# Patient Record
Sex: Male | Born: 1965 | Race: White | Hispanic: No | Marital: Married | Smoking: Former smoker
Health system: Southern US, Community
[De-identification: ages and names within clinical notes are randomized; demographics above are authoritative.]

## PROBLEM LIST (undated history)

## (undated) DIAGNOSIS — K219 Gastro-esophageal reflux disease without esophagitis: Secondary | ICD-10-CM

## (undated) DIAGNOSIS — E785 Hyperlipidemia, unspecified: Secondary | ICD-10-CM

## (undated) DIAGNOSIS — E119 Type 2 diabetes mellitus without complications: Secondary | ICD-10-CM

## (undated) DIAGNOSIS — F988 Other specified behavioral and emotional disorders with onset usually occurring in childhood and adolescence: Secondary | ICD-10-CM

## (undated) DIAGNOSIS — F32A Depression, unspecified: Secondary | ICD-10-CM

## (undated) DIAGNOSIS — E079 Disorder of thyroid, unspecified: Secondary | ICD-10-CM

## (undated) DIAGNOSIS — N189 Chronic kidney disease, unspecified: Secondary | ICD-10-CM

## (undated) DIAGNOSIS — F419 Anxiety disorder, unspecified: Secondary | ICD-10-CM

## (undated) DIAGNOSIS — T7840XA Allergy, unspecified, initial encounter: Secondary | ICD-10-CM

## (undated) DIAGNOSIS — G4730 Sleep apnea, unspecified: Secondary | ICD-10-CM

## (undated) DIAGNOSIS — F329 Major depressive disorder, single episode, unspecified: Secondary | ICD-10-CM

## (undated) DIAGNOSIS — I10 Essential (primary) hypertension: Secondary | ICD-10-CM

## (undated) HISTORY — PX: NECK SURGERY: SHX720

## (undated) HISTORY — DX: Sleep apnea, unspecified: G47.30

## (undated) HISTORY — DX: Type 2 diabetes mellitus without complications: E11.9

## (undated) HISTORY — DX: Disorder of thyroid, unspecified: E07.9

## (undated) HISTORY — DX: Other specified behavioral and emotional disorders with onset usually occurring in childhood and adolescence: F98.8

## (undated) HISTORY — DX: Gastro-esophageal reflux disease without esophagitis: K21.9

## (undated) HISTORY — DX: Hyperlipidemia, unspecified: E78.5

## (undated) HISTORY — DX: Anxiety disorder, unspecified: F41.9

## (undated) HISTORY — PX: NO PAST SURGERIES: SHX2092

## (undated) HISTORY — DX: Allergy, unspecified, initial encounter: T78.40XA

## (undated) HISTORY — DX: Chronic kidney disease, unspecified: N18.9

## (undated) HISTORY — DX: Essential (primary) hypertension: I10

---

## 1999-04-11 ENCOUNTER — Encounter: Admission: RE | Admit: 1999-04-11 | Discharge: 1999-07-10 | Payer: Self-pay | Admitting: Family Medicine

## 2001-06-06 ENCOUNTER — Emergency Department (HOSPITAL_COMMUNITY): Admission: EM | Admit: 2001-06-06 | Discharge: 2001-06-06 | Payer: Self-pay

## 2004-01-04 ENCOUNTER — Ambulatory Visit (HOSPITAL_COMMUNITY): Admission: RE | Admit: 2004-01-04 | Discharge: 2004-01-04 | Payer: Self-pay | Admitting: Gastroenterology

## 2006-11-18 ENCOUNTER — Ambulatory Visit: Payer: Self-pay | Admitting: Family Medicine

## 2007-04-09 ENCOUNTER — Ambulatory Visit: Payer: Self-pay | Admitting: Family Medicine

## 2007-05-26 ENCOUNTER — Ambulatory Visit: Payer: Self-pay | Admitting: Family Medicine

## 2007-07-09 ENCOUNTER — Ambulatory Visit (HOSPITAL_BASED_OUTPATIENT_CLINIC_OR_DEPARTMENT_OTHER): Admission: RE | Admit: 2007-07-09 | Discharge: 2007-07-09 | Payer: Self-pay | Admitting: Family Medicine

## 2007-07-15 ENCOUNTER — Ambulatory Visit: Payer: Self-pay | Admitting: Pulmonary Disease

## 2008-02-16 ENCOUNTER — Ambulatory Visit: Payer: Self-pay | Admitting: Family Medicine

## 2008-05-09 ENCOUNTER — Ambulatory Visit: Payer: Self-pay | Admitting: Family Medicine

## 2008-05-18 ENCOUNTER — Ambulatory Visit: Payer: Self-pay | Admitting: Family Medicine

## 2008-05-20 ENCOUNTER — Ambulatory Visit: Payer: Self-pay | Admitting: Family Medicine

## 2008-07-04 ENCOUNTER — Ambulatory Visit: Payer: Self-pay | Admitting: Family Medicine

## 2008-08-01 ENCOUNTER — Ambulatory Visit: Payer: Self-pay | Admitting: Family Medicine

## 2008-09-05 ENCOUNTER — Ambulatory Visit: Payer: Self-pay | Admitting: Family Medicine

## 2009-04-06 ENCOUNTER — Ambulatory Visit: Payer: Self-pay | Admitting: Family Medicine

## 2009-10-04 ENCOUNTER — Ambulatory Visit: Payer: Self-pay | Admitting: Family Medicine

## 2010-07-23 ENCOUNTER — Ambulatory Visit: Payer: Self-pay | Admitting: Family Medicine

## 2010-07-23 ENCOUNTER — Encounter: Admission: RE | Admit: 2010-07-23 | Discharge: 2010-07-23 | Payer: Self-pay | Admitting: Family Medicine

## 2011-01-15 ENCOUNTER — Ambulatory Visit
Admission: RE | Admit: 2011-01-15 | Discharge: 2011-01-15 | Payer: Self-pay | Source: Home / Self Care | Attending: Family Medicine | Admitting: Family Medicine

## 2011-01-30 ENCOUNTER — Emergency Department (HOSPITAL_COMMUNITY): Payer: No Typology Code available for payment source

## 2011-01-30 ENCOUNTER — Emergency Department (HOSPITAL_COMMUNITY)
Admission: EM | Admit: 2011-01-30 | Discharge: 2011-01-31 | Disposition: A | Discharge status: Home / Self Care | Payer: No Typology Code available for payment source | Attending: Emergency Medicine | Admitting: Emergency Medicine

## 2011-01-30 DIAGNOSIS — S62309A Unspecified fracture of unspecified metacarpal bone, initial encounter for closed fracture: Secondary | ICD-10-CM | POA: Insufficient documentation

## 2011-01-30 DIAGNOSIS — S0990XA Unspecified injury of head, initial encounter: Secondary | ICD-10-CM | POA: Insufficient documentation

## 2011-01-30 DIAGNOSIS — M542 Cervicalgia: Secondary | ICD-10-CM | POA: Insufficient documentation

## 2011-01-30 DIAGNOSIS — S0100XA Unspecified open wound of scalp, initial encounter: Secondary | ICD-10-CM | POA: Insufficient documentation

## 2011-01-30 DIAGNOSIS — T148XXA Other injury of unspecified body region, initial encounter: Secondary | ICD-10-CM | POA: Insufficient documentation

## 2011-01-30 DIAGNOSIS — R51 Headache: Secondary | ICD-10-CM | POA: Insufficient documentation

## 2011-01-30 DIAGNOSIS — W108XXA Fall (on) (from) other stairs and steps, initial encounter: Secondary | ICD-10-CM | POA: Insufficient documentation

## 2011-01-30 DIAGNOSIS — IMO0002 Reserved for concepts with insufficient information to code with codable children: Secondary | ICD-10-CM | POA: Insufficient documentation

## 2011-03-19 ENCOUNTER — Ambulatory Visit (INDEPENDENT_AMBULATORY_CARE_PROVIDER_SITE_OTHER): Payer: No Typology Code available for payment source | Admitting: Family Medicine

## 2011-03-19 DIAGNOSIS — M542 Cervicalgia: Secondary | ICD-10-CM

## 2011-03-21 ENCOUNTER — Ambulatory Visit
Admission: RE | Admit: 2011-03-21 | Discharge: 2011-03-21 | Disposition: A | Discharge status: Home / Self Care | Payer: No Typology Code available for payment source | Source: Ambulatory Visit | Attending: Family Medicine | Admitting: Family Medicine

## 2011-03-21 ENCOUNTER — Other Ambulatory Visit: Payer: Self-pay | Admitting: Family Medicine

## 2011-03-21 DIAGNOSIS — M542 Cervicalgia: Secondary | ICD-10-CM

## 2011-04-16 ENCOUNTER — Ambulatory Visit (INDEPENDENT_AMBULATORY_CARE_PROVIDER_SITE_OTHER): Payer: BC Managed Care – PPO | Admitting: Family Medicine

## 2011-04-16 DIAGNOSIS — Z79899 Other long term (current) drug therapy: Secondary | ICD-10-CM

## 2011-04-16 DIAGNOSIS — I10 Essential (primary) hypertension: Secondary | ICD-10-CM

## 2011-04-16 DIAGNOSIS — Z7189 Other specified counseling: Secondary | ICD-10-CM

## 2011-04-22 ENCOUNTER — Encounter: Payer: Self-pay | Admitting: Family Medicine

## 2011-05-07 NOTE — Procedures (Signed)
[name], Russell Peters              ACCOUNT NO.:  [id number]   MEDICAL RECORD NO.:  [id number]          PATIENT TYPE:  OUT   LOCATION:  SLEEP CENTER                 FACILITY:  [facility]   PHYSICIAN:  Barbaraann Share, MD,FCCPDATE OF BIRTH:  1967/07/27   DATE OF STUDY:  07/09/2007                            NOCTURNAL POLYSOMNOGRAM   REFERRING PHYSICIAN:  Sharlot Gowda, M.D.   INDICATION FOR STUDY:  Hypersomnia with sleep apnea.   EPWORTH SLEEPINESS SCORE:  Epworth score:  21.   SLEEP ARCHITECTURE:  The patient had total sleep time of 422 minutes  with very little slow wave sleep or REM.  Sleep onset latency was normal  and REM onset was very prolonged at 308 minutes.  Sleep efficiency was  surprisingly good at 91%.   RESPIRATORY DATA:  The patient underwent split-night protocol where he  was found to have 200 obstructive events in the first 122 minutes of  sleep.  This gave him an extrapolated apnea/hypopnea index of 98 events  per hour.  The events occurred primarily in the supine position.  There  was loud snoring noted throughout.  By protocol, the patient was placed  on a medium Mirage Quattro full face mask and ultimately titrated to a  final pressure of 13 cm of water with excellent control.   OXYGEN DATA:  There was O2 desaturation as low as 83% with the patient's  obstructive events prior to CPAP initiation.   CARDIAC DATA:  No clinically significant arrhythmias were noted.   MOVEMENT-PARASOMNIA:  None.   IMPRESSIONS-RECOMMENDATIONS:  Very severe obstructive sleep  apnea/hypopnea syndrome with an apnea/hypopnea index with 98 events per  hour and O2 desaturation as low as 83% during the first half of the  night.  The patient was then placed on CPAP with a medium Mirage Quattro  full face mask and ultimately titrated to a final pressure of 13 cm with  excellent control.     Barbaraann Share, MD,FCCP  Diplomate, American Board of Sleep  Medicine  Electronically Signed    [facility]/MEDQ  D:  07/15/2007 17:03:09  T:  07/16/2007 09:16:55  Job:  [id number]

## 2011-05-10 NOTE — Op Note (Signed)
NAME:  Russell Peters                        ACCOUNT NO.:  [id number]   MEDICAL RECORD NO.:  [id number]                   PATIENT TYPE:  AMB   LOCATION:  ENDO                                 FACILITY:  [facility]   PHYSICIAN:  Russell Peters, M.D.                DATE OF BIRTH:  08/04/1966   DATE OF PROCEDURE:  01/04/2004  DATE OF DISCHARGE:                                 OPERATIVE REPORT   PROCEDURE:  Esophagogastroduodenoscopy.   INDICATIONS:  Russell Peters is a 45 year old male, born October 31, 1966.  Russell Peters takes a baby aspirin daily to prevent heart attacks and  strokes.  When he developed heartburn and indigestion, Russell Peters was  evaluated by Dr. Sharlot Peters.  Russell Peters was passing dark stools but by  Dr. Jola Peters exam, his stool was guaiac-negative and his hemoglobin was  measured at 15.1 g.  His complete metabolic profile was normal except for  slight elevation in the liver transaminases.  He was placed on Protonix with  almost complete resolution in his heartburn/indigestion symptoms.  He denies  nausea, vomiting, hematemesis, or hematochezia.   MEDICATION ALLERGIES:  PENICILLIN.   CHRONIC MEDICATIONS:  Wellbutrin, Celexa, Allegra, Adderall, baby aspirin,  antacid p.r.n.   HABITS:  Russell Peters does not smoke cigarettes.  He consumes alcohol in  moderation.   ENDOSCOPIST:  Russell Peters, M.D.   PREMEDICATION:  Versed 10 mg, Demerol 50 mg.   PROCEDURE:  After obtaining informed consent, Russell Peters was placed on the  left lateral decubitus position.  I administered intravenous Demerol and  intravenous Versed to achieve conscious sedation for the procedure.  The  patient's blood pressure, oxygen saturation and cardiac rhythm were  monitored throughout the procedure and documented in the medical record.   The Olympus gastroscope was passed through the posterior hypopharynx down to  the proximal esophagus without difficulty.  The hypopharynx, larynx and  vocal cords appeared normal.   ESOPHAGOSCOPY:  The proximal, mid and lower segments of the esophageal  mucosa appear completely normal.  The squamocolumnar junction and  esophagogastric junction are noted at 45 cm from the incisor teeth.   GASTROSCOPY:  Retroflexed view of the gastric cardia and fundus was normal.  The gastric body, antrum and pylorus appeared normal.   DUODENOSCOPY:  The duodenal bulb, mid duodenum and distal duodenum appeared  completely normal.   ASSESSMENT:  Normal esophagogastroduodenoscopy.   RECOMMENDATIONS:  Discontinue proton pump inhibitor therapy.  I would  recommend not restarting aspirin.  If heartburn and indigestion return,  restart proton pump inhibitor indefinitely to treat gastroesophageal reflux.                                               Russell Peters, M.D.    MJ/MEDQ  D:  01/04/2004  T:  01/04/2004  Job:  [id number]   cc:   Russell Peters, M.D.  [address]  [place], [place] [address]  Fax: [phone]

## 2011-05-16 ENCOUNTER — Ambulatory Visit: Payer: BC Managed Care – PPO | Admitting: Family Medicine

## 2011-05-16 ENCOUNTER — Encounter: Payer: Self-pay | Admitting: Family Medicine

## 2011-05-16 ENCOUNTER — Ambulatory Visit (INDEPENDENT_AMBULATORY_CARE_PROVIDER_SITE_OTHER): Payer: BC Managed Care – PPO | Admitting: Family Medicine

## 2011-05-16 VITALS — BP 140/92 | HR 87 | Wt 281.0 lb

## 2011-05-16 DIAGNOSIS — I152 Hypertension secondary to endocrine disorders: Secondary | ICD-10-CM | POA: Insufficient documentation

## 2011-05-16 DIAGNOSIS — E039 Hypothyroidism, unspecified: Secondary | ICD-10-CM | POA: Insufficient documentation

## 2011-05-16 DIAGNOSIS — E66812 Obesity, class 2: Secondary | ICD-10-CM | POA: Insufficient documentation

## 2011-05-16 DIAGNOSIS — I10 Essential (primary) hypertension: Secondary | ICD-10-CM

## 2011-05-16 DIAGNOSIS — E669 Obesity, unspecified: Secondary | ICD-10-CM

## 2011-05-16 LAB — TSH: TSH: 3.447 u[IU]/mL (ref 0.350–4.500)

## 2011-05-16 MED ORDER — LISINOPRIL-HYDROCHLOROTHIAZIDE 10-12.5 MG PO TABS: 1.0000 | ORAL_TABLET | Freq: Every day | ORAL | Status: DC

## 2011-05-16 NOTE — Progress Notes (Signed)
  Subjective:    Patient ID: Russell Peters, male    DOB: 1966/08/03, 45 y.o.   MRN: [id number]  HPI he is here for medication recheck. In January he was placed on a higher dose of Synthroid. In April he was placed on HCTZ to help with his blood pressure. He hasn't he has noted increase in his urination. He also has concerns over his weight.  He is also have him difficulty with some left arm pain radiating to his fourth and fifth fingers. The MRI did show bilateral changes in C5-6 and C6-7   Review of Systems     Objective:   Physical Exam alert and in no distress. Healing surgical scar noted on the right neck area.        Assessment & Plan:  Hypertension. Hypothyroid. Obesity. Diet and exercise were discussed in detail. He will try an [place] program that will work well with his underlying personality. He does need a program that has written out manuscript he will be able to follow easier because of his underlying ADD I will increase his blood pressure medicine to lisinopril/HCTZ Recheck here in one month. TSH He is to discuss his left arm symptoms with his neurosurgeon

## 2011-05-16 NOTE — Patient Instructions (Signed)
Using information from the Internet to help with the weight. Also increase her physical activity even if this is 5 minutes at a time. Return here in one month for recheck on your blood pressure. I will call in the new blood pressure medicine.

## 2011-05-21 ENCOUNTER — Telehealth: Payer: Self-pay

## 2011-05-21 NOTE — Telephone Encounter (Signed)
Had to call pt on cell he is aware of lab

## 2011-05-21 NOTE — Telephone Encounter (Signed)
Called pt to inform tsh is good continue on present meds

## 2011-05-24 ENCOUNTER — Other Ambulatory Visit: Payer: Self-pay | Admitting: Family Medicine

## 2011-06-24 ENCOUNTER — Ambulatory Visit (INDEPENDENT_AMBULATORY_CARE_PROVIDER_SITE_OTHER): Payer: BC Managed Care – PPO | Admitting: Family Medicine

## 2011-06-24 ENCOUNTER — Encounter: Payer: Self-pay | Admitting: Family Medicine

## 2011-06-24 VITALS — BP 102/76 | HR 78 | Wt 278.0 lb

## 2011-06-24 DIAGNOSIS — N539 Unspecified male sexual dysfunction: Secondary | ICD-10-CM

## 2011-06-24 DIAGNOSIS — F529 Unspecified sexual dysfunction not due to a substance or known physiological condition: Secondary | ICD-10-CM

## 2011-06-24 DIAGNOSIS — I10 Essential (primary) hypertension: Secondary | ICD-10-CM

## 2011-06-24 DIAGNOSIS — M7710 Lateral epicondylitis, unspecified elbow: Secondary | ICD-10-CM

## 2011-06-24 DIAGNOSIS — M7712 Lateral epicondylitis, left elbow: Secondary | ICD-10-CM

## 2011-06-24 NOTE — Patient Instructions (Addendum)
Taper from 40 mg down to 20 mg and then 10 mg every week or 2. They on the Wellbutrin. Get into counseling. Let me know how you are doing in a month or 2 Do as many things that you can palms up and open

## 2011-06-24 NOTE — Progress Notes (Signed)
  Subjective:    Patient ID: Russell Peters, male    DOB: 02-17-1966, 45 y.o.   MRN: [id number]  HPI He is here for recheck on his blood pressure. He has not gotten involved in counseling yet but does state that he is feeling better. He also has concerns over sexual function issues. He notes that he has had difficulty with ejaculation. This has been going on about the same time that he has been on his Celexa. He is also had some recent difficulty with left elbow pain. He especially notes this in the pronated position .  Review of Systems     Objective:   Physical Exam Alert and in no distress. Blood pressure is recorded. Exam of the left elbow does show full motion with slight tenderness over the lateral epicondyle.       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 Hypertension. Lateral epicondylitis. He will slowly taper off Celexa and see how this helps with his sexual function. Discussed proper care of his elbow in terms of hand and wrist position. He will let he know how coming off the Celexa works. He is continue on his blood pressure medicine.

## 2011-12-18 ENCOUNTER — Other Ambulatory Visit: Payer: Self-pay | Admitting: Family Medicine

## 2012-03-24 ENCOUNTER — Encounter: Payer: Self-pay | Admitting: Family Medicine

## 2012-03-24 ENCOUNTER — Ambulatory Visit (INDEPENDENT_AMBULATORY_CARE_PROVIDER_SITE_OTHER): Payer: BC Managed Care – PPO | Admitting: Family Medicine

## 2012-03-24 VITALS — BP 146/96 | HR 90 | Wt 260.0 lb

## 2012-03-24 DIAGNOSIS — M461 Sacroiliitis, not elsewhere classified: Secondary | ICD-10-CM

## 2012-03-24 DIAGNOSIS — N529 Male erectile dysfunction, unspecified: Secondary | ICD-10-CM

## 2012-03-24 MED ORDER — TRAMADOL HCL 50 MG PO TABS: 50.0000 mg | ORAL_TABLET | Freq: Three times a day (TID) | ORAL | Status: AC | PRN

## 2012-03-24 MED ORDER — VARDENAFIL HCL 20 MG PO TABS: 20.0000 mg | ORAL_TABLET | Freq: Every day | ORAL | Status: DC | PRN

## 2012-03-24 NOTE — Patient Instructions (Addendum)
Heat for 20 minutes 3 times per day. Stretch after you apply the heat Continue with ibuprofen 800 mg 3 times per day and give you tramadol. Call me when you need a refill on Levitra

## 2012-03-24 NOTE — Progress Notes (Signed)
  Subjective:    Patient ID: Russell Peters, male    DOB: 22-Sep-1966, 46 y.o.   MRN: [id number]  HPI Approximately 10 days ago he experienced the onset of low back pain while moving a heavy object while at work. Approximately one week ago he had worsening of his symptoms with spasm. He has been using heat alternating with ice as well as ibuprofen and did get better. He reinjured his back while playing basketball 2 days ago and continues to have spasm and pain. No numbness, tingling or weakness. He has been taking 800 mg ibuprofen 3 or 4 times per day. He also has been having difficulty maintaining erections. He has no problems getting started but has had difficulty with ejaculation.   Review of Systems     Objective:   Physical Exam Tender to palpation over the right SI joint. Normal lumbar curve with good motion. Pearlean Brownie testing is positive. DTRs normal.       Assessment & Plan:   1. ED (erectile dysfunction)   2. Sacroiliitis    a sample of Levitra given with instructions on proper use and possible side effects. He will let me know if this works. Also recommend heat for 20 minutes, stretching exercises and I will give tramadol as well as continue him on anti-inflammatory. He will let me know how this works.

## 2012-03-27 ENCOUNTER — Telehealth: Payer: Self-pay | Admitting: Internal Medicine

## 2012-03-27 ENCOUNTER — Telehealth: Payer: Self-pay | Admitting: Family Medicine

## 2012-03-27 ENCOUNTER — Other Ambulatory Visit: Payer: Self-pay | Admitting: Medical

## 2012-03-27 MED ORDER — TRAMADOL HCL 50 MG PO TABS: ORAL_TABLET | ORAL | Status: DC

## 2012-03-27 NOTE — Telephone Encounter (Signed)
i reviewed chart from his recent visit.  I assume this is in regards to the back pain/sacroiliitis.  If so, rest, c/t the antiinflammatory, and he can use 1-2 Ultram every 6 hours prn for pain the next day or 2.  No lifting, just use rest and take it easy over the weekend.  Call report or recheck next week.

## 2012-03-27 NOTE — Telephone Encounter (Signed)
Called pt and advised of Russell Peters's advice.

## 2012-03-27 NOTE — Telephone Encounter (Signed)
Ultram refill sent.  F/u here next week if worse or not much improved

## 2012-03-27 NOTE — Telephone Encounter (Signed)
PT INFORMED OF SHANE'S NOTE & REFILL

## 2012-05-24 ENCOUNTER — Other Ambulatory Visit: Payer: Self-pay | Admitting: Family Medicine

## 2012-06-05 ENCOUNTER — Other Ambulatory Visit: Payer: Self-pay | Admitting: Family Medicine

## 2012-10-20 ENCOUNTER — Encounter: Payer: Self-pay | Admitting: Family Medicine

## 2012-10-20 ENCOUNTER — Ambulatory Visit (INDEPENDENT_AMBULATORY_CARE_PROVIDER_SITE_OTHER): Payer: BC Managed Care – PPO | Admitting: Family Medicine

## 2012-10-20 VITALS — BP 130/90 | HR 87 | Wt 270.0 lb

## 2012-10-20 DIAGNOSIS — E039 Hypothyroidism, unspecified: Secondary | ICD-10-CM

## 2012-10-20 DIAGNOSIS — E669 Obesity, unspecified: Secondary | ICD-10-CM

## 2012-10-20 DIAGNOSIS — G3184 Mild cognitive impairment, so stated: Secondary | ICD-10-CM

## 2012-10-20 LAB — COMPREHENSIVE METABOLIC PANEL
Albumin: 4.3 g/dL (ref 3.5–5.2)
Alkaline Phosphatase: 48 U/L (ref 39–117)
BUN: 14 mg/dL (ref 6–23)
Calcium: 9.7 mg/dL (ref 8.4–10.5)
Chloride: 106 mEq/L (ref 96–112)
Glucose, Bld: 117 mg/dL — ABNORMAL HIGH (ref 70–99)
Potassium: 4.5 mEq/L (ref 3.5–5.3)

## 2012-10-20 LAB — CBC WITH DIFFERENTIAL/PLATELET
Basophils Relative: 0 % (ref 0–1)
HCT: 42.4 % (ref 39.0–52.0)
Hemoglobin: 14.5 g/dL (ref 13.0–17.0)
MCHC: 34.2 g/dL (ref 30.0–36.0)
MCV: 92.2 fL (ref 78.0–100.0)
Monocytes Absolute: 0.7 10*3/uL (ref 0.1–1.0)
Monocytes Relative: 10 % (ref 3–12)
Neutro Abs: 4 10*3/uL (ref 1.7–7.7)

## 2012-10-20 NOTE — Progress Notes (Signed)
  Subjective:    Patient ID: Russell Peters, male    DOB: [birth year], 46 y.o.   MRN: [id number]  HPI Is here for consult concerning cognitive difficulties. He relates inability to add appropriately, difficulty with names, dates and places. He relates one situation where he tried to put a shirt on to his legs. He dates this to a head injury. His wife also mentions paranoid ideation. He relates more difficulty with headaches and does describe 2 incidents of falling over with no good reason. He states that he would like to come off all of his medications.   Review of Systems     Objective:   Physical Exam alert and in no distress. EOMI. DTRs normal. No clonus noted. Tympanic membranes and canals are normal. Throat is clear. Tonsils are normal. Neck is supple without adenopathy or thyromegaly. Cardiac exam shows a regular sinus rhythm without murmurs or gallops. Lungs are clear to auscultation.   Cerebellar testing did show some slight instability but he did not fall in one direction. Finger to nose was slightly off.    Assessment & Plan:   1. Mild cognitive impairment  CBC with Differential, Comprehensive metabolic panel, TSH, CT Head W Contrast  2. Hypothyroid  TSH  3. Obesity     discussed followup concerning this. If his testing all comes out negative. I will have him see his psychiatrist again for discussion on possible stopping medications or switching. May need also look from a neurologic perspective.

## 2012-10-21 ENCOUNTER — Encounter: Payer: Self-pay | Admitting: Family Medicine

## 2012-10-21 NOTE — Addendum Note (Signed)
Addended by: Janeice Robinson on: 10/21/2012 09:52 AM   Modules accepted: Orders

## 2012-10-22 ENCOUNTER — Inpatient Hospital Stay: Admission: RE | Admit: 2012-10-22 | Payer: BC Managed Care – PPO | Source: Ambulatory Visit

## 2012-10-22 ENCOUNTER — Ambulatory Visit
Admission: RE | Admit: 2012-10-22 | Discharge: 2012-10-22 | Disposition: A | Discharge status: Home / Self Care | Payer: BC Managed Care – PPO | Source: Ambulatory Visit | Attending: Family Medicine | Admitting: Family Medicine

## 2012-10-22 DIAGNOSIS — E039 Hypothyroidism, unspecified: Secondary | ICD-10-CM

## 2012-10-22 DIAGNOSIS — G3184 Mild cognitive impairment, so stated: Secondary | ICD-10-CM

## 2012-10-22 MED ORDER — IOHEXOL 300 MG/ML  SOLN
75.0000 mL | Freq: Once | INTRAMUSCULAR | Status: AC | PRN
  Administered 2012-10-22: 75 mL via INTRAVENOUS

## 2012-10-22 NOTE — Progress Notes (Signed)
Quick Note:  I called to let him know the results of the blood work and CT scan. I will await the results of the CPAP readout. If that is normal, I recommended that he talk to his psychiatrist and make sure that the psychiatrist tends me a note. We might need neurologic evaluation. ______

## 2012-10-22 NOTE — Telephone Encounter (Signed)
Thank you I will get to work on this .

## 2012-10-29 ENCOUNTER — Telehealth: Payer: Self-pay

## 2012-10-29 NOTE — Telephone Encounter (Signed)
Opened in error

## 2012-12-24 ENCOUNTER — Telehealth: Payer: Self-pay | Admitting: Family Medicine

## 2012-12-28 ENCOUNTER — Telehealth: Payer: Self-pay | Admitting: Family Medicine

## 2012-12-28 NOTE — Telephone Encounter (Signed)
CALLED [facility]  THEY SAID THEY NEED THE SETTINGS ON RX

## 2012-12-28 NOTE — Telephone Encounter (Signed)
Pt stopped by and stated that he talked to [facility] and they stated that the wrong rx for his cpap machine was sent in . Please send correct rx to [facility].

## 2012-12-30 NOTE — Telephone Encounter (Signed)
tsd  °

## 2013-02-01 ENCOUNTER — Other Ambulatory Visit: Payer: Self-pay | Admitting: Family Medicine

## 2013-02-06 ENCOUNTER — Other Ambulatory Visit: Payer: Self-pay

## 2013-03-29 ENCOUNTER — Other Ambulatory Visit: Payer: Self-pay | Admitting: Family Medicine

## 2013-05-20 ENCOUNTER — Telehealth: Payer: Self-pay | Admitting: Family Medicine

## 2013-05-20 MED ORDER — SYNTHROID 88 MCG PO TABS: ORAL_TABLET | ORAL | Status: DC

## 2013-05-20 NOTE — Telephone Encounter (Signed)
SENT IN [facility] MED PER FAX

## 2013-05-27 ENCOUNTER — Other Ambulatory Visit: Payer: Self-pay | Admitting: Family Medicine

## 2013-07-15 ENCOUNTER — Telehealth: Payer: Self-pay | Admitting: Internal Medicine

## 2013-07-15 MED ORDER — SYNTHROID 88 MCG PO TABS: ORAL_TABLET | ORAL | Status: DC

## 2013-07-15 NOTE — Telephone Encounter (Signed)
Done

## 2013-07-15 NOTE — Telephone Encounter (Signed)
Refill request for synthroid 88mcg to [facility] 

## 2013-07-21 ENCOUNTER — Encounter: Payer: Self-pay | Admitting: Family Medicine

## 2013-07-21 ENCOUNTER — Ambulatory Visit (INDEPENDENT_AMBULATORY_CARE_PROVIDER_SITE_OTHER): Payer: BC Managed Care – PPO | Admitting: Family Medicine

## 2013-07-21 VITALS — BP 130/82 | HR 99 | Wt 268.0 lb

## 2013-07-21 DIAGNOSIS — L919 Hypertrophic disorder of the skin, unspecified: Secondary | ICD-10-CM

## 2013-07-21 DIAGNOSIS — L918 Other hypertrophic disorders of the skin: Secondary | ICD-10-CM

## 2013-07-21 DIAGNOSIS — L909 Atrophic disorder of skin, unspecified: Secondary | ICD-10-CM

## 2013-07-21 MED ORDER — LIDOCAINE-EPINEPHRINE 2 %-1:100000 IJ SOLN
0.2000 mL | Freq: Once | INTRAMUSCULAR | Status: AC
  Administered 2013-07-21: 0.2 mL via INTRADERMAL

## 2013-07-21 NOTE — Addendum Note (Signed)
Addended by: Lavell Islam on: 07/21/2013 10:15 AM   Modules accepted: Orders

## 2013-07-21 NOTE — Progress Notes (Signed)
  Subjective:    Patient ID: Russell Peters, male    DOB: 1966-05-21, 47 y.o.   MRN: [id number]  HPI He has 2 skin tags present, one on each inner thigh that are now becoming irritated with wearing clothes and he would like them removed.  Review of Systems     Objective:   Physical Exam The left skin tag is approximately 0.5 cm in size and the one on the right is slightly smaller.       Assessment & Plan:  Cutaneous skin tags  lesions were injected with Xylocaine and epinephrine, excised and the base cauterized with silver nitrate.

## 2013-10-09 ENCOUNTER — Other Ambulatory Visit: Payer: Self-pay | Admitting: Family Medicine

## 2013-10-28 ENCOUNTER — Other Ambulatory Visit: Payer: Self-pay

## 2013-11-22 ENCOUNTER — Ambulatory Visit (INDEPENDENT_AMBULATORY_CARE_PROVIDER_SITE_OTHER): Payer: BC Managed Care – PPO | Admitting: Family Medicine

## 2013-11-22 ENCOUNTER — Encounter: Payer: Self-pay | Admitting: Family Medicine

## 2013-11-22 VITALS — BP 130/90 | HR 119 | Temp 98.2°F | Wt 264.0 lb

## 2013-11-22 DIAGNOSIS — J209 Acute bronchitis, unspecified: Secondary | ICD-10-CM

## 2013-11-22 MED ORDER — AZITHROMYCIN 500 MG PO TABS: 500.0000 mg | ORAL_TABLET | Freq: Every day | ORAL | Status: DC

## 2013-11-22 NOTE — Progress Notes (Signed)
Teaching Physician: Sharlot Gowda, MD Dictated By: Judithann Graves  Subjective:  Russell Peters is a 47 y.o. male who presents for evaluation of 8 days of cough, sinus congestion, and headache. The cough was initially dry, becoming productive of yellow-green sputum on the 4th day of his illness. It is occasionally blood-tinged. The cough is worse at night and lately only achieving about 2 hours of sleep each night. He has tried a cough suppressant and Tylenol Cold Extreme with only minimal symptomatic relief. Of note, he also describes some blisters that he noticed on his tongue when this episode began but they have since resolved. He believes that his cough is not improving. He has not had a fever but he continues to have chills at night. He has no known sick contacts, chest pain, SOB, wheezing, earache, sore throat. He is a present nonsmoker with 8-9 pack-year smoking history, having quit 15 years ago.  ROS as in subjective.  Objective: Filed Vitals:   11/22/13 1055  BP: 130/90  Pulse: 119  Temp: 98.2 F (36.8 C)    Physical Exam:  General: Alert and in no distress  HEENT: Tympanic membranes and canals are normal. Throat is clear. Tonsils are normal.  Neck: Supple without adenopathy or thyromegaly CV: Tachycardic, Regular sinus rhythm without murmurs or gallops  Puml: Lungs are clear to auscultation, no wheezing, crackles, or egophony  Assessment and Plan: 1. Acute bronchitis Given protracted course, concern for bacterial process. Prescribing 3 days of azithromycin, and encouraged Mr. Dougher to use Nyquil before bedtime for night-time symptomatic relief.  - azithromycin (ZITHROMAX) 500 MG tablet; Take 1 tablet (500 mg total) by mouth daily.  Dispense: 3 tablet; Refill: 0   Dr. Susann Givens was present for the encounter and agrees with the above assessment and plan.

## 2013-11-30 ENCOUNTER — Other Ambulatory Visit: Payer: Self-pay | Admitting: Family Medicine

## 2013-11-30 ENCOUNTER — Encounter: Payer: Self-pay | Admitting: Family Medicine

## 2013-11-30 ENCOUNTER — Ambulatory Visit (INDEPENDENT_AMBULATORY_CARE_PROVIDER_SITE_OTHER): Payer: BC Managed Care – PPO | Admitting: Family Medicine

## 2013-11-30 ENCOUNTER — Ambulatory Visit
Admission: RE | Admit: 2013-11-30 | Discharge: 2013-11-30 | Disposition: A | Discharge status: Home / Self Care | Payer: BC Managed Care – PPO | Source: Ambulatory Visit | Attending: Family Medicine | Admitting: Family Medicine

## 2013-11-30 VITALS — BP 120/80 | HR 101 | Temp 98.4°F | Wt 264.0 lb

## 2013-11-30 DIAGNOSIS — Z8249 Family history of ischemic heart disease and other diseases of the circulatory system: Secondary | ICD-10-CM

## 2013-11-30 DIAGNOSIS — R0989 Other specified symptoms and signs involving the circulatory and respiratory systems: Secondary | ICD-10-CM

## 2013-11-30 DIAGNOSIS — Z833 Family history of diabetes mellitus: Secondary | ICD-10-CM

## 2013-11-30 DIAGNOSIS — J209 Acute bronchitis, unspecified: Secondary | ICD-10-CM

## 2013-11-30 DIAGNOSIS — R0600 Dyspnea, unspecified: Secondary | ICD-10-CM

## 2013-11-30 DIAGNOSIS — R0609 Other forms of dyspnea: Secondary | ICD-10-CM

## 2013-11-30 LAB — COMPREHENSIVE METABOLIC PANEL
ALT: 27 U/L (ref 0–53)
CO2: 28 mEq/L (ref 19–32)
Calcium: 9.8 mg/dL (ref 8.4–10.5)
Chloride: 102 mEq/L (ref 96–112)
Sodium: 138 mEq/L (ref 135–145)
Total Protein: 6.5 g/dL (ref 6.0–8.3)

## 2013-11-30 LAB — LIPID PANEL
Cholesterol: 200 mg/dL (ref 0–200)
VLDL: 43 mg/dL — ABNORMAL HIGH (ref 0–40)

## 2013-11-30 NOTE — Progress Notes (Signed)
   Subjective:    Patient ID: Russell Peters, male    DOB: 1966/11/17, 47 y.o.   MRN: [id number]  HPI He is here for recheck. He was seen December 1 and treated with azithromycin. He states that his cough and congestion are better however he still has a hoarse voice and he does complain of dyspnea on exertion. He continues on his CPAP machine. He has no PND or edema. He also notes increased fatigue and excessive sleeping. He states that the shortness of breath seems to be more of an issue now than when he was having difficulty with the bronchitis symptoms.   Review of Systems     Objective:   Physical Exam alert and in no distress. Tympanic membranes and canals are normal. Throat is clear. Tonsils are normal. Neck is supple without adenopathy or thyromegaly. Cardiac exam shows a regular sinus rhythm without murmurs or gallops. Lungs are clear to auscultation. And lower extremity exam shows no edema. EKG shows no acute changes.       Assessment & Plan:  Family history of heart disease in male family member before age 17 - Plan: CBC with Differential, Comprehensive metabolic panel, Lipid panel, EKG 12-Lead, DG Chest 2 View, Brain natriuretic peptide  DOE (dyspnea on exertion) - Plan: CBC with Differential, Comprehensive metabolic panel, Lipid panel, EKG 12-Lead, DG Chest 2 View, Brain natriuretic peptide  Acute bronchitis - Plan: DG Chest 2 View  Family history of diabetes mellitus  chest x-ray is negative. I will wait till the blood work comes in to decide further therapy.

## 2013-12-01 LAB — HEMOGLOBIN A1C
Hgb A1c MFr Bld: 6.1 % — ABNORMAL HIGH (ref <5.7)
Mean Plasma Glucose: 128 mg/dL — ABNORMAL HIGH (ref <117)

## 2013-12-01 LAB — CBC WITH DIFFERENTIAL/PLATELET
Lymphocytes Relative: 30 % (ref 12–46)
Lymphs Abs: 2.7 10*3/uL (ref 0.7–4.0)
Neutrophils Relative %: 61 % (ref 43–77)
Platelets: 337 10*3/uL (ref 150–400)
RBC: 4.96 MIL/uL (ref 4.22–5.81)
WBC: 9 10*3/uL (ref 4.0–10.5)

## 2013-12-02 MED ORDER — AZITHROMYCIN 500 MG PO TABS: 500.0000 mg | ORAL_TABLET | Freq: Every day | ORAL | Status: DC

## 2013-12-02 NOTE — Addendum Note (Signed)
Addended by: Ronnald Nian on: 12/02/2013 06:45 AM   Modules accepted: Orders

## 2014-01-10 ENCOUNTER — Telehealth: Payer: Self-pay | Admitting: Internal Medicine

## 2014-01-10 MED ORDER — SYNTHROID 88 MCG PO TABS: 88.0000 ug | ORAL_TABLET | Freq: Every day | ORAL | Status: DC

## 2014-01-10 MED ORDER — SYNTHROID 88 MCG PO TABS: ORAL_TABLET | ORAL | Status: DC

## 2014-01-10 NOTE — Telephone Encounter (Signed)
Refill request for synthroid 70mcg to [facility]

## 2014-01-10 NOTE — Telephone Encounter (Signed)
He will need followup blood work.

## 2014-01-10 NOTE — Telephone Encounter (Signed)
Let him know that he needs blood work and that I called his thyroid medication in.

## 2014-01-10 NOTE — Telephone Encounter (Signed)
Unable to leave vm.

## 2014-01-10 NOTE — Telephone Encounter (Signed)
Rx sent in to pharm 

## 2014-01-18 NOTE — Telephone Encounter (Signed)
Unable to leave message. Phone just rang.

## 2014-03-07 ENCOUNTER — Ambulatory Visit (INDEPENDENT_AMBULATORY_CARE_PROVIDER_SITE_OTHER): Payer: BC Managed Care – PPO | Admitting: Family Medicine

## 2014-03-07 ENCOUNTER — Encounter: Payer: Self-pay | Admitting: Family Medicine

## 2014-03-07 VITALS — BP 140/92 | HR 84 | Wt 274.0 lb

## 2014-03-07 DIAGNOSIS — L259 Unspecified contact dermatitis, unspecified cause: Secondary | ICD-10-CM

## 2014-03-07 DIAGNOSIS — L309 Dermatitis, unspecified: Secondary | ICD-10-CM

## 2014-03-07 MED ORDER — TRIAMCINOLONE ACETONIDE 0.5 % EX CREA: 1.0000 "application " | TOPICAL_CREAM | Freq: Three times a day (TID) | CUTANEOUS | Status: DC

## 2014-03-07 NOTE — Progress Notes (Signed)
   Subjective:    Patient ID: Russell Peters, male    DOB: 1966/09/13, 48 y.o.   MRN: [id number]  HPI He is here for evaluation of a rash present on both sides of his lower neck. The only risks if he can take a visit he is washing his under clothes with extra bleach to keep them white. He also complains of intermittent difficulty with swelling in the canthus of both eyes with occasional bleeding. He has not followed up with his ophthalmologist yet.   Review of Systems     Objective:   Physical Exam Alert and in no distress. Exam of the lower neck bilaterally does show linear erythematous lesions. Exam of his eyelids shows no lesions.       Assessment & Plan:  Dermatitis - Plan: triamcinolone cream (KENALOG) 0.5 %  He is to use the Kenalog regularly for at least 3 weeks and if no improvement I will probably refer him. Recommend he followup with his ophthalmologist.

## 2014-04-18 ENCOUNTER — Other Ambulatory Visit: Payer: Self-pay | Admitting: Family Medicine

## 2014-05-18 ENCOUNTER — Other Ambulatory Visit: Payer: Self-pay | Admitting: Family Medicine

## 2014-07-05 ENCOUNTER — Other Ambulatory Visit: Payer: Self-pay | Admitting: Family Medicine

## 2014-08-14 ENCOUNTER — Emergency Department (HOSPITAL_BASED_OUTPATIENT_CLINIC_OR_DEPARTMENT_OTHER): Payer: BC Managed Care – PPO

## 2014-08-14 ENCOUNTER — Encounter (HOSPITAL_BASED_OUTPATIENT_CLINIC_OR_DEPARTMENT_OTHER): Payer: Self-pay | Admitting: Emergency Medicine

## 2014-08-14 ENCOUNTER — Emergency Department (HOSPITAL_BASED_OUTPATIENT_CLINIC_OR_DEPARTMENT_OTHER)
Admission: EM | Admit: 2014-08-14 | Discharge: 2014-08-14 | Disposition: A | Discharge status: Home / Self Care | Payer: BC Managed Care – PPO | Attending: Emergency Medicine | Admitting: Emergency Medicine

## 2014-08-14 DIAGNOSIS — Z87891 Personal history of nicotine dependence: Secondary | ICD-10-CM | POA: Diagnosis not present

## 2014-08-14 DIAGNOSIS — Y929 Unspecified place or not applicable: Secondary | ICD-10-CM | POA: Diagnosis not present

## 2014-08-14 DIAGNOSIS — S6990XA Unspecified injury of unspecified wrist, hand and finger(s), initial encounter: Secondary | ICD-10-CM | POA: Insufficient documentation

## 2014-08-14 DIAGNOSIS — Y9389 Activity, other specified: Secondary | ICD-10-CM | POA: Insufficient documentation

## 2014-08-14 DIAGNOSIS — K219 Gastro-esophageal reflux disease without esophagitis: Secondary | ICD-10-CM | POA: Diagnosis not present

## 2014-08-14 DIAGNOSIS — Z880 Allergy status to penicillin: Secondary | ICD-10-CM | POA: Insufficient documentation

## 2014-08-14 DIAGNOSIS — Z79899 Other long term (current) drug therapy: Secondary | ICD-10-CM | POA: Insufficient documentation

## 2014-08-14 DIAGNOSIS — E079 Disorder of thyroid, unspecified: Secondary | ICD-10-CM | POA: Insufficient documentation

## 2014-08-14 DIAGNOSIS — F988 Other specified behavioral and emotional disorders with onset usually occurring in childhood and adolescence: Secondary | ICD-10-CM | POA: Diagnosis not present

## 2014-08-14 DIAGNOSIS — IMO0002 Reserved for concepts with insufficient information to code with codable children: Secondary | ICD-10-CM | POA: Diagnosis not present

## 2014-08-14 DIAGNOSIS — I10 Essential (primary) hypertension: Secondary | ICD-10-CM | POA: Diagnosis not present

## 2014-08-14 DIAGNOSIS — S6992XA Unspecified injury of left wrist, hand and finger(s), initial encounter: Secondary | ICD-10-CM

## 2014-08-14 MED ORDER — HYDROCODONE-ACETAMINOPHEN 5-325 MG PO TABS: 1.0000 | ORAL_TABLET | ORAL | Status: DC | PRN

## 2014-08-14 NOTE — ED Provider Notes (Signed)
CSN: [id number]     Arrival date & time 08/14/14  1657 History   First MD Initiated Contact with Patient 08/14/14 1742     Chief Complaint  Patient presents with  . Hand Pain     (Consider location/radiation/quality/duration/timing/severity/associated sxs/prior Treatment) Patient is a 48 y.o. male presenting with hand pain. The history is provided by the patient. No language interpreter was used.  Hand Pain Pertinent negatives include no chills, fever or numbness. Associated symptoms comments: He reports he hit a wall with his left hand 2 days ago when angry and presents for persistent pain and swelling. No other injury. .    Past Medical History  Diagnosis Date  . Allergy   . ADD (attention deficit disorder)   . Hypertension   . GERD (gastroesophageal reflux disease)   . Thyroid disease   . Sleep apnea    History reviewed. No pertinent past surgical history. No family history on file. History  Substance Use Topics  . Smoking status: Former Smoker    Quit date: 12/23/1998  . Smokeless tobacco: Never Used  . Alcohol Use: Not on file    Review of Systems  Constitutional: Negative for fever and chills.  Gastrointestinal: Negative.   Musculoskeletal:       See HPI  Skin: Negative.  Negative for wound.  Neurological: Negative.  Negative for numbness.      Allergies  Penicillins  Home Medications   Prior to Admission medications   Medication Sig Start Date End Date Taking? Authorizing Provider  amphetamine-dextroamphetamine (ADDERALL XR) 25 MG 24 hr capsule Take 25 mg by mouth 3 (three) times daily.      Historical Provider, MD  buPROPion (WELLBUTRIN XL) 300 MG 24 hr tablet Take 300 mg by mouth daily.    Historical Provider, MD  citalopram (CELEXA) 40 MG tablet Take 40 mg by mouth daily.      Historical Provider, MD  HYDROcodone-acetaminophen (NORCO/VICODIN) 5-325 MG per tablet Take 1-2 tablets by mouth every 4 (four) hours as needed. 08/14/14   Carder Yin A Jamison Yuhasz, PA-C   lisinopril-hydrochlorothiazide (PRINZIDE,ZESTORETIC) 10-12.5 MG per tablet TAKE 1 TABLET BY MOUTH ONCE DAILY. 05/18/14   Denita Lung, MD  omeprazole (PRILOSEC) 20 MG capsule Take 20 mg by mouth daily.      Historical Provider, MD  SYNTHROID 88 MCG tablet Take 1 tablet (88 mcg total) by mouth daily. 01/10/14   Denita Lung, MD  SYNTHROID 88 MCG tablet TAKE 1 TABLET BY MOUTH ONCE DAILY. 07/05/14   Denita Lung, MD  triamcinolone cream (KENALOG) 0.5 % Apply 1 application topically 3 (three) times daily. 03/07/14   Denita Lung, MD  vardenafil (LEVITRA) 20 MG tablet Take 1 tablet (20 mg total) by mouth daily as needed for erectile dysfunction. 03/24/12 04/23/12  Denita Lung, MD   BP 145/92  Pulse 81  Temp(Src) 98.2 F (36.8 C) (Oral)  Resp 22  Ht 5\' 11"  (1.803 m)  Wt 268 lb (121.564 kg)  BMI 37.39 kg/m2  SpO2 99% Physical Exam  Constitutional: He is oriented to person, place, and time. He appears well-developed and well-nourished. No distress.  Musculoskeletal:  Left hand moderately swollen dorsally without wound or significant bruising. Tender over dorsal proximal hand at wrist. FROM all digits, including wrist, without deficit.  Neurological: He is alert and oriented to person, place, and time.  Skin: Skin is warm and dry. No erythema.  Psychiatric: He has a normal mood and affect.    ED  Course  Procedures (including critical care time) Labs Review Labs Reviewed - No data to display  Imaging Review Dg Hand Complete Left  08/14/2014   CLINICAL DATA:  Left hand pain and swelling following injury 2 days ago.  EXAM: LEFT HAND - COMPLETE 3+ VIEW  COMPARISON:  None.  FINDINGS: There is a questionable fracture involving the base of the fifth metacarpal or triquetrum. Because this overlaps with the fourth metacarpal base and triquetrum, this is not definite, but there is prominent dorsal soft tissue swelling throughout the hand. The fifth metacarpal neck is intact. No other acute osseous  findings are seen.  IMPRESSION: Prominent dorsal soft tissue swelling throughout the hand. Cannot exclude a fracture involving the fifth metacarpal base or triquetrum.   Electronically Signed   By: Camie Patience M.D.   On: 08/14/2014 17:34     EKG Interpretation None      MDM   Final diagnoses:  Hand injury, left, initial encounter    Will splint as x-ray is indeterminate regarding fracture of 4th MC base. Refer to family hand ortho Caralyn Guile). Pain management provided.    Dewaine Oats, PA-C 08/14/14 1848

## 2014-08-14 NOTE — Discharge Instructions (Signed)
Splint Care Splints protect and rest injuries. Splints can be made of plaster, fiberglass, or metal. They are used to treat broken bones, sprains, tendonitis, and other injuries. HOME CARE  Keep the injured area raised (elevated) while sitting or lying down. Keep the injured body part just above the level of the heart. This will decrease puffiness (swelling) and pain.  If an elastic bandage was used to hold the splint, it can be loosened. Only loosen it to make room for puffiness and to ease pain.  Keep the splint clean and dry.  Do not scratch the skin under the splint with sharp or pointed objects.  Follow up with your doctor as told. GET HELP RIGHT AWAY IF:   There is more pain or pressure around the injury.  There is numbness, tingling, or pain in the toes or fingers past the injury.  The fingers or toes become cold or blue.  The splint becomes too soft or breaks before the injury is healed. MAKE SURE YOU:   Understand these instructions.  Will watch this condition.  Will get help right away if you are not doing well or get worse. Document Released: 09/17/2008 Document Revised: 03/02/2012 Document Reviewed: 09/17/2008 [facility] Patient Information 2015 [place], [place]. This information is not intended to replace advice given to you by your health care provider. Make sure you discuss any questions you have with your health care provider. Cryotherapy Cryotherapy means treatment with cold. Ice or gel packs can be used to reduce both pain and swelling. Ice is the most helpful within the first 24 to 48 hours after an injury or flare-up from overusing a muscle or joint. Sprains, strains, spasms, burning pain, shooting pain, and aches can all be eased with ice. Ice can also be used when recovering from surgery. Ice is effective, has very few side effects, and is safe for most people to use. PRECAUTIONS  Ice is not a safe treatment option for people with:  Raynaud phenomenon. This is a  condition affecting small blood vessels in the extremities. Exposure to cold may cause your problems to return.  Cold hypersensitivity. There are many forms of cold hypersensitivity, including:  Cold urticaria. Red, itchy hives appear on the skin when the tissues begin to warm after being iced.  Cold erythema. This is a red, itchy rash caused by exposure to cold.  Cold hemoglobinuria. Red blood cells break down when the tissues begin to warm after being iced. The hemoglobin that carry oxygen are passed into the urine because they cannot combine with blood proteins fast enough.  Numbness or altered sensitivity in the area being iced. If you have any of the following conditions, do not use ice until you have discussed cryotherapy with your caregiver:  Heart conditions, such as arrhythmia, angina, or chronic heart disease.  High blood pressure.  Healing wounds or open skin in the area being iced.  Current infections.  Rheumatoid arthritis.  Poor circulation.  Diabetes. Ice slows the blood flow in the region it is applied. This is beneficial when trying to stop inflamed tissues from spreading irritating chemicals to surrounding tissues. However, if you expose your skin to cold temperatures for too long or without the proper protection, you can damage your skin or nerves. Watch for signs of skin damage due to cold. HOME CARE INSTRUCTIONS Follow these tips to use ice and cold packs safely.  Place a dry or damp towel between the ice and skin. A damp towel will cool the skin more  quickly, so you may need to shorten the time that the ice is used.  For a more rapid response, add gentle compression to the ice.  Ice for no more than 10 to 20 minutes at a time. The bonier the area you are icing, the less time it will take to get the benefits of ice.  Check your skin after 5 minutes to make sure there are no signs of a poor response to cold or skin damage.  Rest 20 minutes or more between  uses.  Once your skin is numb, you can end your treatment. You can test numbness by very lightly touching your skin. The touch should be so light that you do not see the skin dimple from the pressure of your fingertip. When using ice, most people will feel these normal sensations in this order: cold, burning, aching, and numbness.  Do not use ice on someone who cannot communicate their responses to pain, such as small children or people with dementia. HOW TO MAKE AN ICE PACK Ice packs are the most common way to use ice therapy. Other methods include ice massage, ice baths, and cryosprays. Muscle creams that cause a cold, tingly feeling do not offer the same benefits that ice offers and should not be used as a substitute unless recommended by your caregiver. To make an ice pack, do one of the following:  Place crushed ice or a bag of frozen vegetables in a sealable plastic bag. Squeeze out the excess air. Place this bag inside another plastic bag. Slide the bag into a pillowcase or place a damp towel between your skin and the bag.  Mix 3 parts water with 1 part rubbing alcohol. Freeze the mixture in a sealable plastic bag. When you remove the mixture from the freezer, it will be slushy. Squeeze out the excess air. Place this bag inside another plastic bag. Slide the bag into a pillowcase or place a damp towel between your skin and the bag. SEEK MEDICAL CARE IF:  You develop white spots on your skin. This may give the skin a blotchy (mottled) appearance.  Your skin turns blue or pale.  Your skin becomes waxy or hard.  Your swelling gets worse. MAKE SURE YOU:   Understand these instructions.  Will watch your condition.  Will get help right away if you are not doing well or get worse. Document Released: 08/05/2011 Document Revised: 04/25/2014 Document Reviewed: 08/05/2011 [facility] Patient Information 2015 [place], [place]. This information is not intended to replace advice given to you by  your health care provider. Make sure you discuss any questions you have with your health care provider.

## 2014-08-14 NOTE — ED Notes (Signed)
PT discharged to home with family. NAD. 

## 2014-08-14 NOTE — ED Notes (Signed)
Pt here with pain and swelling of left hand since he punched a wall on Friday.

## 2014-08-15 ENCOUNTER — Telehealth: Payer: Self-pay | Admitting: Internal Medicine

## 2014-08-15 ENCOUNTER — Other Ambulatory Visit: Payer: Self-pay

## 2014-08-15 MED ORDER — LISINOPRIL-HYDROCHLOROTHIAZIDE 10-12.5 MG PO TABS: ORAL_TABLET | ORAL | Status: DC

## 2014-08-15 NOTE — Telephone Encounter (Signed)
Refill request for lisinopril-hctz 10-12.5mg  #30 to [facility]

## 2014-08-15 NOTE — Telephone Encounter (Signed)
DONE

## 2014-08-16 NOTE — ED Provider Notes (Signed)
Medical screening examination/treatment/procedure(s) were performed by non-physician practitioner and as supervising physician I was immediately available for consultation/collaboration.   EKG Interpretation None        Tanna Furry, MD 08/16/14 [phone]

## 2014-09-05 ENCOUNTER — Telehealth: Payer: Self-pay | Admitting: Family Medicine

## 2014-09-05 ENCOUNTER — Other Ambulatory Visit: Payer: Self-pay

## 2014-09-05 MED ORDER — SYNTHROID 88 MCG PO TABS: ORAL_TABLET | ORAL | Status: DC

## 2014-09-05 NOTE — Telephone Encounter (Signed)
DONE

## 2014-11-16 ENCOUNTER — Other Ambulatory Visit: Payer: Self-pay | Admitting: Family Medicine

## 2014-11-30 ENCOUNTER — Telehealth: Payer: Self-pay | Admitting: Family Medicine

## 2014-11-30 NOTE — Telephone Encounter (Signed)
Pt called and asked if you will call him in another anti inflamatory for his tight back to [employer].  Pt can be reached [phone]

## 2014-12-09 IMAGING — CR DG HAND COMPLETE 3+V*L*
3 series · 3 of 3 positions shown · non-contrast
Comparison: None.

CLINICAL DATA: Left hand pain and swelling following injury 2 days
ago.

EXAM:
LEFT HAND - COMPLETE 3+ VIEW

[x hand pa left]
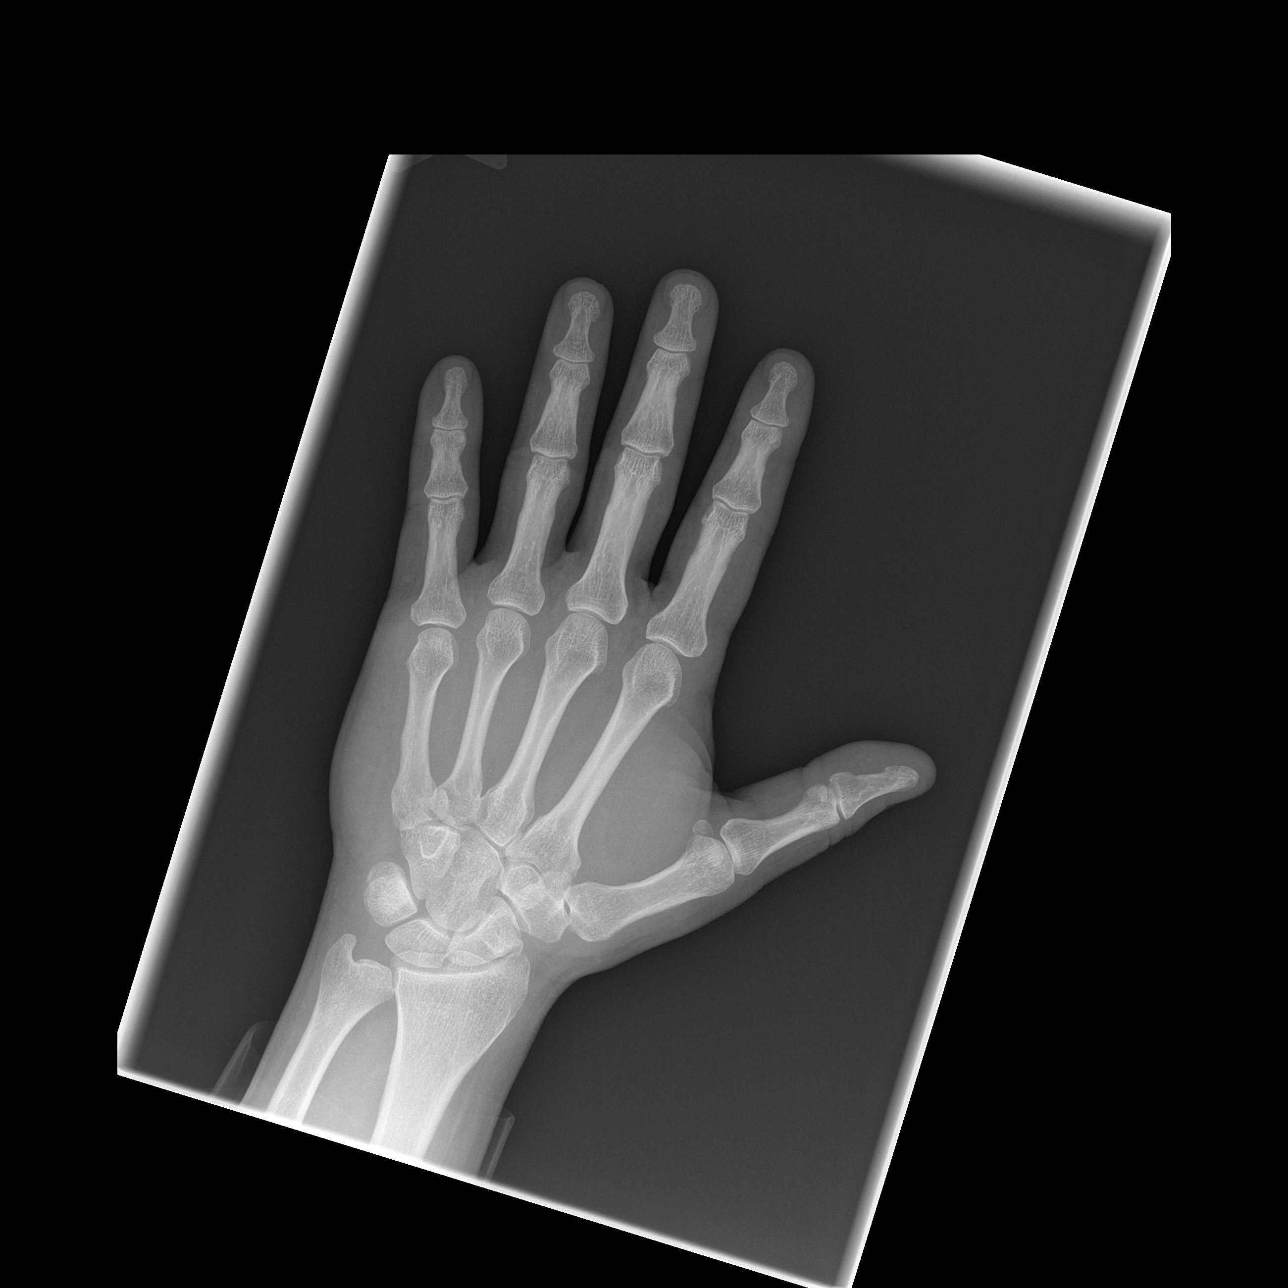

[x hand oblique left]
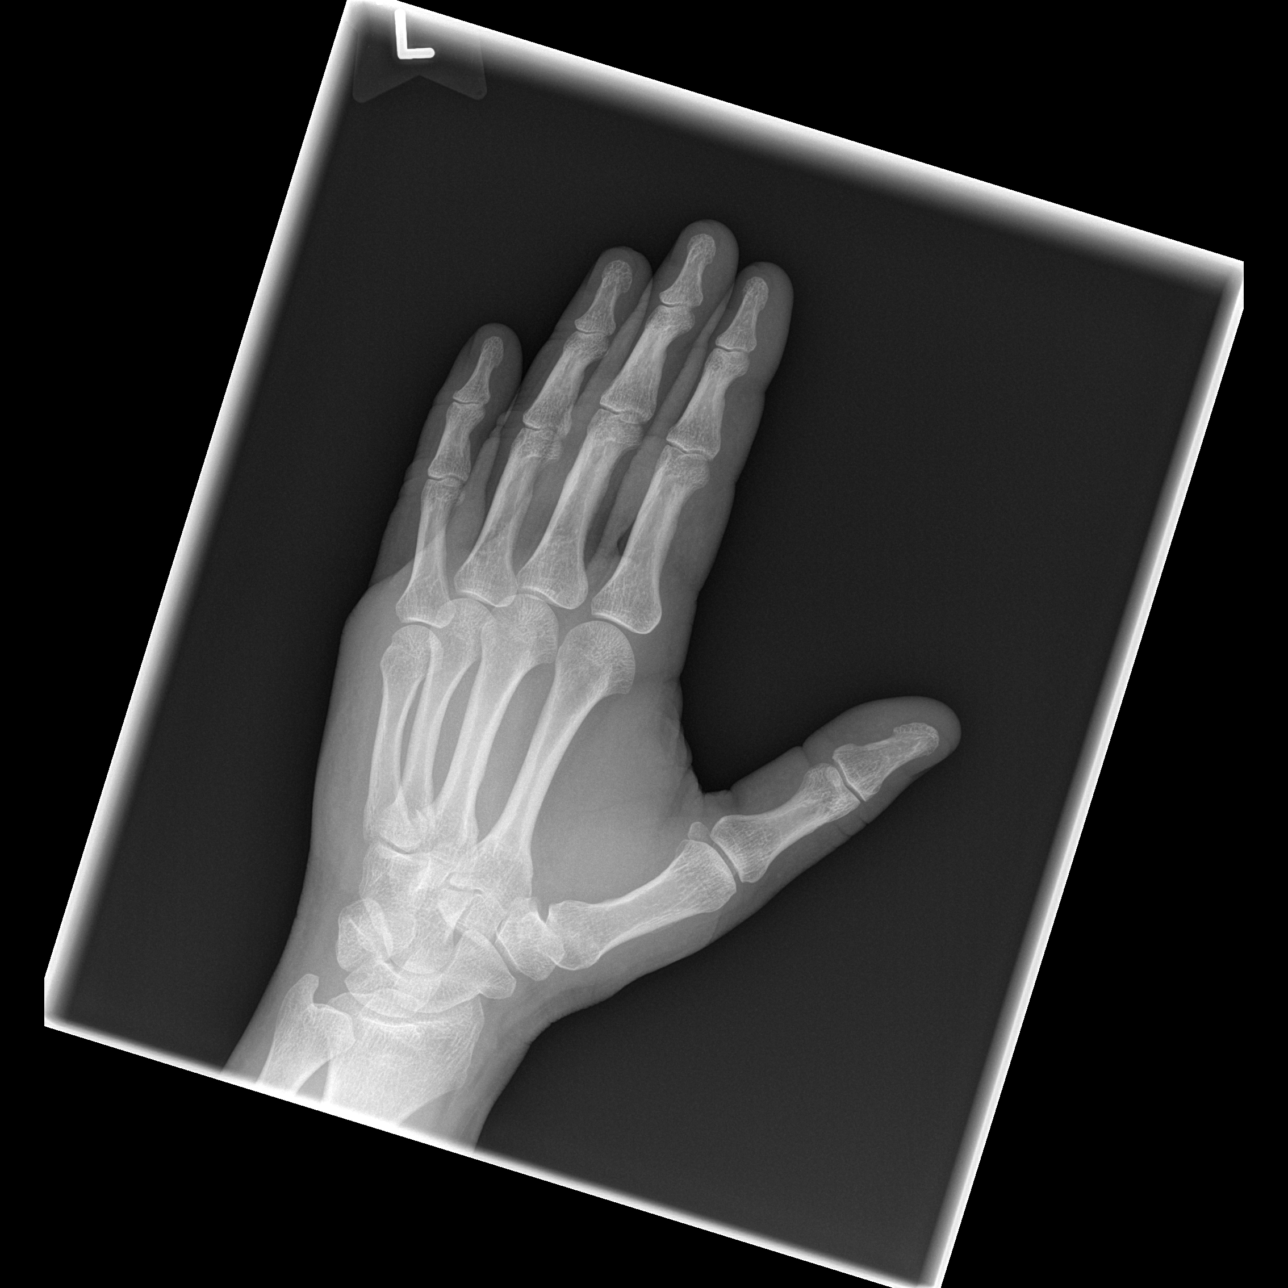

[x hand lat left]
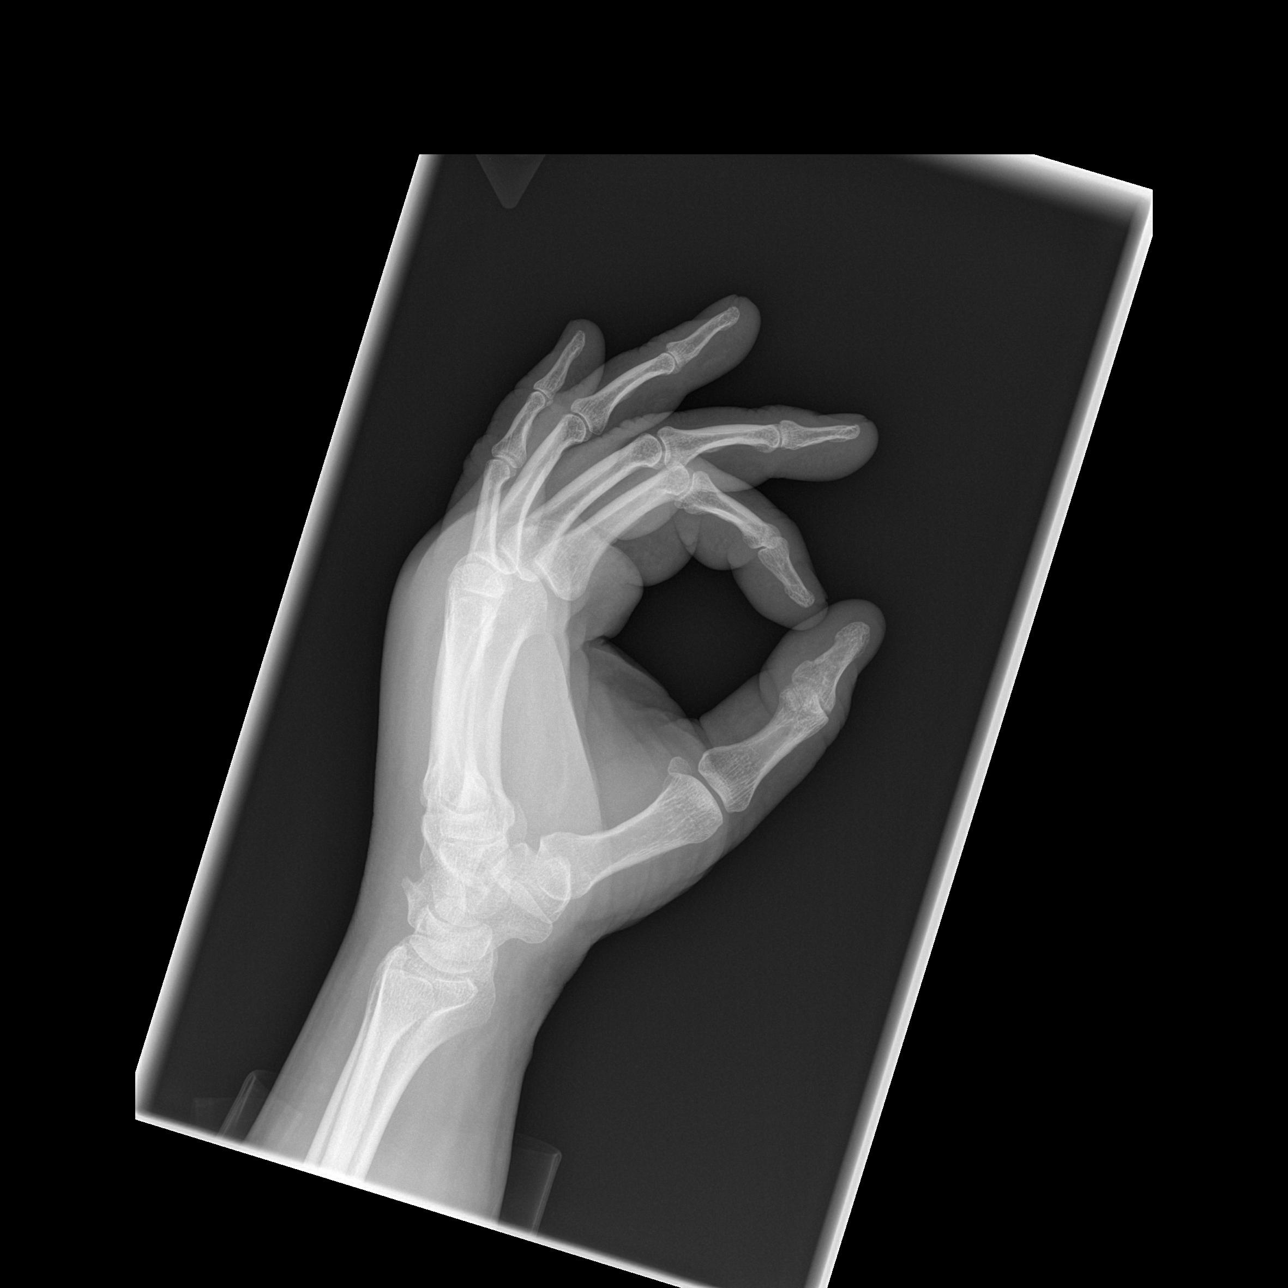

[3 of 3 positions shown; findings below may reference images not displayed]

FINDINGS: There is a questionable fracture involving the base of the fifth
metacarpal or triquetrum. Because this overlaps with the fourth
metacarpal base and triquetrum, this is not definite, but there is
prominent dorsal soft tissue swelling throughout the hand. The fifth
metacarpal neck is intact. No other acute osseous findings are seen.
IMPRESSION: Prominent dorsal soft tissue swelling throughout the hand. Cannot
exclude a fracture involving the fifth metacarpal base or
triquetrum.

## 2015-02-11 ENCOUNTER — Other Ambulatory Visit: Payer: Self-pay | Admitting: Family Medicine

## 2015-02-17 ENCOUNTER — Other Ambulatory Visit: Payer: Self-pay | Admitting: Family Medicine

## 2015-04-02 ENCOUNTER — Emergency Department (HOSPITAL_COMMUNITY)
Admission: EM | Admit: 2015-04-02 | Discharge: 2015-04-04 | Disposition: A | Discharge status: Home / Self Care | Payer: 59 | Attending: Emergency Medicine | Admitting: Emergency Medicine

## 2015-04-02 ENCOUNTER — Encounter (HOSPITAL_COMMUNITY): Payer: Self-pay | Admitting: Emergency Medicine

## 2015-04-02 DIAGNOSIS — F909 Attention-deficit hyperactivity disorder, unspecified type: Secondary | ICD-10-CM | POA: Diagnosis not present

## 2015-04-02 DIAGNOSIS — I10 Essential (primary) hypertension: Secondary | ICD-10-CM | POA: Diagnosis not present

## 2015-04-02 DIAGNOSIS — E079 Disorder of thyroid, unspecified: Secondary | ICD-10-CM | POA: Diagnosis not present

## 2015-04-02 DIAGNOSIS — Z87891 Personal history of nicotine dependence: Secondary | ICD-10-CM | POA: Diagnosis not present

## 2015-04-02 DIAGNOSIS — F329 Major depressive disorder, single episode, unspecified: Secondary | ICD-10-CM | POA: Insufficient documentation

## 2015-04-02 DIAGNOSIS — Z79899 Other long term (current) drug therapy: Secondary | ICD-10-CM | POA: Diagnosis not present

## 2015-04-02 DIAGNOSIS — F332 Major depressive disorder, recurrent severe without psychotic features: Secondary | ICD-10-CM | POA: Diagnosis present

## 2015-04-02 DIAGNOSIS — Z880 Allergy status to penicillin: Secondary | ICD-10-CM | POA: Diagnosis not present

## 2015-04-02 DIAGNOSIS — F419 Anxiety disorder, unspecified: Secondary | ICD-10-CM | POA: Insufficient documentation

## 2015-04-02 DIAGNOSIS — R45851 Suicidal ideations: Secondary | ICD-10-CM

## 2015-04-02 DIAGNOSIS — F10220 Alcohol dependence with intoxication, uncomplicated: Secondary | ICD-10-CM | POA: Diagnosis present

## 2015-04-02 DIAGNOSIS — K219 Gastro-esophageal reflux disease without esophagitis: Secondary | ICD-10-CM | POA: Diagnosis not present

## 2015-04-02 DIAGNOSIS — F1994 Other psychoactive substance use, unspecified with psychoactive substance-induced mood disorder: Secondary | ICD-10-CM

## 2015-04-02 DIAGNOSIS — Z8669 Personal history of other diseases of the nervous system and sense organs: Secondary | ICD-10-CM | POA: Insufficient documentation

## 2015-04-02 DIAGNOSIS — F10129 Alcohol abuse with intoxication, unspecified: Secondary | ICD-10-CM | POA: Diagnosis not present

## 2015-04-02 DIAGNOSIS — Y908 Blood alcohol level of 240 mg/100 ml or more: Secondary | ICD-10-CM | POA: Diagnosis not present

## 2015-04-02 DIAGNOSIS — F1024 Alcohol dependence with alcohol-induced mood disorder: Secondary | ICD-10-CM | POA: Insufficient documentation

## 2015-04-02 LAB — COMPREHENSIVE METABOLIC PANEL
ALT: 35 U/L (ref 0–53)
AST: 29 U/L (ref 0–37)
Albumin: 4.7 g/dL (ref 3.5–5.2)
Alkaline Phosphatase: 61 U/L (ref 39–117)
Anion gap: 9 (ref 5–15)
BUN: 12 mg/dL (ref 6–23)
CHLORIDE: 106 mmol/L (ref 96–112)
CO2: 25 mmol/L (ref 19–32)
Calcium: 9.1 mg/dL (ref 8.4–10.5)
Creatinine, Ser: 1.32 mg/dL (ref 0.50–1.35)
GFR, EST AFRICAN AMERICAN: 73 mL/min — AB (ref >90)
GFR, EST NON AFRICAN AMERICAN: 63 mL/min — AB (ref >90)
GLUCOSE: 139 mg/dL — AB (ref 70–99)
Potassium: 3.8 mmol/L (ref 3.5–5.1)
Sodium: 140 mmol/L (ref 135–145)
Total Bilirubin: 0.6 mg/dL (ref 0.3–1.2)
Total Protein: 7.4 g/dL (ref 6.0–8.3)

## 2015-04-02 LAB — CBC
HEMATOCRIT: 46.3 % (ref 39.0–52.0)
HEMOGLOBIN: 16.1 g/dL (ref 13.0–17.0)
MCH: 31 pg (ref 26.0–34.0)
MCHC: 34.8 g/dL (ref 30.0–36.0)
MCV: 89 fL (ref 78.0–100.0)
PLATELETS: 307 10*3/uL (ref 150–400)
RBC: 5.2 MIL/uL (ref 4.22–5.81)
RDW: 12.6 % (ref 11.5–15.5)
WBC: 8.9 10*3/uL (ref 4.0–10.5)

## 2015-04-02 LAB — RAPID URINE DRUG SCREEN, HOSP PERFORMED
AMPHETAMINES: NOT DETECTED
BENZODIAZEPINES: NOT DETECTED
Barbiturates: NOT DETECTED
Cocaine: NOT DETECTED
OPIATES: NOT DETECTED
TETRAHYDROCANNABINOL: NOT DETECTED

## 2015-04-02 LAB — SALICYLATE LEVEL: Salicylate Lvl: <4 mg/dL (ref 2.8–20.0)

## 2015-04-02 LAB — ACETAMINOPHEN LEVEL

## 2015-04-02 LAB — ETHANOL: Alcohol, Ethyl (B): 294 mg/dL — ABNORMAL HIGH (ref 0–9)

## 2015-04-02 MED ORDER — CITALOPRAM HYDROBROMIDE 40 MG PO TABS
40.0000 mg | ORAL_TABLET | Freq: Every day | ORAL | Status: DC
  Filled 2015-04-02 (×2): qty 1

## 2015-04-02 MED ORDER — LEVOTHYROXINE SODIUM 88 MCG PO TABS
88.0000 ug | ORAL_TABLET | Freq: Every day | ORAL | Status: DC
  Administered 2015-04-03 – 2015-04-04 (×2): 88 ug via ORAL
  Filled 2015-04-02 (×3): qty 1

## 2015-04-02 MED ORDER — ACETAMINOPHEN 325 MG PO TABS
650.0000 mg | ORAL_TABLET | ORAL | Status: DC | PRN
  Administered 2015-04-03 (×2): 650 mg via ORAL
  Filled 2015-04-02 (×2): qty 2

## 2015-04-02 MED ORDER — LISINOPRIL 10 MG PO TABS
10.0000 mg | ORAL_TABLET | Freq: Every day | ORAL | Status: DC
  Administered 2015-04-03 – 2015-04-04 (×2): 10 mg via ORAL
  Filled 2015-04-02 (×2): qty 1

## 2015-04-02 MED ORDER — LISINOPRIL-HYDROCHLOROTHIAZIDE 10-12.5 MG PO TABS: 1.0000 | ORAL_TABLET | Freq: Every day | ORAL | Status: DC

## 2015-04-02 MED ORDER — LORAZEPAM 1 MG PO TABS
1.0000 mg | ORAL_TABLET | Freq: Three times a day (TID) | ORAL | Status: DC | PRN
  Filled 2015-04-02: qty 1

## 2015-04-02 MED ORDER — HYDROCHLOROTHIAZIDE 12.5 MG PO CAPS
12.5000 mg | ORAL_CAPSULE | Freq: Every day | ORAL | Status: DC
  Administered 2015-04-03 – 2015-04-04 (×2): 12.5 mg via ORAL
  Filled 2015-04-02 (×2): qty 1

## 2015-04-02 MED ORDER — PANTOPRAZOLE SODIUM 40 MG PO TBEC
40.0000 mg | DELAYED_RELEASE_TABLET | Freq: Every day | ORAL | Status: DC
  Administered 2015-04-03 – 2015-04-04 (×2): 40 mg via ORAL
  Filled 2015-04-02 (×2): qty 1

## 2015-04-02 MED ORDER — BUPROPION HCL ER (XL) 300 MG PO TB24
300.0000 mg | ORAL_TABLET | Freq: Every day | ORAL | Status: DC
  Administered 2015-04-03 – 2015-04-04 (×2): 300 mg via ORAL
  Filled 2015-04-02 (×2): qty 1

## 2015-04-02 NOTE — BH Assessment (Signed)
Obtained consent to release information to speak with pt's wife Shulem Mader who reported that if pt is admitted she would like to be informed so that her nursing students would not be placed on the adult unit.
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
## 2015-04-02 NOTE — ED Notes (Signed)
Staffing and [facility] called for sitter

## 2015-04-02 NOTE — BHH Counselor (Signed)
Russell Peters, [facility] at [facility], confirmed adult unit is currently at capacity. Contacted the following facilities for placement:  AT CAPACITY: [place], per [facility], per [facility]instein, per [employer] [facility], per [facility], per [facility], per [facility], per [facility], per [facility], per [facility], per [facility], per [facility], per [facility], per [facility], per [facility], per [facility], per Ranae Pila, per [facility], per [facility], per [facility], per [facility], per [facility], per [place], [facility], [facility] Triage Specialist [phone]

## 2015-04-02 NOTE — ED Notes (Signed)
[place] deputies left from pt bedside. Pt is calm and cooperative.

## 2015-04-02 NOTE — ED Notes (Signed)
pts family Bartosz Luginbill [phone] would like to speak to evaluator.
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
## 2015-04-02 NOTE — BH Assessment (Signed)
Assessment completed. Consulted Marcelene Butte, NP who agrees that pt meets inpatient criteria. [facility] at capacity. TTS will contact other facilities for placement. Dr. Tawnya Crook has been informed of the recommendation.

## 2015-04-02 NOTE — ED Provider Notes (Signed)
CSN: [id number]     Arrival date & time 04/02/15  1839 History   First MD Initiated Contact with Patient 04/02/15 1950     Chief Complaint  Patient presents with  . Suicidal   Russell Peters is a 49 y.o. male with a history of depression, GERD and hypothyroidism who presents the emergency department by [facility] Department after attempting to harm himself earlier today. I was told by [facility] Department the patient threatened to kill himself while waiting a gun in front of his wife earlier today. The wife was able to disarm him but the patient proceeded to go to his garage where he attempted to harm himself using a nail gun. Apparently the nail gun was loaded improperly and it misfired. Family took out IVC paperwork on the patient and he is under IVC at my evaluation. The patient tells me that he is going through a separation with his wife and this caused him to be very upset. She reports feeling depressed for the past several days. He does endorse having suicidal ideations earlier today but is unwilling to share about the events that happened earlier today. He does tell me he drank heavily earlier today. He tells me he does not drink daily and drinks approximately 2 times per week. He reports he has been taking his Wellbutrin. He denies homicidal ideations. The patient denies auditory or visual hallucinations. The patient denies paranoia. He denies any illicit drug use today. He denies taking any medications or substances to try and harm himself today. The patient denies physical complaints today. The patient denies fevers, headaches, changes to his vision, abdominal pain, nausea, vomiting, cough, wheezing, shortness of breath or chest pain.  (Consider location/radiation/quality/duration/timing/severity/associated sxs/prior Treatment) HPI  Past Medical History  Diagnosis Date  . Allergy   . ADD (attention deficit disorder)   . Hypertension   . GERD  (gastroesophageal reflux disease)   . Thyroid disease   . Sleep apnea    History reviewed. No pertinent past surgical history. No family history on file. History  Substance Use Topics  . Smoking status: Former Smoker    Quit date: 12/23/1998  . Smokeless tobacco: Never Used  . Alcohol Use: Yes    Review of Systems  Constitutional: Negative for fever and chills.  HENT: Negative for congestion, ear pain and sore throat.   Eyes: Negative for pain and visual disturbance.  Respiratory: Negative for cough, shortness of breath and wheezing.   Cardiovascular: Negative for chest pain.  Gastrointestinal: Negative for nausea, vomiting and abdominal pain.  Genitourinary: Negative for dysuria.  Musculoskeletal: Negative for back pain and neck pain.  Skin: Negative for rash.  Neurological: Negative for headaches.  Psychiatric/Behavioral: Positive for suicidal ideas and dysphoric mood. Negative for hallucinations, confusion and self-injury. The patient is nervous/anxious.       Allergies  Penicillins  Home Medications   Prior to Admission medications   Medication Sig Start Date End Date Taking? Authorizing Provider  amphetamine-dextroamphetamine (ADDERALL XR) 25 MG 24 hr capsule Take 25 mg by mouth daily.    Yes Historical Provider, MD  buPROPion (WELLBUTRIN XL) 300 MG 24 hr tablet Take 300 mg by mouth daily.   Yes Historical Provider, MD  citalopram (CELEXA) 40 MG tablet Take 40 mg by mouth daily.     Yes Historical Provider, MD  lisinopril-hydrochlorothiazide (PRINZIDE,ZESTORETIC) 10-12.5 MG per tablet TAKE 1 TABLET BY MOUTH ONCE DAILY. 02/13/15  Yes Denita Lung, MD  omeprazole ([place]) 20  MG capsule Take 20 mg by mouth daily.     Yes Historical Provider, MD  SYNTHROID 88 MCG tablet Take 1 tablet (88 mcg total) by mouth daily. 01/10/14  Yes Denita Lung, MD  HYDROcodone-acetaminophen (NORCO/VICODIN) 5-325 MG per tablet Take 1-2 tablets by mouth every 4 (four) hours as  needed. Patient not taking: Reported on 04/02/2015 08/14/14   Shari Upstill, PA-C  SYNTHROID 88 MCG tablet TAKE 1 TABLET BY MOUTH ONCE DAILY. Patient not taking: Reported on 04/02/2015 02/17/15   Denita Lung, MD  triamcinolone cream (KENALOG) 0.5 % Apply 1 application topically 3 (three) times daily. Patient not taking: Reported on 04/02/2015 03/07/14   Denita Lung, MD  vardenafil (LEVITRA) 20 MG tablet Take 1 tablet (20 mg total) by mouth daily as needed for erectile dysfunction. 03/24/12 04/23/12  Denita Lung, MD   BP 105/64 mmHg  Pulse 86  Temp(Src) 97.8 F (36.6 C) (Oral)  Resp 16  SpO2 100% Physical Exam  Constitutional: He is oriented to person, place, and time. He appears well-developed and well-nourished. No distress.  The patient smells of alcohol. Nontoxic-appearing.  HENT:  Head: Normocephalic and atraumatic.  Right Ear: External ear normal.  Left Ear: External ear normal.  Mouth/Throat: Oropharynx is clear and moist.  Eyes: Conjunctivae are normal. Pupils are equal, round, and reactive to light. Right eye exhibits no discharge. Left eye exhibits no discharge.  Neck: Neck supple. No JVD present.  Cardiovascular: Normal rate, regular rhythm, normal heart sounds and intact distal pulses.  Exam reveals no gallop and no friction rub.   No murmur heard. Bilateral radial pulses are intact.  Pulmonary/Chest: Effort normal and breath sounds normal. No respiratory distress. He has no wheezes. He has no rales.  Abdominal: Soft. He exhibits no distension. There is no tenderness.  Musculoskeletal: He exhibits no edema.  Lymphadenopathy:    He has no cervical adenopathy.  Neurological: He is alert and oriented to person, place, and time. Coordination normal.  He is alert and oriented 3.  Skin: Skin is warm and dry. No rash noted. He is not diaphoretic. No erythema. No pallor.  Psychiatric: His behavior is normal. His mood appears not anxious. His affect is not angry, not blunt, not  labile and not inappropriate. His speech is not rapid and/or pressured. He is not agitated, not aggressive, not hyperactive, not slowed, not actively hallucinating and not combative. Thought content is not paranoid and not delusional. He exhibits a depressed mood. He expresses suicidal ideation. He expresses no homicidal ideation.  The patient appears depressed. He makes poor eye contact during interview. He endorses suicidal ideations earlier today denies SI. He denies homicidal ideations. He is alert and oriented 3. He is calm and cooperative with me. He did not want to discuss what happened earlier today.  Nursing note and vitals reviewed.   ED Course  Procedures (including critical care time) Labs Review Labs Reviewed  ACETAMINOPHEN LEVEL - Abnormal; Notable for the following:    Acetaminophen (Tylenol), Serum <10.0 (*)    All other components within normal limits  COMPREHENSIVE METABOLIC PANEL - Abnormal; Notable for the following:    Glucose, Bld 139 (*)    GFR calc non Af Amer 63 (*)    GFR calc Af Amer 73 (*)    All other components within normal limits  ETHANOL - Abnormal; Notable for the following:    Alcohol, Ethyl (B) 294 (*)    All other components within normal limits  CBC  SALICYLATE LEVEL  URINE RAPID DRUG SCREEN (HOSP PERFORMED)    Imaging Review No results found.   EKG Interpretation None     Filed Vitals:   04/02/15 1834 04/02/15 1952 04/02/15 2012  BP: 161/113 127/92 105/64  Pulse: 11 96 86  Temp: 97.8 F (36.6 C)    TempSrc: Oral  Oral  Resp: 18 15 16   SpO2: 96% 99% 100%    MDM   Meds given in ED:  Medications  LORazepam (ATIVAN) tablet 1 mg (not administered)  acetaminophen (TYLENOL) tablet 650 mg (not administered)  buPROPion (WELLBUTRIN XL) 24 hr tablet 300 mg (not administered)  citalopram (CELEXA) tablet 40 mg (not administered)  pantoprazole (PROTONIX) EC tablet 40 mg (not administered)  levothyroxine (SYNTHROID, LEVOTHROID) tablet 88  mcg (not administered)  lisinopril (PRINIVIL,ZESTRIL) tablet 10 mg (not administered)    And  hydrochlorothiazide (MICROZIDE) capsule 12.5 mg (not administered)    New Prescriptions   No medications on file    Final diagnoses:  Suicidal ideations   This is a 49 year old male who presents to the emergency department by [facility] after attempting to harm himself earlier today. He has a history of depression. According to [facility] deputy he attempted to shoot himself with a gun and then attempted to harm himself with a nail gun. He is under IVC. The patient reports he has been drinking a large amount of alcohol today. The patient does appear intoxicated on evaluation. The patient endorses feeling depressed recently. He endorses suicidal ideations earlier today but denies current suicidal ideations to me. He refuses to discuss the events of earlier today. The patient is calm and cooperative with my evaluation. The patient's urine drug screen is negative. His Tylenol level is negative. His CBC and CMP are unremarkable. He has an alcohol level of 294. He reports he does not drink daily to me. He reports this was the first time he drank in several days. The patient is medically cleared for TTS evaluation. TTS informed that he meets inpatient criteria. Will place cycle holding orders and reorder his home meds. I reevaluated the patient and he was sleeping in his bed. I informed him he will be staying for further evaluation and he is in agreement with this plan.        Waynetta Pean, PA-C 04/02/15 5035  Ernestina Patches, MD 04/03/15 [phone]

## 2015-04-02 NOTE — ED Notes (Signed)
Pt escorted by [facility] officer and 2 sheriff deputies to [employer] from triage. Pt is alert and oriented. VSS, MD is at bedside.

## 2015-04-03 DIAGNOSIS — R45851 Suicidal ideations: Secondary | ICD-10-CM | POA: Diagnosis not present

## 2015-04-03 DIAGNOSIS — F332 Major depressive disorder, recurrent severe without psychotic features: Secondary | ICD-10-CM | POA: Diagnosis not present

## 2015-04-03 NOTE — ED Notes (Signed)
Pt provided lunch meal.

## 2015-04-03 NOTE — ED Notes (Addendum)
Pt is lying on his right side with eyes closed. Respirations are even, regular and unlabored. Sitter remains at bedside.

## 2015-04-03 NOTE — Consult Note (Signed)
[facility] Consult   Reason for Consult: Major depression, recurrent, suicidal idaeation Referring Physician:  EDP Patient Identification: Russell Peters MRN:  [id number] Principal Diagnosis: Major depressive disorder, recurrent severe without psychotic features Diagnosis:   Patient Active Problem List   Diagnosis Date Noted  . Major depressive disorder, recurrent severe without psychotic features [F33.2] 04/03/2015    Priority: High  . Hypothyroid [E03.9] 05/16/2011  . Hypertension [I10] 05/16/2011  . Obesity [E66.9] 05/16/2011    Total Time spent with patient: 1 hour  Subjective:   Russell Peters is a 49 y.o. male patient admitted with .Major depression, recurrent, suicidal idaeation  HPI:  HPI Elements:   Caucasian male, 49 years old was evaluated for suicidal ideation.  Patient was brought in by [facility] after he threatened to kill himself with a gun.  Patient this morning reported that he was intoxicated.  He stated that he going through  marital problem.   He reports feeling sad, hopeless and helpless. He also reported a hx of Alcolism and stated that he has had detox treatment in the past.  Patient reported that he does not remember what happened yesterday but stated that he is depressed and was intoxicated yesterday.  See TTS note of yesterday.  Patient's daughter took a loaded gun  Away from her father yesterday and the father had other guns in the house.  Patient denies SI/HI/AVH but stated that he cannot promise providers what he will do if he leaves.   He denies HI/AVH.  He has been accepted for admission and will be transferred to any facility with available inpatient Psychiatric bed.   Location:  Major depressive disorder, recurrent severe, suicidal ideation/attempt . Quality:  Severe, feels hopeless, helpless and had a loaded gun to head. Severity:  severe. Timing: severe. Duration: .Chronic illness Context:  Seeking treatment for depression and  Alcholism  Past Medical History:  Past Medical History  Diagnosis Date  . Allergy   . ADD (attention deficit disorder)   . Hypertension   . GERD (gastroesophageal reflux disease)   . Thyroid disease   . Sleep apnea    History reviewed. No pertinent past surgical history. Family History: No family history on file. Social History:  History  Alcohol Use  . Yes     History  Drug Use Not on file    History   Social History  . Marital Status: Married    Spouse Name: N/A  . Number of Children: N/A  . Years of Education: N/A   Social History Main Topics  . Smoking status: Former Smoker    Quit date: 12/23/1998  . Smokeless tobacco: Never Used  . Alcohol Use: Yes  . Drug Use: Not on file  . Sexual Activity: Not on file   Other Topics Concern  . None   Social History Narrative   Additional Social History:    History of alcohol / drug use?: Yes Name of Substance 1: ETOH 1 - Age of First Use: Teens  1 - Amount (size/oz): unknown  1 - Frequency: unknown  1 - Duration: ongoing 1 - Last Use / Amount: 04-02-15 BAL-294                   Allergies:   Allergies  Allergen Reactions  . Penicillins     Labs:  Results for orders placed or performed during the hospital encounter of 04/02/15 (from the past 48 hour(s))  Acetaminophen level     Status: Abnormal  Collection Time: 04/02/15  7:32 PM  Result Value Ref Range   Acetaminophen (Tylenol), Serum <10.0 (L) 10 - 30 ug/mL    Comment:        THERAPEUTIC CONCENTRATIONS VARY SIGNIFICANTLY. A RANGE OF 10-30 ug/mL MAY BE AN EFFECTIVE CONCENTRATION FOR MANY PATIENTS. HOWEVER, SOME ARE BEST TREATED AT CONCENTRATIONS OUTSIDE THIS RANGE. ACETAMINOPHEN CONCENTRATIONS >150 ug/mL AT 4 HOURS AFTER INGESTION AND >50 ug/mL AT 12 HOURS AFTER INGESTION ARE OFTEN ASSOCIATED WITH TOXIC REACTIONS.   CBC     Status: None   Collection Time: 04/02/15  7:32 PM  Result Value Ref Range   WBC 8.9 4.0 - 10.5 K/uL   RBC 5.20  4.22 - 5.81 MIL/uL   Hemoglobin 16.1 13.0 - 17.0 g/dL   HCT 46.3 39.0 - 52.0 %   MCV 89.0 78.0 - 100.0 fL   MCH 31.0 26.0 - 34.0 pg   MCHC 34.8 30.0 - 36.0 g/dL   RDW 12.6 11.5 - 15.5 %   Platelets 307 150 - 400 K/uL  Comprehensive metabolic panel     Status: Abnormal   Collection Time: 04/02/15  7:32 PM  Result Value Ref Range   Sodium 140 135 - 145 mmol/L   Potassium 3.8 3.5 - 5.1 mmol/L   Chloride 106 96 - 112 mmol/L   CO2 25 19 - 32 mmol/L   Glucose, Bld 139 (H) 70 - 99 mg/dL   BUN 12 6 - 23 mg/dL   Creatinine, Ser 1.32 0.50 - 1.35 mg/dL   Calcium 9.1 8.4 - 10.5 mg/dL   Total Protein 7.4 6.0 - 8.3 g/dL   Albumin 4.7 3.5 - 5.2 g/dL   AST 29 0 - 37 U/L   ALT 35 0 - 53 U/L   Alkaline Phosphatase 61 39 - 117 U/L   Total Bilirubin 0.6 0.3 - 1.2 mg/dL   GFR calc non Af Amer 63 (L) >90 mL/min   GFR calc Af Amer 73 (L) >90 mL/min    Comment: (NOTE) The eGFR has been calculated using the CKD EPI equation. This calculation has not been validated in all clinical situations. eGFR's persistently <90 mL/min signify possible Chronic Kidney Disease.    Anion gap 9 5 - 15  Ethanol (ETOH)     Status: Abnormal   Collection Time: 04/02/15  7:32 PM  Result Value Ref Range   Alcohol, Ethyl (B) 294 (H) 0 - 9 mg/dL    Comment:        LOWEST DETECTABLE LIMIT FOR SERUM ALCOHOL IS 11 mg/dL FOR MEDICAL PURPOSES ONLY   Salicylate level     Status: None   Collection Time: 04/02/15  7:32 PM  Result Value Ref Range   Salicylate Lvl <9.1 2.8 - 20.0 mg/dL  Urine Drug Screen     Status: None   Collection Time: 04/02/15  8:45 PM  Result Value Ref Range   Opiates NONE DETECTED NONE DETECTED   Cocaine NONE DETECTED NONE DETECTED   Benzodiazepines NONE DETECTED NONE DETECTED   Amphetamines NONE DETECTED NONE DETECTED   Tetrahydrocannabinol NONE DETECTED NONE DETECTED   Barbiturates NONE DETECTED NONE DETECTED    Comment:        DRUG SCREEN FOR MEDICAL PURPOSES ONLY.  IF CONFIRMATION IS  NEEDED FOR ANY PURPOSE, NOTIFY LAB WITHIN 5 DAYS.        LOWEST DETECTABLE LIMITS FOR URINE DRUG SCREEN Drug Class       Cutoff (ng/mL) Amphetamine      1000 Barbiturate  200 Benzodiazepine   161 Tricyclics       096 Opiates          300 Cocaine          300 THC              50     Vitals: Blood pressure 121/67, pulse 80, temperature 98.7 F (37.1 C), temperature source Oral, resp. rate 18, SpO2 98 %.  Risk to Self: Suicidal Ideation: Yes-Currently Present Suicidal Intent: Yes-Currently Present Is patient at risk for suicide?: Yes Suicidal Plan?: Yes-Currently Present Specify Current Suicidal Plan: PT denies at this time but informed a family member that he would shot himself. Access to Means: Yes Specify Access to Suicidal Means: PT reported that he owns several guns.  What has been your use of drugs/alcohol within the last 12 months?: Alcohol use reported. BAL -294 How many times?: 0 Other Self Harm Risks: No other self harm risk identified at this time  Triggers for Past Attempts: None known Intentional Self Injurious Behavior: None Risk to Others: Homicidal Ideation: No Thoughts of Harm to Others: No Current Homicidal Intent: No Current Homicidal Plan: No Access to Homicidal Means: No Identified Victim: NA History of harm to others?: No Assessment of Violence: On admission Violent Behavior Description: No violent behaviors observed. Pt is calm and cooperative at this time.  Does patient have access to weapons?: No ("I have a couple of guns". ) Criminal Charges Pending?: No Does patient have a court date: No Prior Inpatient Therapy: Prior Inpatient Therapy: No Prior Outpatient Therapy: Prior Outpatient Therapy: Yes Prior Therapy Dates: 2015-present  Prior Therapy Facilty/Provider(s): [facility] Reason for Treatment: Marital Problems   Current Facility-Administered Medications  Medication Dose Route Frequency Provider Last Rate Last Dose  . acetaminophen  (TYLENOL) tablet 650 mg  650 mg Oral Q4H PRN Waynetta Pean, PA-C   650 mg at 04/03/15 0454  . buPROPion (WELLBUTRIN XL) 24 hr tablet 300 mg  300 mg Oral Daily Waynetta Pean, PA-C   300 mg at 04/03/15 0905  . citalopram (CELEXA) tablet 40 mg  40 mg Oral Daily Waynetta Pean, PA-C   40 mg at 04/03/15 0909  . lisinopril (PRINIVIL,ZESTRIL) tablet 10 mg  10 mg Oral Daily Ernestina Patches, MD   10 mg at 04/03/15 0981   And  . hydrochlorothiazide (MICROZIDE) capsule 12.5 mg  12.5 mg Oral Daily Ernestina Patches, MD   12.5 mg at 04/03/15 0905  . levothyroxine (SYNTHROID, LEVOTHROID) tablet 88 mcg  88 mcg Oral QAC breakfast Waynetta Pean, PA-C   88 mcg at 04/03/15 0855  . LORazepam (ATIVAN) tablet 1 mg  1 mg Oral Q8H PRN Waynetta Pean, PA-C      . pantoprazole (PROTONIX) EC tablet 40 mg  40 mg Oral Daily Waynetta Pean, PA-C   40 mg at 04/03/15 1914   Current Outpatient Prescriptions  Medication Sig Dispense Refill  . amphetamine-dextroamphetamine (ADDERALL XR) 25 MG 24 hr capsule Take 25 mg by mouth daily.     Marland Kitchen buPROPion (WELLBUTRIN XL) 300 MG 24 hr tablet Take 300 mg by mouth daily.    . citalopram (CELEXA) 40 MG tablet Take 40 mg by mouth daily.      Marland Kitchen lisinopril-hydrochlorothiazide (PRINZIDE,ZESTORETIC) 10-12.5 MG per tablet TAKE 1 TABLET BY MOUTH ONCE DAILY. 30 tablet 2  . omeprazole (PRILOSEC) 20 MG capsule Take 20 mg by mouth daily.      Marland Kitchen SYNTHROID 88 MCG tablet Take 1 tablet (88 mcg total) by  mouth daily. 30 tablet 2  . HYDROcodone-acetaminophen (NORCO/VICODIN) 5-325 MG per tablet Take 1-2 tablets by mouth every 4 (four) hours as needed. (Patient not taking: Reported on 04/02/2015) 12 tablet 0  . SYNTHROID 88 MCG tablet TAKE 1 TABLET BY MOUTH ONCE DAILY. (Patient not taking: Reported on 04/02/2015) 30 tablet 2  . triamcinolone cream (KENALOG) 0.5 % Apply 1 application topically 3 (three) times daily. (Patient not taking: Reported on 04/02/2015) 30 g 1  . vardenafil (LEVITRA) 20 MG tablet Take 1  tablet (20 mg total) by mouth daily as needed for erectile dysfunction. 2 tablet 0    Musculoskeletal: Strength & Muscle Tone: within normal limits Gait & Station: normal Patient leans: N/A  Psychiatric Specialty Exam:     Blood pressure 121/67, pulse 80, temperature 98.7 F (37.1 C), temperature source Oral, resp. rate 18, SpO2 98 %.There is no weight on file to calculate BMI.  General Appearance: Casual  Eye Contact::  Good  Speech:  Clear and Coherent and Normal Rate  Volume:  Normal  Mood:  Angry and Depressed  Affect:  Congruent, Depressed and Flat  Thought Process:  Coherent, Goal Directed and Intact  Orientation:  Full (Time, Place, and Person)  Thought Content:  WDL  Suicidal Thoughts:  Yes.  with intent/plan  Homicidal Thoughts:  No  Memory:  Immediate;   Good Recent;   Good Remote;   Good  Judgement:  Impaired  Insight:  Fair  Psychomotor Activity:  Normal  Concentration:  Good  Recall:  NA  Fund of Knowledge:Good  Language: Good  Akathisia:  NA  Handed:  Right  AIMS (if indicated):     Assets:  Desire for Improvement  ADL's:  Intact  Cognition: WNL  Sleep:      Medical Decision Making: Review of Psycho-Social Stressors (1), Established Problem, Worsening (2) and Review of Medication Regimen & Side Effects (2)  Treatment Plan Summary: Daily contact with patient to assess and evaluate symptoms and progress in treatment, Medication management and Plan accepted for admission and waiting for bed  Plan:  Recommend psychiatric Inpatient admission when medically cleared. Disposition: Admit and wait for placement  Delfin Gant   PMHNP-BC 04/03/2015 2:18 PM Patient seen face-to-face for psychiatric evaluation, chart reviewed and case discussed with the physician extender and developed treatment plan. Reviewed the information documented and agree with the treatment plan. Corena Pilgrim, MD

## 2015-04-03 NOTE — ED Notes (Signed)
Patient is resting comfortably. Breathing WNL.

## 2015-04-04 DIAGNOSIS — F1994 Other psychoactive substance use, unspecified with psychoactive substance-induced mood disorder: Secondary | ICD-10-CM | POA: Diagnosis not present

## 2015-04-04 DIAGNOSIS — F10220 Alcohol dependence with intoxication, uncomplicated: Secondary | ICD-10-CM | POA: Diagnosis present

## 2015-04-04 DIAGNOSIS — Y908 Blood alcohol level of 240 mg/100 ml or more: Secondary | ICD-10-CM

## 2015-04-04 DIAGNOSIS — F10129 Alcohol abuse with intoxication, unspecified: Secondary | ICD-10-CM | POA: Diagnosis not present

## 2015-04-04 NOTE — [facility] Suicide Risk Assessment (Signed)
Suicide Risk Assessment  Discharge Assessment   [facility] Discharge Suicide Risk Assessment   Demographic Factors:  Male and Caucasian  Total Time spent with patient: 30 minutes   Musculoskeletal: Strength & Muscle Tone: within normal limits Gait & Station: normal Patient leans: N/A  Psychiatric Specialty Exam:     Blood pressure 137/89, pulse 90, temperature 98.1 F (36.7 C), temperature source Oral, resp. rate 20, SpO2 96 %.There is no weight on file to calculate BMI.  General Appearance: Casual  Eye Contact::  Good  Speech:  Normal Rate  Volume:  Normal  Mood:  Depressed, mild  Affect:  Appropriate  Thought Process:  Coherent  Orientation:  Full (Time, Place, and Person)  Thought Content:  WDL  Suicidal Thoughts:  No  Homicidal Thoughts:  No  Memory:  Immediate;   Good Recent;   Good Remote;   Good  Judgement:  Fair  Insight:  Good  Psychomotor Activity:  Normal  Concentration:  Good  Recall:  Good  Fund of Knowledge:Good  Language: Good  Akathisia:  No  Handed:  Right  AIMS (if indicated):     Assets:  Catering manager Housing Leisure Time Physical Health Resilience Social Support Vocational/Educational  ADL's:  Intact  Cognition: WNL  Sleep:         Has this patient used any form of tobacco in the last 30 days? (Cigarettes, Smokeless Tobacco, Cigars, and/or Pipes) No  Mental Status Per Nursing Assessment::   On Admission:   Alcohol intoxication  Current Mental Status by Physician: NA  Loss Factors: NA  Historical Factors: NA  Risk Reduction Factors:   Sense of responsibility to family, Employed, Living with another person, especially a relative, Positive social support and Positive therapeutic relationship  Continued Clinical Symptoms:  Mild situational depression  Cognitive Features That Contribute To Risk:  None    Suicide Risk:  Minimal: No identifiable suicidal ideation.  Patients presenting with no risk factors but with  morbid ruminations; may be classified as minimal risk based on the severity of the depressive symptoms  Principal Problem: Substance induced mood disorder Discharge Diagnoses:  Patient Active Problem List   Diagnosis Date Noted  . Alcohol dependence with intoxication, uncomplicated [L97.673] 41/93/7902    Priority: High  . Substance induced mood disorder [F19.94] 04/04/2015    Priority: High  . Hypothyroid [E03.9] 05/16/2011  . Hypertension [I10] 05/16/2011  . Obesity [E66.9] 05/16/2011      Plan Of Care/Follow-up recommendations:  Activity:  as tolerated Diet:  heart healthy diet  Is patient on multiple antipsychotic therapies at discharge:  No   Has Patient had three or more failed trials of antipsychotic monotherapy by history:  No  Recommended Plan for Multiple Antipsychotic Therapies: NA    LORD, JAMISON, PMH-NP 04/04/2015, 1:42 PM

## 2015-04-04 NOTE — ED Notes (Signed)
Psych MD and psych PA at bedside .

## 2015-04-04 NOTE — ED Notes (Addendum)
Pt is awake and lying on stretcher. Pt resting quietly. Appears in no distress. Respirations are even, regular, and unlabored. Sitter remains at bedside.

## 2015-04-04 NOTE — ED Notes (Signed)
Pt sitting up in chair in the room. Appears in no distress.

## 2015-04-04 NOTE — ED Notes (Signed)
Family at bedside. Has been at bedside waiting for the pt to be discharged.

## 2015-04-04 NOTE — ED Notes (Signed)
Patient is up and brushing his teeth. Pt has no complaints or needs.

## 2015-04-04 NOTE — Progress Notes (Addendum)
Per psychiatrist and Np, patient is psychiatrically stable for discharge home pending confirmed secured status of gun. Patient shared that patient wife has gun secured and provided csw number for wife. CSW left message for patient wife at number provided [phone]. CSW awaiting return call from pt wife.   Pt youngest brother present at bedside. Pt plans to go home and collect belongings and then stay with brothers. Patient youngest brother states that he can confirm patient will not have access to gun at their homes.   CSW updated RN, discharge pending confirmation of secured gun from pt wife.   Belia Heman, LCSW  Clinical Social Work  [employer] [phone]    CSW spoke with Arling Cerone, per wife, guns are no longer in the home as well as alcohol. Teddy Rebstock stated that patient would not have access to gun.   Belia Heman, [place] Work  [employer] [phone]

## 2015-04-04 NOTE — Consult Note (Signed)
[place] [facility] Consult   Reason for Consult:  Alcohol intoxication  Referring Physician:  EDP Patient Identification: Russell Peters MRN:  [id number] Principal Diagnosis: Substance induced mood disorder Diagnosis:   Patient Active Problem List   Diagnosis Date Noted  . Alcohol dependence with intoxication, uncomplicated [O24.235] 36/14/4315    Priority: High  . Substance induced mood disorder [F19.94] 04/04/2015    Priority: High  . Hypothyroid [E03.9] 05/16/2011  . Hypertension [I10] 05/16/2011  . Obesity [E66.9] 05/16/2011    Total Time spent with patient: 30 minutes  Subjective:   Russell Peters is a 49 y.o. male patient has stabilized.  HPI:  The patient presented to the ED intoxicated on alcohol.  He is going through a divorce and having a difficult time.  Last 06-25-2023, his father died and in 07-25-23, his wife of 23 years, was cheating on him with another man.  They have been living in the same house with her upstairs and him downstairs.  He had not been drinking for the past 8 months because of the issues it caused between him and his wife until Sunday.  He was going to take his 15 yo step-grandson to the baseball game but started drinking, and "drank too much".  He got upset with his wife leaving him for another man and threatened to shoot himself with a gun.  Today, he does not remember it but has been told what he did.  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  He does not want inpatient stabilization.  Denies suicidal/homicidal ideations, hallucinations, and drug issues.  His wife and family feel he is safe to return home.  However, he is going to stay with family after discharge, versus his wife.  Family were notified and they assured [place] that all the guns are out of the home. HPI Elements:  Generalized, few weeks, acute, intermittent, stressors  Past Medical History:  Past Medical History  Diagnosis  Date  . Allergy   . ADD (attention deficit disorder)   . Hypertension   . GERD (gastroesophageal reflux disease)   . Thyroid disease   . Sleep apnea    History reviewed. No pertinent past surgical history. Family History: No family history on file. Social History:  History  Alcohol Use  . Yes     History  Drug Use Not on file    History   Social History  . Marital Status: Married    Spouse Name: N/A  . Number of Children: N/A  . Years of Education: N/A   Social History Main Topics  . Smoking status: Former Smoker    Quit date: 12/23/1998  . Smokeless tobacco: Never Used  . Alcohol Use: Yes  . Drug Use: Not on file  . Sexual Activity: Not on file   Other Topics Concern  . None   Social History Narrative   Additional Social History:    History of alcohol / drug use?: Yes Name of Substance 1: ETOH 1 - Age of First Use: Teens  1 - Amount (size/oz): unknown  1 - Frequency: unknown  1 - Duration: ongoing 1 - Last Use / Amount: 04-02-15 BAL-294                   Allergies:   Allergies  Allergen Reactions  . Penicillins     Labs:  Results for orders placed or performed during the hospital encounter of 04/02/15 (  from the past 48 hour(s))  Acetaminophen level     Status: Abnormal   Collection Time: 04/02/15  7:32 PM  Result Value Ref Range   Acetaminophen (Tylenol), Serum <10.0 (L) 10 - 30 ug/mL    Comment:        THERAPEUTIC CONCENTRATIONS VARY SIGNIFICANTLY. A RANGE OF 10-30 ug/mL MAY BE AN EFFECTIVE CONCENTRATION FOR MANY PATIENTS. HOWEVER, SOME ARE BEST TREATED AT CONCENTRATIONS OUTSIDE THIS RANGE. ACETAMINOPHEN CONCENTRATIONS >150 ug/mL AT 4 HOURS AFTER INGESTION AND >50 ug/mL AT 12 HOURS AFTER INGESTION ARE OFTEN ASSOCIATED WITH TOXIC REACTIONS.   CBC     Status: None   Collection Time: 04/02/15  7:32 PM  Result Value Ref Range   WBC 8.9 4.0 - 10.5 K/uL   RBC 5.20 4.22 - 5.81 MIL/uL   Hemoglobin 16.1 13.0 - 17.0 g/dL   HCT 46.3 39.0  - 52.0 %   MCV 89.0 78.0 - 100.0 fL   MCH 31.0 26.0 - 34.0 pg   MCHC 34.8 30.0 - 36.0 g/dL   RDW 12.6 11.5 - 15.5 %   Platelets 307 150 - 400 K/uL  Comprehensive metabolic panel     Status: Abnormal   Collection Time: 04/02/15  7:32 PM  Result Value Ref Range   Sodium 140 135 - 145 mmol/L   Potassium 3.8 3.5 - 5.1 mmol/L   Chloride 106 96 - 112 mmol/L   CO2 25 19 - 32 mmol/L   Glucose, Bld 139 (H) 70 - 99 mg/dL   BUN 12 6 - 23 mg/dL   Creatinine, Ser 1.32 0.50 - 1.35 mg/dL   Calcium 9.1 8.4 - 10.5 mg/dL   Total Protein 7.4 6.0 - 8.3 g/dL   Albumin 4.7 3.5 - 5.2 g/dL   AST 29 0 - 37 U/L   ALT 35 0 - 53 U/L   Alkaline Phosphatase 61 39 - 117 U/L   Total Bilirubin 0.6 0.3 - 1.2 mg/dL   GFR calc non Af Amer 63 (L) >90 mL/min   GFR calc Af Amer 73 (L) >90 mL/min    Comment: (NOTE) The eGFR has been calculated using the CKD EPI equation. This calculation has not been validated in all clinical situations. eGFR's persistently <90 mL/min signify possible Chronic Kidney Disease.    Anion gap 9 5 - 15  Ethanol (ETOH)     Status: Abnormal   Collection Time: 04/02/15  7:32 PM  Result Value Ref Range   Alcohol, Ethyl (B) 294 (H) 0 - 9 mg/dL    Comment:        LOWEST DETECTABLE LIMIT FOR SERUM ALCOHOL IS 11 mg/dL FOR MEDICAL PURPOSES ONLY   Salicylate level     Status: None   Collection Time: 04/02/15  7:32 PM  Result Value Ref Range   Salicylate Lvl <4.3 2.8 - 20.0 mg/dL  Urine Drug Screen     Status: None   Collection Time: 04/02/15  8:45 PM  Result Value Ref Range   Opiates NONE DETECTED NONE DETECTED   Cocaine NONE DETECTED NONE DETECTED   Benzodiazepines NONE DETECTED NONE DETECTED   Amphetamines NONE DETECTED NONE DETECTED   Tetrahydrocannabinol NONE DETECTED NONE DETECTED   Barbiturates NONE DETECTED NONE DETECTED    Comment:        DRUG SCREEN FOR MEDICAL PURPOSES ONLY.  IF CONFIRMATION IS NEEDED FOR ANY PURPOSE, NOTIFY LAB WITHIN 5 DAYS.        LOWEST DETECTABLE  LIMITS FOR URINE DRUG SCREEN Drug Class  Cutoff (ng/mL) Amphetamine      1000 Barbiturate      200 Benzodiazepine   832 Tricyclics       549 Opiates          300 Cocaine          300 THC              50     Vitals: Blood pressure 137/89, pulse 90, temperature 98.1 F (36.7 C), temperature source Oral, resp. rate 20, SpO2 96 %.  Risk to Self: Suicidal Ideation: Yes-Currently Present Suicidal Intent: Yes-Currently Present Is patient at risk for suicide?: Yes Suicidal Plan?: Yes-Currently Present Specify Current Suicidal Plan: PT denies at this time but informed a family member that he would shot himself. Access to Means: Yes Specify Access to Suicidal Means: PT reported that he owns several guns.  What has been your use of drugs/alcohol within the last 12 months?: Alcohol use reported. BAL -294 How many times?: 0 Other Self Harm Risks: No other self harm risk identified at this time  Triggers for Past Attempts: None known Intentional Self Injurious Behavior: None Risk to Others: Homicidal Ideation: No Thoughts of Harm to Others: No Current Homicidal Intent: No Current Homicidal Plan: No Access to Homicidal Means: No Identified Victim: NA History of harm to others?: No Assessment of Violence: On admission Violent Behavior Description: No violent behaviors observed. Pt is calm and cooperative at this time.  Does patient have access to weapons?: No ("I have a couple of guns". ) Criminal Charges Pending?: No Does patient have a court date: No Prior Inpatient Therapy: Prior Inpatient Therapy: No Prior Outpatient Therapy: Prior Outpatient Therapy: Yes Prior Therapy Dates: 2015-present  Prior Therapy Facilty/Provider(s): [facility] Reason for Treatment: Marital Problems   Current Facility-Administered Medications  Medication Dose Route Frequency Provider Last Rate Last Dose  . acetaminophen (TYLENOL) tablet 650 mg  650 mg Oral Q4H PRN Waynetta Pean, PA-C   650 mg at  04/03/15 1817  . buPROPion (WELLBUTRIN XL) 24 hr tablet 300 mg  300 mg Oral Daily Waynetta Pean, PA-C   300 mg at 04/04/15 0912  . citalopram (CELEXA) tablet 40 mg  40 mg Oral Daily Waynetta Pean, PA-C   40 mg at 04/03/15 0909  . lisinopril (PRINIVIL,ZESTRIL) tablet 10 mg  10 mg Oral Daily Ernestina Patches, MD   10 mg at 04/04/15 0912   And  . hydrochlorothiazide (MICROZIDE) capsule 12.5 mg  12.5 mg Oral Daily Ernestina Patches, MD   12.5 mg at 04/04/15 0920  . levothyroxine (SYNTHROID, LEVOTHROID) tablet 88 mcg  88 mcg Oral QAC breakfast Waynetta Pean, PA-C   88 mcg at 04/04/15 0826  . LORazepam (ATIVAN) tablet 1 mg  1 mg Oral Q8H PRN Waynetta Pean, PA-C      . pantoprazole (PROTONIX) EC tablet 40 mg  40 mg Oral Daily Waynetta Pean, PA-C   40 mg at 04/04/15 8264   Current Outpatient Prescriptions  Medication Sig Dispense Refill  . amphetamine-dextroamphetamine (ADDERALL XR) 25 MG 24 hr capsule Take 25 mg by mouth daily.     Marland Kitchen buPROPion (WELLBUTRIN XL) 300 MG 24 hr tablet Take 300 mg by mouth daily.    . citalopram (CELEXA) 40 MG tablet Take 40 mg by mouth daily.      Marland Kitchen lisinopril-hydrochlorothiazide (PRINZIDE,ZESTORETIC) 10-12.5 MG per tablet TAKE 1 TABLET BY MOUTH ONCE DAILY. 30 tablet 2  . omeprazole (PRILOSEC) 20 MG capsule Take 20 mg by mouth daily.      Marland Kitchen  SYNTHROID 88 MCG tablet Take 1 tablet (88 mcg total) by mouth daily. 30 tablet 2  . HYDROcodone-acetaminophen (NORCO/VICODIN) 5-325 MG per tablet Take 1-2 tablets by mouth every 4 (four) hours as needed. (Patient not taking: Reported on 04/02/2015) 12 tablet 0  . SYNTHROID 88 MCG tablet TAKE 1 TABLET BY MOUTH ONCE DAILY. (Patient not taking: Reported on 04/02/2015) 30 tablet 2  . triamcinolone cream (KENALOG) 0.5 % Apply 1 application topically 3 (three) times daily. (Patient not taking: Reported on 04/02/2015) 30 g 1  . vardenafil (LEVITRA) 20 MG tablet Take 1 tablet (20 mg total) by mouth daily as needed for erectile dysfunction. 2 tablet  0    Musculoskeletal: Strength & Muscle Tone: within normal limits Gait & Station: normal Patient leans: N/A  Psychiatric Specialty Exam:     Blood pressure 137/89, pulse 90, temperature 98.1 F (36.7 C), temperature source Oral, resp. rate 20, SpO2 96 %.There is no weight on file to calculate BMI.  General Appearance: Casual  Eye Contact::  Good  Speech:  Normal Rate  Volume:  Normal  Mood:  Depressed, mild  Affect:  Appropriate  Thought Process:  Coherent  Orientation:  Full (Time, Place, and Person)  Thought Content:  WDL  Suicidal Thoughts:  No  Homicidal Thoughts:  No  Memory:  Immediate;   Good Recent;   Good Remote;   Good  Judgement:  Fair  Insight:  Good  Psychomotor Activity:  Normal  Concentration:  Good  Recall:  Good  Fund of Knowledge:Good  Language: Good  Akathisia:  No  Handed:  Right  AIMS (if indicated):     Assets:  Catering manager Housing Leisure Time Physical Health Resilience Social Support Vocational/Educational  ADL's:  Intact  Cognition: WNL  Sleep:      Medical Decision Making: Review of Psycho-Social Stressors (1), Review or order clinical lab tests (1) and Review of Medication Regimen & Side Effects (2)  Treatment Plan Summary: Daily contact with patient to assess and evaluate symptoms and progress in treatment, Medication management and Plan Discharge with follow-up resources  Plan:  No evidence of imminent risk to self or others at present.   Disposition: Discharge with follow-up resources  Waylan Boga, PMH-NP 04/04/2015 1:37 PM Patient seen face-to-face for psychiatric evaluation, chart reviewed and case discussed with the physician extender and developed treatment plan. Reviewed the information documented and agree with the treatment plan. Corena Pilgrim, MD

## 2015-04-04 NOTE — ED Notes (Addendum)
Pt is awake and denies pain. Offered patient an ATIVAN to sleep but he does not want to take it. Pt is calm and cooperative. Sitter remains at bedside.

## 2015-04-04 NOTE — ED Notes (Signed)
Pt lying on his right side with eyes closed. Respirations are even, regular, and unlabored. Sitter remains at bedside.

## 2015-04-11 ENCOUNTER — Telehealth: Payer: Self-pay | Admitting: Family Medicine

## 2015-04-11 ENCOUNTER — Ambulatory Visit (HOSPITAL_COMMUNITY)
Admission: AD | Admit: 2015-04-11 | Discharge: 2015-04-11 | DRG: 885 | Disposition: A | Discharge status: Home / Self Care | Payer: 59 | Attending: Psychiatry | Admitting: Psychiatry

## 2015-04-11 ENCOUNTER — Encounter (HOSPITAL_COMMUNITY): Payer: Self-pay

## 2015-04-11 ENCOUNTER — Encounter (HOSPITAL_COMMUNITY): Payer: Self-pay | Admitting: *Deleted

## 2015-04-11 ENCOUNTER — Emergency Department (HOSPITAL_COMMUNITY)
Admission: EM | Admit: 2015-04-11 | Discharge: 2015-04-11 | Disposition: A | Discharge status: Home / Self Care | Payer: 59 | Attending: Emergency Medicine | Admitting: Emergency Medicine

## 2015-04-11 DIAGNOSIS — Z87891 Personal history of nicotine dependence: Secondary | ICD-10-CM | POA: Diagnosis not present

## 2015-04-11 DIAGNOSIS — Z046 Encounter for general psychiatric examination, requested by authority: Secondary | ICD-10-CM | POA: Diagnosis not present

## 2015-04-11 DIAGNOSIS — Z79899 Other long term (current) drug therapy: Secondary | ICD-10-CM | POA: Diagnosis not present

## 2015-04-11 DIAGNOSIS — E079 Disorder of thyroid, unspecified: Secondary | ICD-10-CM | POA: Insufficient documentation

## 2015-04-11 DIAGNOSIS — F329 Major depressive disorder, single episode, unspecified: Secondary | ICD-10-CM | POA: Diagnosis not present

## 2015-04-11 DIAGNOSIS — K219 Gastro-esophageal reflux disease without esophagitis: Secondary | ICD-10-CM | POA: Diagnosis present

## 2015-04-11 DIAGNOSIS — F332 Major depressive disorder, recurrent severe without psychotic features: Secondary | ICD-10-CM | POA: Diagnosis not present

## 2015-04-11 DIAGNOSIS — Z880 Allergy status to penicillin: Secondary | ICD-10-CM | POA: Diagnosis not present

## 2015-04-11 DIAGNOSIS — Z008 Encounter for other general examination: Secondary | ICD-10-CM

## 2015-04-11 DIAGNOSIS — Z8669 Personal history of other diseases of the nervous system and sense organs: Secondary | ICD-10-CM | POA: Diagnosis not present

## 2015-04-11 DIAGNOSIS — F909 Attention-deficit hyperactivity disorder, unspecified type: Secondary | ICD-10-CM | POA: Diagnosis not present

## 2015-04-11 DIAGNOSIS — Z7952 Long term (current) use of systemic steroids: Secondary | ICD-10-CM | POA: Insufficient documentation

## 2015-04-11 DIAGNOSIS — I10 Essential (primary) hypertension: Secondary | ICD-10-CM | POA: Insufficient documentation

## 2015-04-11 DIAGNOSIS — R45851 Suicidal ideations: Secondary | ICD-10-CM | POA: Diagnosis present

## 2015-04-11 LAB — SALICYLATE LEVEL: Salicylate Lvl: <4 mg/dL (ref 2.8–20.0)

## 2015-04-11 LAB — COMPREHENSIVE METABOLIC PANEL
ALBUMIN: 4.6 g/dL (ref 3.5–5.2)
ALT: 35 U/L (ref 0–53)
AST: 23 U/L (ref 0–37)
Alkaline Phosphatase: 58 U/L (ref 39–117)
Anion gap: 5 (ref 5–15)
BILIRUBIN TOTAL: 0.9 mg/dL (ref 0.3–1.2)
BUN: 13 mg/dL (ref 6–23)
CHLORIDE: 106 mmol/L (ref 96–112)
CO2: 27 mmol/L (ref 19–32)
CREATININE: 1.14 mg/dL (ref 0.50–1.35)
Calcium: 9 mg/dL (ref 8.4–10.5)
GFR calc Af Amer: 87 mL/min — ABNORMAL LOW (ref >90)
GFR calc non Af Amer: 75 mL/min — ABNORMAL LOW (ref >90)
Glucose, Bld: 94 mg/dL (ref 70–99)
Potassium: 3.7 mmol/L (ref 3.5–5.1)
Sodium: 138 mmol/L (ref 135–145)
Total Protein: 7 g/dL (ref 6.0–8.3)

## 2015-04-11 LAB — CBC
HEMATOCRIT: 44.8 % (ref 39.0–52.0)
HEMOGLOBIN: 15.4 g/dL (ref 13.0–17.0)
MCH: 31.3 pg (ref 26.0–34.0)
MCHC: 34.4 g/dL (ref 30.0–36.0)
MCV: 91.1 fL (ref 78.0–100.0)
Platelets: 279 10*3/uL (ref 150–400)
RBC: 4.92 MIL/uL (ref 4.22–5.81)
RDW: 12.8 % (ref 11.5–15.5)
WBC: 7.5 10*3/uL (ref 4.0–10.5)

## 2015-04-11 LAB — ACETAMINOPHEN LEVEL

## 2015-04-11 LAB — ETHANOL

## 2015-04-11 LAB — RAPID URINE DRUG SCREEN, HOSP PERFORMED
AMPHETAMINES: NOT DETECTED
Barbiturates: NOT DETECTED
Benzodiazepines: NOT DETECTED
COCAINE: NOT DETECTED
Opiates: NOT DETECTED
Tetrahydrocannabinol: NOT DETECTED

## 2015-04-11 NOTE — Telephone Encounter (Signed)
He was seen by Vanetta Mulders in follow-up concerning recent psychiatric hospitalization. She feels as if he needs more intensive therapy. I recommended to him and Russell Peters that he called [name] to have her coordinate counseling.

## 2015-04-11 NOTE — ED Provider Notes (Signed)
CSN: [id number]     Arrival date & time 04/11/15  1704 History   First MD Initiated Contact with Patient 04/11/15 1743     Chief Complaint  Patient presents with  . Depression     (Consider location/radiation/quality/duration/timing/severity/associated sxs/prior Treatment) HPI Russell Peters is a 49 year old male past medical history of depression who presents the ER complaining of depressive symptoms as well as having some transient suicidal ideation. Patient states he has had a lot of family stressors recently, easily went to his counselor as an outpatient who recommended he go to behavioral health for further evaluation and treatment. Patient was seen at behavioral health counselor, and recommended to stay as inpatient and placed at behavioral health. Patient was sent to [facility] for medical clearance. To me patient does endorse having some recent suicidal ideation, however states he does not specifically want to act on anything, and denies current SI/HI.  Patient denies having any specific medical complaints to me.  Past Medical History  Diagnosis Date  . Allergy   . ADD (attention deficit disorder)   . Hypertension   . GERD (gastroesophageal reflux disease)   . Thyroid disease   . Sleep apnea    History reviewed. No pertinent past surgical history. No family history on file. History  Substance Use Topics  . Smoking status: Former Smoker    Quit date: 12/23/1998  . Smokeless tobacco: Never Used  . Alcohol Use: Yes     Comment: occ    Review of Systems  Constitutional: Negative for fever.  HENT: Negative for trouble swallowing.   Eyes: Negative for visual disturbance.  Respiratory: Negative for shortness of breath.   Cardiovascular: Negative for chest pain.  Gastrointestinal: Negative for nausea, vomiting and abdominal pain.  Genitourinary: Negative for dysuria.  Musculoskeletal: Negative for neck pain.  Skin: Negative for rash.  Neurological: Negative for dizziness,  weakness and numbness.  Psychiatric/Behavioral: Positive for suicidal ideas and dysphoric mood.      Allergies  Penicillins  Home Medications   Prior to Admission medications   Medication Sig Start Date End Date Taking? Authorizing Provider  amphetamine-dextroamphetamine (ADDERALL XR) 25 MG 24 hr capsule Take 25 mg by mouth daily.    Yes Historical Provider, MD  buPROPion (WELLBUTRIN XL) 300 MG 24 hr tablet Take 450 mg by mouth daily.    Yes Historical Provider, MD  Docosahexaenoic Acid 300 MG CAPS Take 1 capsule by mouth daily.   Yes Historical Provider, MD  lisinopril-hydrochlorothiazide (PRINZIDE,ZESTORETIC) 10-12.5 MG per tablet TAKE 1 TABLET BY MOUTH ONCE DAILY. Patient taking differently: Take 1 tablet by mouth once daily. 02/13/15  Yes Denita Lung, MD  omeprazole (PRILOSEC) 20 MG capsule Take 20 mg by mouth daily.     Yes Historical Provider, MD  SYNTHROID 88 MCG tablet Take 1 tablet (88 mcg total) by mouth daily. 01/10/14  Yes Denita Lung, MD  citalopram (CELEXA) 40 MG tablet Take 40 mg by mouth daily.      Historical Provider, MD  HYDROcodone-acetaminophen (NORCO/VICODIN) 5-325 MG per tablet Take 1-2 tablets by mouth every 4 (four) hours as needed. Patient not taking: Reported on 04/02/2015 08/14/14   Shari Upstill, PA-C  SYNTHROID 88 MCG tablet TAKE 1 TABLET BY MOUTH ONCE DAILY. Patient not taking: Reported on 04/02/2015 02/17/15   Denita Lung, MD  triamcinolone cream (KENALOG) 0.5 % Apply 1 application topically 3 (three) times daily. Patient not taking: Reported on 04/02/2015 03/07/14   Denita Lung, MD  vardenafil (  LEVITRA) 20 MG tablet Take 1 tablet (20 mg total) by mouth daily as needed for erectile dysfunction. 03/24/12 04/23/12  Denita Lung, MD   BP 124/64 mmHg  Pulse 85  Temp(Src) 98.1 F (36.7 C) (Oral)  Resp 20  SpO2 100% Physical Exam  Constitutional: He is oriented to person, place, and time. He appears well-developed and well-nourished. No distress.   HENT:  Head: Normocephalic and atraumatic.  Mouth/Throat: Oropharynx is clear and moist. No oropharyngeal exudate.  Eyes: Right eye exhibits no discharge. Left eye exhibits no discharge. No scleral icterus.  Neck: Normal range of motion.  Cardiovascular: Normal rate, regular rhythm and normal heart sounds.   No murmur heard. Pulmonary/Chest: Effort normal and breath sounds normal. No respiratory distress.  Abdominal: Soft. There is no tenderness.  Musculoskeletal: Normal range of motion. He exhibits no edema or tenderness.  Neurological: He is alert and oriented to person, place, and time. No cranial nerve deficit. Coordination normal.  Skin: Skin is warm and dry. No rash noted. He is not diaphoretic.  Psychiatric:  Patient euthymic with affect appropriate to mood. Speech is normal and communicative. Behavior is appropriate, patient does not appear to be responding to any internal stimuli. Thought content is oriented and goal directed. Judgment and insight are appropriate.  Nursing note and vitals reviewed.   ED Course  Procedures (including critical care time) Labs Review Labs Reviewed  ACETAMINOPHEN LEVEL - Abnormal; Notable for the following:    Acetaminophen (Tylenol), Serum <10.0 (*)    All other components within normal limits  COMPREHENSIVE METABOLIC PANEL - Abnormal; Notable for the following:    GFR calc non Af Amer 75 (*)    GFR calc Af Amer 87 (*)    All other components within normal limits  CBC  ETHANOL  SALICYLATE LEVEL  URINE RAPID DRUG SCREEN (HOSP PERFORMED)    Imaging Review No results found.   EKG Interpretation None      MDM   Final diagnoses:  Medical clearance for psychiatric admission    Patient here for medical clearance regarding some suicidal ideation he has had recently and already has placement at behavioral health. Patient denying having any medical complaints. Do not see any evidence of patient's suicidal ideation being related to  organic etiology. Doubt encephalopathy of any kind. Patient well-appearing, hemodynamically stable and in no acute distress. Patient will be medically cleared to return to behavioral health for further psychiatric evaluation.  BP 124/64 mmHg  Pulse 85  Temp(Src) 98.1 F (36.7 C) (Oral)  Resp 20  SpO2 100%  Signed,  Dahlia Bailiff, PA-C 1:24 AM   Dahlia Bailiff, PA-C 04/12/15 0124  Nat Christen, MD 04/12/15 [phone]

## 2015-04-11 NOTE — Telephone Encounter (Signed)
Wife,Dona,called stating that pt's therapist, Vanetta Mulders, is suggesting that pt go to behavioral health. Dona and pt is asking for direction on how to proceed with this process. What does Dr Redmond School think? Tommi Emery can be reached at [phone] and pt can be reached at [phone]

## 2015-04-11 NOTE — ED Notes (Signed)
Patient informed that he has been medically cleared to go back across the street to [facility] Patient denies c/o pain or other needs at time of DC  Patient in NAD upon leaving ED

## 2015-04-11 NOTE — ED Notes (Addendum)
Pt presents with c/o depression. Pt was seen at his therapist today and she sent him to [facility] for medical clearance. Denies any SI/HI. Tech from [facility] reported that pt does have a bed at [facility] once he is medically cleared.

## 2015-04-11 NOTE — BH Assessment (Signed)
Assessment Note  Russell Peters is an 49 y.o. male that was seen this day as a walk-in at [facility].  Pt came alone at the suggestion of his therapist, Katina Degree, after he reported SI in a session with her today.  Pt currently endorses SI with a plan to shoot himself in his shed over a tarp, so that it is easier to clean up by report, or to take an overdose of his medications.  Pt has no access to guns after the incident on 04/03/15 where pt was placed under IVC and sent to [facility] after getting intoxicated, had a gun, and was threatening to kill himself and his wife.  Pt stated his depression has worsened and he has been on a "downward spiral" since he found out his wife of 23 years has been cheating on him with one, possibly 2 men.  Pt stated he found this out after the death of his father from cancer one month prior, in July 2015.  Pt stated he is overwhelmed and cannot handle the depression.  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  He was prescribed Celexa, Adderall, and Wellbutrin by his psychiatrist, but stopped the Celexa this past February on his own because he thought that if his sex drive returned, that his wife would want him back.  Pt went to psychiatrist yesterday, and was prescribed a medication for Bipolar Disorder by report.  Pt is also in therapy with Katina Degree.  Pt denies HI or AVH.  No delusions noted.  Pt endorses anxiety with hx of panic attacks.  Pt stated he has been diagnosed with ADHD and depression.  Pt cooperative, pleasant, oriented x 4, had normal speech, logical/coherent thought processes, good eye contact, depressed mood, and appropriate affect.  Inpatient psychiatric hospitalization is recommended for the pt at this time.  Pt is voluntary and motivated for treatment.  Consulted with Elmarie Shiley, NP at [facility] at 1615 who accepted pt to [facility] pending medical clearance.  Pt accepted to 403-2 to  Dr. Parke Poisson per Debarah Crape, [facility].  [place] to call [facility] when pt can be transported after medical clearance.  Pt transported to [facility] via [place], as pt is voluntary.  Called [place] to transprot and notified [facility] of pt's upcoming arrival.  Updated TTS.  Axis I: 296.33 Major Depressive Disorder, Recurrent Episode, Severe Axis II: Deferred Axis III:  Past Medical History  Diagnosis Date  . Allergy   . ADD (attention deficit disorder)   . Hypertension   . GERD (gastroesophageal reflux disease)   . Thyroid disease   . Sleep apnea    Axis IV: occupational problems, other psychosocial or environmental problems, problems related to social environment, problems with primary support group and bereavement Axis V: 21-30 behavior considerably influenced by delusions or hallucinations OR serious impairment in judgment, communication OR inability to function in almost all areas  Past Medical History:  Past Medical History  Diagnosis Date  . Allergy   . ADD (attention deficit disorder)   . Hypertension   . GERD (gastroesophageal reflux disease)   . Thyroid disease   . Sleep apnea     No past surgical history on file.  Family History: No family history on file.  Social History:  reports that he quit smoking about 16 years ago. He has never used smokeless tobacco. He reports that he drinks alcohol. His drug history  is not on file.  Additional Social History:  Alcohol / Drug Use Pain Medications: see med list Prescriptions: see med list Over the Counter: see med list History of alcohol / drug use?: Yes Negative Consequences of Use: Personal relationships Withdrawal Symptoms:  (na) Substance #1 Name of Substance 1: ETOH 1 - Age of First Use: Teens  1 - Amount (size/oz): one fifth every 2 weeks 1 - Frequency: currently denies use 1 - Duration: ongoing, denies current use 1 - Last Use / Amount: 04-02-15 [id number]  CIWA:   COWS:    Allergies:  Allergies  Allergen Reactions  . Penicillins      Home Medications:  (Not in a hospital admission)  OB/GYN Status:  No LMP for male patient.  General Assessment Data Location of Assessment: [facility] Assessment Services Is this a Tele or Face-to-Face Assessment?: Face-to-Face Is this an Initial Assessment or a Re-assessment for this encounter?: Initial Assessment Living Arrangements: Spouse/significant other Can pt return to current living arrangement?: Yes Admission Status: Voluntary Is patient capable of signing voluntary admission?: Yes Transfer from: Other (Comment) (Therapist's office) Referral Source: Other Animator)  Medical Screening Exam ([place]) Medical Exam completed: No Reason for MSE not completed: Other: (pt sent to [facility] for med clearance)  [place] Living Arrangements: Spouse/significant other Name of Psychiatrist: Dr, Toy Care  Name of Therapist: Katina Degree   Education Status Is patient currently in school?: No Highest grade of school patient has completed: [employer]  Risk to self with the past 6 months Suicidal Ideation: Yes-Currently Present Suicidal Intent: Yes-Currently Present Is patient at risk for suicide?: Yes Suicidal Plan?: Yes-Currently Present Specify Current Suicidal Plan: to shoot self or overdose on pills Access to Means: Yes Specify Access to Suicidal Means: can access meds but stepdaughter has guns What has been your use of drugs/alcohol within the last 12 months?: pt reports used to use alcohol, has not drank since 04/02/15 Previous Attempts/Gestures: No How many times?: 0 (did threaten to kill self and wife on 04/02/15, sent to [facility]) Other Self Harm Risks: na - pt denies Triggers for Past Attempts: None known Intentional Self Injurious Behavior: None Family Suicide History: No Recent stressful life event(s): Loss (Comment), Turmoil (Comment), Other (Comment) (death of father in Jul 15, 2023, found out wife cheating in August) Persecutory voices/beliefs?:  No Depression: Yes Depression Symptoms: Despondent, Insomnia, Tearfulness, Isolating, Guilt, Loss of interest in usual pleasures, Feeling worthless/self pity Substance abuse history and/or treatment for substance abuse?: Yes Suicide prevention information given to non-admitted patients: Not applicable  Risk to Others within the past 6 months Homicidal Ideation: No Thoughts of Harm to Others: No Current Homicidal Intent: No Current Homicidal Plan: No Access to Homicidal Means: No Identified Victim: na - pt denies History of harm to others?: No Assessment of Violence: None Noted Violent Behavior Description: pt calm cooperative Does patient have access to weapons?: No Criminal Charges Pending?: No Does patient have a court date: No  Psychosis Hallucinations: None noted Delusions: None noted  Mental Status Report Appearance/Hygiene: Other (Comment) (casual, in street clothing, WNL) Eye Contact: Good Motor Activity: Freedom of movement, Unremarkable Speech: Logical/coherent Level of Consciousness: Alert Mood: Depressed Affect: Depressed, Appropriate to circumstance Anxiety Level: Panic Attacks Panic attack frequency: 4-5 that he can recall Most recent panic attack: 04/02/15 Thought Processes: Coherent, Relevant Judgement: Impaired Orientation: Person, Place, Time, Situation Obsessive Compulsive Thoughts/Behaviors: None  Cognitive Functioning Concentration: Decreased Memory: Recent Intact, Remote Intact IQ: Average Insight: Fair Impulse Control: Fair  Appetite: Poor Weight Loss: 10 Weight Gain: 0 Sleep: Decreased Total Hours of Sleep:  (varies) Vegetative Symptoms: None  ADLScreening [facility] Assessment Services) Patient's cognitive ability adequate to safely complete daily activities?: Yes Patient able to express need for assistance with ADLs?: Yes Independently performs ADLs?: Yes (appropriate for developmental age)  Prior Inpatient Therapy Prior Inpatient Therapy:  No Prior Therapy Dates: na Prior Therapy Facilty/Provider(s): na Reason for Treatment: na  Prior Outpatient Therapy Prior Outpatient Therapy: Yes Prior Therapy Dates: 2015-present  Prior Therapy Facilty/Provider(s): Eyvonne Mechanic Reason for Treatment: Marital Problems/Therapy  ADL Screening (condition at time of admission) Patient's cognitive ability adequate to safely complete daily activities?: Yes Is the patient deaf or have difficulty hearing?: No Does the patient have difficulty seeing, even when wearing glasses/contacts?: No Does the patient have difficulty concentrating, remembering, or making decisions?: No Patient able to express need for assistance with ADLs?: Yes Does the patient have difficulty dressing or bathing?: No Independently performs ADLs?: Yes (appropriate for developmental age) Does the patient have difficulty walking or climbing stairs?: No  Home Assistive Devices/Equipment Home Assistive Devices/Equipment: None    Abuse/Neglect Assessment (Assessment to be complete while patient is alone) Physical Abuse: Denies Verbal Abuse: Denies Sexual Abuse: Denies Exploitation of patient/patient's resources: Denies Self-Neglect: Denies Values / Beliefs Cultural Requests During Hospitalization: None Spiritual Requests During Hospitalization: None Consults Spiritual Care Consult Needed: No Social Work Consult Needed: No Regulatory affairs officer (For Healthcare) Does patient have an advance directive?: Yes Would patient like information on creating an advanced directive?: No - patient declined information Type of Advance Directive: Living will, Advance instruction for mental health treatment, Mental Health Advance Directive Does patient want to make changes to advanced directive?: No - Patient declined Copy of advanced directive(s) in chart?: No - copy requested    Additional Information 1:1 In Past 12 Months?: No CIRT Risk: No Elopement Risk: No Does patient have  medical clearance?: No     Disposition:  Disposition Initial Assessment Completed for this Encounter: Yes Disposition of Patient: Inpatient treatment program, Other dispositions Type of inpatient treatment program: Adult Other disposition(s): Other (Comment) (Pt accepted [facility], sent to [facility] for med clearance)  On Site Evaluation by:   Reviewed with Physician:    Shaune Pascal, MS, [facility] Therapeutic Triage Specialist [facility]   04/11/2015 5:04 PM

## 2015-04-11 NOTE — Discharge Instructions (Signed)
Medical Screening Exam °A medical screening exam has been done. This exam helps find the cause of your problem and determines whether you need emergency treatment. Your exam has shown that you do not need emergency treatment at this point. It is safe for you to go to your caregiver's office or clinic for treatment. You should make an appointment today to see your caregiver as soon as he or she is available. °Depending on your illness, your symptoms and condition can change over time. If your condition gets worse or you develop new or troubling symptoms before you see your caregiver, you should return to the emergency department for further evaluation.  °Document Released: 01/16/2005 Document Revised: 03/02/2012 Document Reviewed: 08/28/2011 °ExitCare® Patient Information ©2015 [employer]. This information is not intended to replace advice given to you by your health care provider. Make sure you discuss any questions you have with your health care provider. ° °

## 2015-04-13 ENCOUNTER — Inpatient Hospital Stay (HOSPITAL_COMMUNITY)
Admission: EM | Admit: 2015-04-13 | Discharge: 2015-04-18 | DRG: 885 | Disposition: A | Discharge status: Home / Self Care | Payer: 59 | Source: Intra-hospital | Attending: Psychiatry | Admitting: Psychiatry

## 2015-04-13 ENCOUNTER — Institutional Professional Consult (permissible substitution): Payer: Self-pay | Admitting: Family Medicine

## 2015-04-13 ENCOUNTER — Ambulatory Visit (HOSPITAL_COMMUNITY): Payer: Self-pay

## 2015-04-13 ENCOUNTER — Ambulatory Visit (HOSPITAL_COMMUNITY)
Admission: RE | Admit: 2015-04-13 | Discharge: 2015-04-13 | Disposition: A | Discharge status: Home / Self Care | Payer: 59 | Source: Home / Self Care | Attending: Psychiatry | Admitting: Psychiatry

## 2015-04-13 ENCOUNTER — Emergency Department (HOSPITAL_COMMUNITY)
Admission: EM | Admit: 2015-04-13 | Discharge: 2015-04-13 | Disposition: A | Discharge status: Other Acute Inpatient Hospital | Payer: 59 | Attending: Emergency Medicine | Admitting: Emergency Medicine

## 2015-04-13 ENCOUNTER — Encounter (HOSPITAL_COMMUNITY): Payer: Self-pay | Admitting: Emergency Medicine

## 2015-04-13 ENCOUNTER — Encounter (HOSPITAL_COMMUNITY): Payer: Self-pay | Admitting: *Deleted

## 2015-04-13 DIAGNOSIS — I10 Essential (primary) hypertension: Secondary | ICD-10-CM | POA: Diagnosis present

## 2015-04-13 DIAGNOSIS — F329 Major depressive disorder, single episode, unspecified: Secondary | ICD-10-CM | POA: Insufficient documentation

## 2015-04-13 DIAGNOSIS — K219 Gastro-esophageal reflux disease without esophagitis: Secondary | ICD-10-CM | POA: Diagnosis not present

## 2015-04-13 DIAGNOSIS — E039 Hypothyroidism, unspecified: Secondary | ICD-10-CM | POA: Diagnosis present

## 2015-04-13 DIAGNOSIS — F1010 Alcohol abuse, uncomplicated: Secondary | ICD-10-CM | POA: Diagnosis not present

## 2015-04-13 DIAGNOSIS — Z87891 Personal history of nicotine dependence: Secondary | ICD-10-CM

## 2015-04-13 DIAGNOSIS — Z880 Allergy status to penicillin: Secondary | ICD-10-CM | POA: Diagnosis not present

## 2015-04-13 DIAGNOSIS — G4700 Insomnia, unspecified: Secondary | ICD-10-CM | POA: Diagnosis present

## 2015-04-13 DIAGNOSIS — Z7952 Long term (current) use of systemic steroids: Secondary | ICD-10-CM | POA: Diagnosis not present

## 2015-04-13 DIAGNOSIS — F419 Anxiety disorder, unspecified: Secondary | ICD-10-CM | POA: Diagnosis present

## 2015-04-13 DIAGNOSIS — Z8669 Personal history of other diseases of the nervous system and sense organs: Secondary | ICD-10-CM | POA: Diagnosis not present

## 2015-04-13 DIAGNOSIS — F325 Major depressive disorder, single episode, in full remission: Secondary | ICD-10-CM | POA: Insufficient documentation

## 2015-04-13 DIAGNOSIS — R45851 Suicidal ideations: Secondary | ICD-10-CM | POA: Diagnosis present

## 2015-04-13 DIAGNOSIS — F909 Attention-deficit hyperactivity disorder, unspecified type: Secondary | ICD-10-CM | POA: Diagnosis present

## 2015-04-13 DIAGNOSIS — E079 Disorder of thyroid, unspecified: Secondary | ICD-10-CM | POA: Insufficient documentation

## 2015-04-13 DIAGNOSIS — F332 Major depressive disorder, recurrent severe without psychotic features: Secondary | ICD-10-CM

## 2015-04-13 DIAGNOSIS — Z79899 Other long term (current) drug therapy: Secondary | ICD-10-CM | POA: Diagnosis not present

## 2015-04-13 DIAGNOSIS — F32A Depression, unspecified: Secondary | ICD-10-CM

## 2015-04-13 HISTORY — DX: Major depressive disorder, single episode, unspecified: F32.9

## 2015-04-13 HISTORY — DX: Depression, unspecified: F32.A

## 2015-04-13 LAB — COMPREHENSIVE METABOLIC PANEL
ALK PHOS: 61 U/L (ref 39–117)
ALT: 36 U/L (ref 0–53)
ANION GAP: 10 (ref 5–15)
AST: 29 U/L (ref 0–37)
Albumin: 4.8 g/dL (ref 3.5–5.2)
BILIRUBIN TOTAL: 1.3 mg/dL — AB (ref 0.3–1.2)
BUN: 18 mg/dL (ref 6–23)
CHLORIDE: 105 mmol/L (ref 96–112)
CO2: 26 mmol/L (ref 19–32)
Calcium: 9.4 mg/dL (ref 8.4–10.5)
Creatinine, Ser: 1.3 mg/dL (ref 0.50–1.35)
GFR calc non Af Amer: 64 mL/min — ABNORMAL LOW (ref >90)
GFR, EST AFRICAN AMERICAN: 74 mL/min — AB (ref >90)
GLUCOSE: 109 mg/dL — AB (ref 70–99)
POTASSIUM: 4 mmol/L (ref 3.5–5.1)
Sodium: 141 mmol/L (ref 135–145)
Total Protein: 7.5 g/dL (ref 6.0–8.3)

## 2015-04-13 LAB — CBC
HCT: 46.8 % (ref 39.0–52.0)
HEMOGLOBIN: 16 g/dL (ref 13.0–17.0)
MCH: 31.1 pg (ref 26.0–34.0)
MCHC: 34.2 g/dL (ref 30.0–36.0)
MCV: 90.9 fL (ref 78.0–100.0)
Platelets: 278 10*3/uL (ref 150–400)
RBC: 5.15 MIL/uL (ref 4.22–5.81)
RDW: 12.9 % (ref 11.5–15.5)
WBC: 8.3 10*3/uL (ref 4.0–10.5)

## 2015-04-13 LAB — SALICYLATE LEVEL: Salicylate Lvl: <4 mg/dL (ref 2.8–20.0)

## 2015-04-13 LAB — ETHANOL: Alcohol, Ethyl (B): <5 mg/dL (ref 0–9)

## 2015-04-13 LAB — ACETAMINOPHEN LEVEL: Acetaminophen (Tylenol), Serum: <10 ug/mL — ABNORMAL LOW (ref 10–30)

## 2015-04-13 MED ORDER — BUPROPION HCL ER (XL) 150 MG PO TB24
450.0000 mg | ORAL_TABLET | Freq: Every day | ORAL | Status: DC
  Filled 2015-04-13: qty 1

## 2015-04-13 MED ORDER — BUPROPION HCL ER (XL) 150 MG PO TB24
150.0000 mg | ORAL_TABLET | Freq: Every day | ORAL | Status: DC
  Filled 2015-04-13: qty 1

## 2015-04-13 MED ORDER — PANTOPRAZOLE SODIUM 40 MG PO TBEC
40.0000 mg | DELAYED_RELEASE_TABLET | Freq: Every day | ORAL | Status: DC
  Administered 2015-04-14 – 2015-04-18 (×5): 40 mg via ORAL
  Filled 2015-04-13 (×7): qty 1

## 2015-04-13 MED ORDER — MAGNESIUM HYDROXIDE 400 MG/5ML PO SUSP
30.0000 mL | Freq: Every day | ORAL | Status: DC | PRN
  Administered 2015-04-15: 30 mL via ORAL
  Filled 2015-04-13: qty 30

## 2015-04-13 MED ORDER — LEVOTHYROXINE SODIUM 88 MCG PO TABS
88.0000 ug | ORAL_TABLET | Freq: Every day | ORAL | Status: DC
  Administered 2015-04-14 – 2015-04-18 (×5): 88 ug via ORAL
  Filled 2015-04-13 (×7): qty 1

## 2015-04-13 MED ORDER — TRAZODONE HCL 50 MG PO TABS
50.0000 mg | ORAL_TABLET | Freq: Every evening | ORAL | Status: DC | PRN
  Administered 2015-04-14: 50 mg via ORAL
  Filled 2015-04-13 (×2): qty 1

## 2015-04-13 MED ORDER — AMPHETAMINE-DEXTROAMPHET ER 5 MG PO CP24
25.0000 mg | ORAL_CAPSULE | Freq: Every day | ORAL | Status: DC
  Administered 2015-04-14: 25 mg via ORAL
  Filled 2015-04-13 (×3): qty 5

## 2015-04-13 MED ORDER — LISINOPRIL-HYDROCHLOROTHIAZIDE 10-12.5 MG PO TABS: 1.0000 | ORAL_TABLET | Freq: Every day | ORAL | Status: DC

## 2015-04-13 MED ORDER — HYDROCHLOROTHIAZIDE 12.5 MG PO CAPS
12.5000 mg | ORAL_CAPSULE | Freq: Every day | ORAL | Status: DC
  Administered 2015-04-14 – 2015-04-18 (×5): 12.5 mg via ORAL
  Filled 2015-04-13 (×7): qty 1

## 2015-04-13 MED ORDER — ACETAMINOPHEN 325 MG PO TABS: 650.0000 mg | ORAL_TABLET | Freq: Four times a day (QID) | ORAL | Status: DC | PRN

## 2015-04-13 MED ORDER — VARDENAFIL HCL 20 MG PO TABS: 20.0000 mg | ORAL_TABLET | Freq: Every day | ORAL | Status: DC | PRN

## 2015-04-13 MED ORDER — ALUM & MAG HYDROXIDE-SIMETH 200-200-20 MG/5ML PO SUSP: 30.0000 mL | ORAL | Status: DC | PRN

## 2015-04-13 MED ORDER — BUPROPION HCL ER (XL) 300 MG PO TB24
450.0000 mg | ORAL_TABLET | Freq: Every day | ORAL | Status: DC
  Administered 2015-04-14 – 2015-04-18 (×5): 450 mg via ORAL
  Filled 2015-04-13 (×8): qty 1

## 2015-04-13 MED ORDER — LISINOPRIL 10 MG PO TABS
10.0000 mg | ORAL_TABLET | Freq: Every day | ORAL | Status: DC
  Administered 2015-04-14 – 2015-04-18 (×5): 10 mg via ORAL
  Filled 2015-04-13 (×7): qty 1

## 2015-04-13 NOTE — BH Assessment (Signed)
[place] Assessment Progress Note   Clinician obtained pt signature on voluntary paperwork.

## 2015-04-13 NOTE — ED Notes (Signed)
Pt states that he is here for medical clearance to go across the street to [facility].  Pt states that he and wife are having marital problems and are discussing separation.  Wife at bedside has black eye.  States that he had suicidal ideations yesterday.  Pt states that he has had struggles with etoh but has not drank since 4/10.  States that he "will never drink again".  Pt states that [facility] has a bed for him upon medical clearance.

## 2015-04-13 NOTE — ED Notes (Signed)
Notified by Claiborne Billings, the [facility] at [facility] that the pt had been approved to go to 400- Bed 2. Number to call report is [phone].

## 2015-04-13 NOTE — Plan of Care (Signed)
Patient is a 49 year old white man that reports SI with a plan to shot himself in the head with a gun.  Patient reports that he is going to take a tarp and lay it on the floor, write a suicide letter to his family asking them no to come downstairs and look at him.  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  Patient reports that he does not want his family to see him this way.    Patient reports increased depression due to his wife asking for a divorce after being married for 23 years. Patient reports that has been drinking for a fifth of liquor weekly.  Patient reports that he has been drinking since he was 49 years old.  Patient reports that he has never been to detox or treatment.   Patient denies seizures.  Patient reports that when he was drunk last week he blacked out and threatened to kill himself and his wife.  Patient repots that he has 2 guns but his daughter has taken the guns out of the home.  His daughter's name is Steffanie Dunn and her number is [phone].   Patient reports that he receives outpatient therapy and medication management.  Patient denies HI and psychosis.  Patient denies prior psychiatric hospitalizations.

## 2015-04-13 NOTE — Progress Notes (Signed)
Per Russell Peters, TTS Counselor, patient was accepted to [facility] by Charmaine Downs earlier today. Patient assigned bed 400/2 at [facility]. Informed TCU staff of bed assignment. Clayborne Dana, RN

## 2015-04-13 NOTE — ED Notes (Signed)
Pt has wallet, phone and other belongings in bag in locker #27; including duffel bag

## 2015-04-13 NOTE — BH Assessment (Signed)
Writer informed the Charge Nurse that the patient will be coming for medical clearance.  Writer has contacted [place].

## 2015-04-13 NOTE — ED Notes (Signed)
Notified Dr. Ralene Bathe that the pt was accepted to bed 400-2 at [facility]

## 2015-04-13 NOTE — ED Provider Notes (Signed)
CSN: [id number]     Arrival date & time 04/13/15  1407 History  This chart was scribed for non-physician practitioner, Monico Blitz, working with Davonna Belling, MD by Molli Posey, ED Scribe. This patient was seen in room WTR4/WLPT4 and the patient's care was started at 2:35 PM.  Chief Complaint  Patient presents with  . Depression   The history is provided by the patient. No language interpreter was used.    HPI Comments: Russell Peters is a 49 y.o. male with a history of ADD, HTN, GERD and thyroid disease who presents to the Emergency Department complaining of recurrent depression. Pt states that he has had depression most of his life and has been on antidepressants over 20 years prescribed by his psychiatrist. He states he currently "cannot get it together" and says he cries a lot and can not go to work. Pt states that he waved a gun around 1.5 weeks ago and about 7 months ago threatening to hurt himself. He states he no longer has access to guns. Pt reports that his last alcoholic drink was 1.5 weeks ago. He states that he is feeling well without having alcohol but begins to feel sad which is why he starts to drink. Pt states he takes Wellbutrin, adderall, synthroid, lisinopril and Prilosec. Pt states that he feels physically tired at this time and states he has a decreased appetite recently. He denies CP, SOB and HA.   Past Medical History  Diagnosis Date  . Allergy   . ADD (attention deficit disorder)   . Hypertension   . GERD (gastroesophageal reflux disease)   . Thyroid disease   . Sleep apnea    History reviewed. No pertinent past surgical history. History reviewed. No pertinent family history. History  Substance Use Topics  . Smoking status: Former Smoker    Quit date: 12/23/1998  . Smokeless tobacco: Never Used  . Alcohol Use: Yes     Comment: occ    Review of Systems  A complete 10 system review of systems was obtained and all systems are negative except as  noted in the HPI and PMH.   Allergies  Penicillins  Home Medications   Prior to Admission medications   Medication Sig Start Date End Date Taking? Authorizing Provider  amphetamine-dextroamphetamine (ADDERALL XR) 25 MG 24 hr capsule Take 25 mg by mouth daily.     Historical Provider, MD  buPROPion (WELLBUTRIN XL) 300 MG 24 hr tablet Take 450 mg by mouth daily.     Historical Provider, MD  citalopram (CELEXA) 40 MG tablet Take 40 mg by mouth daily.      Historical Provider, MD  Docosahexaenoic Acid 300 MG CAPS Take 1 capsule by mouth daily.    Historical Provider, MD  HYDROcodone-acetaminophen (NORCO/VICODIN) 5-325 MG per tablet Take 1-2 tablets by mouth every 4 (four) hours as needed. Patient not taking: Reported on 04/02/2015 08/14/14   Charlann Lange, PA-C  lisinopril-hydrochlorothiazide (PRINZIDE,ZESTORETIC) 10-12.5 MG per tablet TAKE 1 TABLET BY MOUTH ONCE DAILY. Patient taking differently: Take 1 tablet by mouth once daily. 02/13/15   Denita Lung, MD  omeprazole (PRILOSEC) 20 MG capsule Take 20 mg by mouth daily.      Historical Provider, MD  SYNTHROID 88 MCG tablet Take 1 tablet (88 mcg total) by mouth daily. 01/10/14   Denita Lung, MD  SYNTHROID 88 MCG tablet TAKE 1 TABLET BY MOUTH ONCE DAILY. Patient not taking: Reported on 04/02/2015 02/17/15   Denita Lung, MD  triamcinolone cream (KENALOG) 0.5 % Apply 1 application topically 3 (three) times daily. Patient not taking: Reported on 04/02/2015 03/07/14   Denita Lung, MD  vardenafil (LEVITRA) 20 MG tablet Take 1 tablet (20 mg total) by mouth daily as needed for erectile dysfunction. 03/24/12 04/23/12  Denita Lung, MD   BP 134/100 mmHg  Pulse 94  Temp(Src) 98.6 F (37 C) (Oral)  Resp 18  SpO2 97%   Physical Exam  Constitutional: He is oriented to person, place, and time. He appears well-developed and well-nourished.  HENT:  Head: Normocephalic and atraumatic.  Mouth/Throat: Oropharynx is clear and moist.  Eyes: Pupils are  equal, round, and reactive to light. Right eye exhibits no discharge. Left eye exhibits no discharge.  Neck: Normal range of motion. Neck supple. No tracheal deviation present.  Cardiovascular: Normal rate and regular rhythm.   Pulmonary/Chest: Effort normal and breath sounds normal. No respiratory distress.  Abdominal: Soft. He exhibits no distension.  Neurological: He is alert and oriented to person, place, and time.  Follows commands, Clear, goal oriented speech, Strength is 5 out of 5x4 extremities, patient ambulates with a coordinated in nonantalgic gait. Sensation is grossly intact.   Skin: Skin is warm and dry.  Psychiatric: His speech is delayed. He is slowed. He exhibits a depressed mood. He expresses suicidal ideation. He expresses no homicidal ideation.  Minimal eye contact  Nursing note and vitals reviewed.   ED Course  Procedures   DIAGNOSTIC STUDIES: Oxygen Saturation is 97% on RA, normal by my interpretation.    COORDINATION OF CARE: 2:41 PM Discussed treatment plan with pt at bedside and pt agreed to plan.  Labs Review Labs Reviewed  ACETAMINOPHEN LEVEL - Abnormal; Notable for the following:    Acetaminophen (Tylenol), Serum <10.0 (*)    All other components within normal limits  COMPREHENSIVE METABOLIC PANEL - Abnormal; Notable for the following:    Glucose, Bld 109 (*)    Total Bilirubin 1.3 (*)    GFR calc non Af Amer 64 (*)    GFR calc Af Amer 74 (*)    All other components within normal limits  URINE RAPID DRUG SCREEN (HOSP PERFORMED) - Abnormal; Notable for the following:    Amphetamines POSITIVE (*)    All other components within normal limits  CBC  ETHANOL  SALICYLATE LEVEL    Imaging Review No results found.   EKG Interpretation None      MDM   Final diagnoses:  None   Filed Vitals:   04/13/15 1418  BP: 134/100  Pulse: 94  Temp: 98.6 F (37 C)  TempSrc: Oral  Resp: 18  SpO2: 97%    Russell Peters is a pleasant 49 y.o. male  presenting voluntarily with depression and suicidal ideation. Patient has a bed waiting for him at behavioral health, needs medical clearance.  Patient is medically cleared for psychiatric evaluation will be transferred to the psych ED. [facility] consulted, home meds and psych standard holding orders placed.   [facility] will have a bed for him sometime later in the evening. Patient is moved to the psych ED pending bed opening.   I personally performed the services described in this documentation, which was scribed in my presence. The recorded information has been reviewed and is accurate.      Monico Blitz, PA-C 04/13/15 [place], MD 04/14/15 [phone]

## 2015-04-13 NOTE — Tx Team (Signed)
Initial Interdisciplinary Treatment Plan   PATIENT STRESSORS: Marital or family conflict Medication change or noncompliance Occupational concerns Substance abuse   PATIENT STRENGTHS: Ability for insight Active sense of humor Average or above average intelligence Capable of independent living Occupational psychologist fund of knowledge Motivation for treatment/growth Physical Health Special hobby/interest Supportive family/friends Work skills   PROBLEM LIST: Problem List/Patient Goals Date to be addressed Date deferred Reason deferred Estimated date of resolution  "self esteem is a bad problem" 04/13/2015     "Coping with life" 04/13/2015           Depression 04/13/2015     Increased risk for suicide 04/13/2015     Substance abuse 04/13/2015                        DISCHARGE CRITERIA:  Ability to meet basic life and health needs Adequate post-discharge living arrangements Improved stabilization in mood, thinking, and/or behavior Medical problems require only outpatient monitoring Motivation to continue treatment in a less acute level of care Need for constant or close observation no longer present Reduction of life-threatening or endangering symptoms to within safe limits Safe-care adequate arrangements made Verbal commitment to aftercare and medication compliance Withdrawal symptoms are absent or subacute and managed without 24-hour nursing intervention  PRELIMINARY DISCHARGE PLAN: Attend 12-step recovery group Participate in family therapy Return to previous living arrangement  PATIENT/FAMIILY INVOLVEMENT: This treatment plan has been presented to and reviewed with the patient, Russell Peters, and/or family member.  The patient and family have been given the opportunity to ask questions and make suggestions.  Russell Peters 04/13/2015, 11:08 PM

## 2015-04-14 ENCOUNTER — Encounter (HOSPITAL_COMMUNITY): Payer: Self-pay | Admitting: Psychiatry

## 2015-04-14 DIAGNOSIS — F332 Major depressive disorder, recurrent severe without psychotic features: Principal | ICD-10-CM

## 2015-04-14 DIAGNOSIS — F1010 Alcohol abuse, uncomplicated: Secondary | ICD-10-CM | POA: Insufficient documentation

## 2015-04-14 LAB — RAPID URINE DRUG SCREEN, HOSP PERFORMED
AMPHETAMINES: POSITIVE — AB
BARBITURATES: NOT DETECTED
BENZODIAZEPINES: NOT DETECTED
COCAINE: NOT DETECTED
Opiates: NOT DETECTED
TETRAHYDROCANNABINOL: NOT DETECTED

## 2015-04-14 MED ORDER — AMPHETAMINE-DEXTROAMPHET ER 5 MG PO CP24
15.0000 mg | ORAL_CAPSULE | Freq: Every day | ORAL | Status: DC
  Administered 2015-04-15 – 2015-04-17 (×3): 15 mg via ORAL
  Filled 2015-04-14 (×6): qty 1

## 2015-04-14 MED ORDER — AMPHETAMINE-DEXTROAMPHET ER 10 MG PO CP24: 20.0000 mg | ORAL_CAPSULE | Freq: Every day | ORAL | Status: DC

## 2015-04-14 MED ORDER — UNJURY CHOCOLATE CLASSIC POWDER
2.0000 [oz_av] | Freq: Two times a day (BID) | ORAL | Status: DC
  Administered 2015-04-14: 2 [oz_av] via ORAL
  Filled 2015-04-14 (×12): qty 27

## 2015-04-14 MED ORDER — SERTRALINE HCL 50 MG PO TABS
50.0000 mg | ORAL_TABLET | Freq: Every day | ORAL | Status: DC
  Administered 2015-04-15 – 2015-04-17 (×3): 50 mg via ORAL
  Filled 2015-04-14 (×5): qty 1

## 2015-04-14 NOTE — [facility] Group Notes (Signed)
   [facility] LCSW Aftercare Discharge Planning Group Note  04/14/2015  8:45 AM   Participation Quality: Alert, Appropriate and Oriented  Mood/Affect: Depressed and Flat  Depression Rating: 7  Anxiety Rating: 5-6  Thoughts of Suicide: Pt denies SI/HI  Will you contract for safety? Yes  Current AVH: Pt denies  Plan for Discharge/Comments: Pt attended discharge planning group and actively participated in group. CSW provided pt with today's workbook. Patient plans to return home and follow up with outpatient services. Patient currently sees Vanetta Mulders for therapy and [name] for medications but would like a referral to another provider for medications.   Transportation Means: Pt reports access to transportation  Supports: No supports mentioned at this time  Tilden Fossa, MSW, [place] Social Worker [employer] [phone]

## 2015-04-14 NOTE — [facility] Group Notes (Signed)
[facility] LCSW Group Therapy 04/14/2015 1:15 PM Type of Therapy: Group Therapy Participation Level: Active  Participation Quality: Attentive, Sharing and Supportive  Affect: Depressed and Flat  Cognitive: Alert and Oriented  Insight: Developing/Improving and Engaged  Engagement in Therapy: Developing/Improving and Engaged  Modes of Intervention: Clarification, Confrontation, Discussion, Education, Exploration, Limit-setting, Orientation, Problem-solving, Rapport Building, Art therapist, Socialization and Support  Summary of Progress/Problems: The topic for today was feelings about relapse. Pt discussed what relapse prevention is to them and identified triggers that they are on the path to relapse. Pt processed their feeling towards relapse and was able to relate to peers. Pt discussed coping skills that can be used for relapse prevention. Patient discussed his relationship issues with his wife with group and received support and encouragement from others. Patient shared that he has difficulty accepting responsibility for his actions at times and often places blame with others.    Tilden Fossa, MSW, [place] Worker [facility] [phone]

## 2015-04-14 NOTE — Progress Notes (Signed)
Pt was pleasant and cooperative during the adm process. When asked the circumstances surrounding his adm pt stated "he saw his psychiatrist Tues and she suggested he come in here." Stated "my downward spiral is more than she can handle. I cry a lot". Pt stated that since he and his wife have been having problems he's cut back on his drinking "a lot".  States he now drinks apprx 1 pt a week, but will drink until he falls asleep. Stated he promised his grandchild that he would stop drinking. Pt denied SI, HI, and A/V on adm

## 2015-04-14 NOTE — Tx Team (Signed)
Interdisciplinary Treatment Plan Update (Adult) Date: 04/14/2015   Time Reviewed: 9:30 AM  Progress in Treatment: Attending groups: Continuing to assess, patient new to milieu Participating in groups: Continuing to assess, patient new to milieu Taking medication as prescribed: Yes Tolerating medication: Yes Family/Significant other contact made: Continuing to assess for appropriate contacts Patient understands diagnosis: Yes Discussing patient identified problems/goals with staff: Yes Medical problems stabilized or resolved: Yes Denies suicidal/homicidal ideation: Yes Issues/concerns per patient self-inventory: Yes Other:  New problem(s) identified: N/A  Discharge Plan or Barriers:   4/22: Patient plans to return home to follow up with outpatient services.  Reason for Continuation of Hospitalization:  Depression Anxiety Medication Stabilization   Comments: N/A  Estimated length of stay: 3-5 days  For review of initial/current patient goals, please see plan of care.  Patient is a 49 year old Male admitted for SI and depression.  Patient will benefit from crisis stabilization, medication evaluation, group therapy, and psycho education in addition to case management for discharge planning. Patient and CSW reviewed pt's identified goals and treatment plan. Pt verbalized understanding and agreed to treatment plan.   Attendees: Patient:    Family:    Physician: Dr. Parke Poisson; Dr. Sabra Heck 04/14/2015 9:30 AM  Nursing: Markham Jordan, Charlyne Quale, Nehemiah Settle , RN 04/14/2015 9:30 AM  Clinical Social Worker: Tilden Fossa,  [place] 04/14/2015 9:30 AM  Other: Maxie Better, LCSWA  04/14/2015 9:30 AM  Other:  04/14/2015 9:30 AM  Other: Lars Pinks, Case Manager 04/14/2015 9:30 AM  Other: Ave Filter NP 04/14/2015 9:30 AM  Other:    Other:    Other:    Other:    Other:      Scribe for Treatment Team:  Tilden Fossa, MSW, [employer] [phone]

## 2015-04-14 NOTE — [facility] Suicide Risk Assessment (Signed)
[facility] Admission Suicide Risk Assessment   Nursing information obtained from:  Patient Demographic factors:  Male, Caucasian Current Mental Status:  NA Loss Factors:  Loss of significant relationship Historical Factors:  Family history of mental illness or substance abuse, Anniversary of important loss, Impulsivity Risk Reduction Factors:  Sense of responsibility to family, Religious beliefs about death, Employed, Living with another person, especially a relative, Positive social support Total Time spent with patient: 45 minutes Principal Problem: Major depressive disorder, recurrent episode, severe Diagnosis:   Patient Active Problem List   Diagnosis Date Noted  . Major depressive disorder, recurrent episode, severe [F33.2] 04/13/2015  . Alcohol dependence with intoxication, uncomplicated [S28.315] 17/61/6073  . Substance induced mood disorder [F19.94] 04/04/2015  . Hypothyroid [E03.9] 05/16/2011  . Hypertension [I10] 05/16/2011  . Obesity [E66.9] 05/16/2011     Continued Clinical Symptoms:  Alcohol Use Disorder Identification Test Final Score (AUDIT): 20 The "Alcohol Use Disorders Identification Test", Guidelines for Use in Primary Care, Second Edition.  World Pharmacologist [facility]). Score between 0-7:  no or low risk or alcohol related problems. Score between 8-15:  moderate risk of alcohol related problems. Score between 16-19:  high risk of alcohol related problems. Score 20 or above:  warrants further diagnostic evaluation for alcohol dependence and treatment.   CLINICAL FACTORS:  49 year old married male, who presents to ED for depression. States that he has been facing some significant stressors/losses. His father passed away last year. His relationship with his wife has been strained and they have been discussing separation. He has been feeling depressed and easily moved to tears for several  Months, but getting worse. Earlier this week he drank alcohol heavily , blacked  out, and states that in the context of blackout apparently took a gun and made threats to hurt self and was very agitated. He states he does not remember episode as was blacked out, but states that he is not suicidal and that " it was the alcohol making me do that". He does present depressed, sad, tearful at times, but is able to contract for safety at this time. He does have a history of Alcohol Abuse in binges, and at least one prior episode of agitation and threats in the context of intoxication several months ago. Last drank several days ago and is not presenting with WDL symptoms at present  Dx- MDD, without psychotic symptoms, Alcohol Abuse  Plan- admit to inpatient psychiatric unit, continue Wellbutrin XL 450 mgrs QDAY, decrease Adderall dose to 20 mgrs QAM, and  Start Zoloft 50 mgrs QDAY   Musculoskeletal: Strength & Muscle Tone: within normal limits Gait & Station: normal Patient leans: N/A  Psychiatric Specialty Exam: Physical Exam  Review of Systems  Constitutional: Negative for fever and chills.  Eyes: Negative.   Respiratory: Negative for cough and shortness of breath.   Cardiovascular: Negative for chest pain.  Gastrointestinal: Positive for constipation. Negative for nausea and vomiting.  Genitourinary: Negative for dysuria, urgency and frequency.  Skin: Negative for rash.  Neurological: Negative for seizures and headaches.  Endo/Heme/Allergies: Negative.   Psychiatric/Behavioral: Positive for depression and substance abuse.    Blood pressure 127/97, pulse 96, temperature 98.3 F (36.8 C), temperature source Oral, resp. rate 18, height 5' 10.5" (1.791 m), weight 253 lb (114.76 kg).Body mass index is 35.78 kg/(m^2).  General Appearance: Well Groomed  Eye Contact::  Good  Speech:  Normal Rate  Volume:  Normal  Mood:  Anxious and Depressed  Affect:  Congruent and  Constricted  Thought Process:  Goal Directed and Linear  Orientation:  Full (Time, Place, and Person)   Thought Content:  denies hallucinations, no delusions, ruminative about marital stressors and recent events  Suicidal Thoughts:  No- at this time denies any thoughts of hurting self or anyone else   Homicidal Thoughts:  No  Memory:   Recent and remote fair, no recollection  Of certain recent events due to alcohol related black out   Judgement:  Fair  Insight:  Fair  Psychomotor Activity:  Normal  Concentration:  Good  Recall:  Good  Fund of Knowledge:Good  Language: Good  Akathisia:  Negative  Handed:  Right  AIMS (if indicated):     Assets:  Communication Skills Desire for Improvement Housing Resilience Talents/Skills Vocational/Educational  Sleep:  Number of Hours: 5.75  Cognition: WNL  ADL's:  Impaired     COGNITIVE FEATURES THAT CONTRIBUTE TO RISK:  Closed-mindedness    SUICIDE RISK:   Moderate:  Frequent suicidal ideation with limited intensity, and duration, some specificity in terms of plans, no associated intent, good self-control, limited dysphoria/symptomatology, some risk factors present, and identifiable protective factors, including available and accessible social support.  PLAN OF CARE: Patient will be admitted to inpatient psychiatric unit for stabilization and safety. Will provide and encourage milieu participation. Provide medication management and maked adjustments as needed.  Will follow daily.    Medical Decision Making:  Review of Psycho-Social Stressors (1), Review or order clinical lab tests (1), Established Problem, Worsening (2) and Review of Medication Regimen & Side Effects (2)  I certify that inpatient services furnished can reasonably be expected to improve the patient's condition.   Sonji Starkes 04/14/2015, 2:58 PM

## 2015-04-14 NOTE — Progress Notes (Signed)
D:  Per pt self inventory pt reports sleeping poor last night--pt states that he did not take any sleep medication to at bedtime last night, appetite fair, energy level low, ability to pay attention good, rates depression at a 7 out of 10, hopelessness at a 5 out of 10 and anxiety at an 8 out of 10, flat/depressed during interaction, no complaints this am, denies SI/HI/AVH presently.    A:  Emotional support provided, Encouraged pt to continue with treatment plan and attend all group activities, q15 min checks maintained for safety.  R:  Pt is receptive, has been visible in dayroom, pleasant and cooperative with staff and other patients.

## 2015-04-14 NOTE — H&P (Signed)
Psychiatric Admission Assessment Adult  Patient Identification: Russell Peters MRN:  [id number] Date of Evaluation:  04/14/2015 Chief Complaint:  "I have been feeling so depressed."  Principal Diagnosis: Major depressive disorder, recurrent episode, severe Diagnosis:   Patient Active Problem List   Diagnosis Date Noted  . Alcohol abuse [F10.10]   . Major depressive disorder, recurrent episode, severe [F33.2] 04/13/2015  . Alcohol dependence with intoxication, uncomplicated [G26.948] 54/62/7035  . Substance induced mood disorder [F19.94] 04/04/2015  . Hypothyroid [E03.9] 05/16/2011  . Hypertension [I10] 05/16/2011  . Obesity [E66.9] 05/16/2011   History of Present Illness::   Russell Peters is a 49 year old man who initially presented to [facility] as a walk in. The patient reported that his therapist had referred him after he reported suicidal thoughts during a session. He endorsed suicidal thoughts to shoot himself or overdose on medications. The patient also had an incident 04/03/15 where he got intoxicated and threatened to kill himself. At that time he was sent to [facility] and placed under IVC but was not admitted to the hospital. Russell Peters reported worsening depression since finding out his wife of 23 years was cheating on him and recently losing his father to cancer last year in June. Patient stated the following today during his psychiatric assessment "I got really sad a week ago. I could not get control of myself and was crying. I am struggling with my wife. We have decided to separate. She has been having an affair right under my nose.  I guess I could not handle the emotions after we talked. I decided to drink some alcohol to numb myself for a while. I don't remember it all. I drove to my step grandson's house and was driven back home. I have been told that I was waving a gun around threatening myself and my wife. I also thought about using a nail-gun to kill myself with. I have had depression most  of my life. Sometimes it gets really bad. I see [name] for my medications. I have been on Wellbutrin for many years and do not think it helps anymore. I took myself off Celexa over a few months because of sexual side effects. I use Adderall for my ADHD. I know the alcohol made things worse. I had been trying to cut back because it was causing problems with my wife. I have not been drinking heavy though and am not having withdrawal symptoms." Russell Peters was cooperative and pleasant during his assessment. He denied any symptoms of psychosis. Patient denied periods of true mania reporting that his irrational behaviors all occurred under the influence of alcohol. Patient's mood appeared depressed throughout the assessment. He contracts for safety on the unit.   Elements:  Location:  [facility] unit. Quality:  Depressive symptoms, suicidal gestures, alcohol use. Severity:  Severe . Timing:  Last few weeks. Duration:  Chronic . Context:  impending separation from wife, death of father, alcohol abuse . Associated Signs/Symptoms: Depression Symptoms:  depressed mood, anhedonia, insomnia, psychomotor retardation, fatigue, feelings of worthlessness/guilt, hopelessness, recurrent thoughts of death, suicidal thoughts with specific plan, anxiety, loss of energy/fatigue, decreased labido, (Hypo) Manic Symptoms:  Denies Anxiety Symptoms:  Denies Psychotic Symptoms:  Denies PTSD Symptoms: NA Total Time spent with patient: 1 hour  Past Medical History:  Past Medical History  Diagnosis Date  . Allergy   . ADD (attention deficit disorder)   . Hypertension   . GERD (gastroesophageal reflux disease)   . Thyroid disease   . Sleep  apnea   . Depression     Past Surgical History  Procedure Laterality Date  . No past surgeries     Family History: History reviewed. No pertinent family history. Social History:  History  Alcohol Use  . Yes    Comment: 3 beers weekly in past 8 months; 1 pt weekly  liquor     History  Drug Use No    History   Social History  . Marital Status: Married    Spouse Name: N/A  . Number of Children: N/A  . Years of Education: N/A   Social History Main Topics  . Smoking status: Former Smoker    Quit date: 12/23/1998  . Smokeless tobacco: Never Used  . Alcohol Use: Yes     Comment: 3 beers weekly in past 8 months; 1 pt weekly liquor  . Drug Use: No  . Sexual Activity: No   Other Topics Concern  . None   Social History Narrative   Additional Social History:    Pain Medications: see mar Prescriptions: see mar Over the Counter: see mar History of alcohol / drug use?: Yes Longest period of sobriety (when/how long): 2 months in 2016 Negative Consequences of Use: Personal relationships Name of Substance 1: Alcohol 1 - Age of First Use: 19 1 - Amount (size/oz): varies 1 - Frequency: varjes 1 - Duration: on  going 1 - Last Use / Amount: Sundaty 11th of April                   Musculoskeletal: Strength & Muscle Tone: within normal limits Gait & Station: normal Patient leans: N/A  Psychiatric Specialty Exam: Physical Exam  Constitutional:  Physical exam findings reviewed from the [place] completed on 04/13/15 and I concur with no noted exceptions.   Psychiatric: His speech is normal and behavior is normal. Cognition and memory are normal. He expresses impulsivity. He exhibits a depressed mood. He expresses homicidal and suicidal ideation.    Review of Systems  Constitutional: Negative for fever, chills, weight loss, malaise/fatigue and diaphoresis.  HENT: Negative for congestion, ear discharge, ear pain, hearing loss, nosebleeds, sore throat and tinnitus.   Eyes: Negative for blurred vision, double vision, photophobia, pain, discharge and redness.  Respiratory: Negative for cough, hemoptysis, sputum production, shortness of breath, wheezing and stridor.   Cardiovascular: Negative for chest pain, palpitations, orthopnea, claudication,  leg swelling and PND.  Gastrointestinal: Positive for constipation. Negative for heartburn, nausea, vomiting, abdominal pain, diarrhea, blood in stool and melena.  Genitourinary: Negative for dysuria, urgency, frequency, hematuria and flank pain.  Musculoskeletal: Negative for myalgias, back pain, joint pain, falls and neck pain.  Skin: Negative for itching and rash.  Neurological: Negative for dizziness, tingling, tremors, sensory change, speech change, focal weakness, seizures, loss of consciousness, weakness and headaches.  Endo/Heme/Allergies: Negative for environmental allergies and polydipsia. Does not bruise/bleed easily.  Psychiatric/Behavioral: Positive for depression, suicidal ideas and substance abuse. Negative for hallucinations and memory loss. The patient is nervous/anxious and has insomnia.     Blood pressure 127/97, pulse 96, temperature 98.3 F (36.8 C), temperature source Oral, resp. rate 18, height 5' 10.5" (1.791 m), weight 114.76 kg (253 lb).Body mass index is 35.78 kg/(m^2).  General Appearance: Well Groomed  Engineer, water::  Good  Speech:  Normal Rate  Volume:  Normal  Mood:  Anxious and Depressed  Affect:  Congruent and Constricted  Thought Process:  Goal Directed and Linear  Orientation:  Full (Time, Place, and Person)  Thought  Content:  denies hallucinations, no delusions, ruminative about marital stressors and recent events  Suicidal Thoughts:  No  Homicidal Thoughts:  No  Memory:  Recent and remote fair, no recollection Of certain recent events due to alcohol related black out   Judgement:  Fair  Insight:  Fair  Psychomotor Activity:  Normal  Concentration:  Good  Recall:  Good  Fund of Knowledge:Good  Language: Good  Akathisia:  Negative  Handed:  Right  AIMS (if indicated):     Assets:  Communication Skills Desire for Improvement Housing Leisure Time Resilience Talents/Skills Vocational/Educational  ADL's:  Intact  Cognition: WNL  Sleep:   Number of Hours: 5.75   Risk to Self: Is patient at risk for suicide?: Yes What has been your use of drugs/alcohol within the last 12 months?: Daily drinking prior to 8 months ago; excessive ETOH use occasionally Risk to Others:   Prior Inpatient Therapy:   Prior Outpatient Therapy:    Alcohol Screening: 1. How often do you have a drink containing alcohol?: 2 to 4 times a month 2. How many drinks containing alcohol do you have on a typical day when you are drinking?: 10 or more 3. How often do you have six or more drinks on one occasion?: Weekly Preliminary Score: 7 4. How often during the last year have you found that you were not able to stop drinking once you had started?: Weekly 5. How often during the last year have you failed to do what was normally expected from you becasue of drinking?: Never 6. How often during the last year have you needed a first drink in the morning to get yourself going after a heavy drinking session?: Never 7. How often during the last year have you had a feeling of guilt of remorse after drinking?: Weekly 8. How often during the last year have you been unable to remember what happened the night before because you had been drinking?: Less than monthly 9. Have you or someone else been injured as a result of your drinking?: No 10. Has a relative or friend or a doctor or another health worker been concerned about your drinking or suggested you cut down?: Yes, during the last year Alcohol Use Disorder Identification Test Final Score (AUDIT): 20 Brief Intervention: MD notified of score 20 or above  Allergies:   Allergies  Allergen Reactions  . Penicillins     Unknown reaction.    Lab Results:  Results for orders placed or performed during the hospital encounter of 04/13/15 (from the past 48 hour(s))  Urine Drug Screen     Status: Abnormal   Collection Time: 04/13/15  2:07 PM  Result Value Ref Range   Opiates NONE DETECTED NONE DETECTED   Cocaine NONE  DETECTED NONE DETECTED   Benzodiazepines NONE DETECTED NONE DETECTED   Amphetamines POSITIVE (A) NONE DETECTED    Comment: DELTA CHECK NOTED   Tetrahydrocannabinol NONE DETECTED NONE DETECTED   Barbiturates NONE DETECTED NONE DETECTED    Comment:        DRUG SCREEN FOR MEDICAL PURPOSES ONLY.  IF CONFIRMATION IS NEEDED FOR ANY PURPOSE, NOTIFY LAB WITHIN 5 DAYS.        LOWEST DETECTABLE LIMITS FOR URINE DRUG SCREEN Drug Class       Cutoff (ng/mL) Amphetamine      1000 Barbiturate      200 Benzodiazepine   242 Tricyclics       353 Opiates  300 Cocaine          300 THC              50   Acetaminophen level     Status: Abnormal   Collection Time: 04/13/15  2:35 PM  Result Value Ref Range   Acetaminophen (Tylenol), Serum <10.0 (L) 10 - 30 ug/mL    Comment:        THERAPEUTIC CONCENTRATIONS VARY SIGNIFICANTLY. A RANGE OF 10-30 ug/mL MAY BE AN EFFECTIVE CONCENTRATION FOR MANY PATIENTS. HOWEVER, SOME ARE BEST TREATED AT CONCENTRATIONS OUTSIDE THIS RANGE. ACETAMINOPHEN CONCENTRATIONS >150 ug/mL AT 4 HOURS AFTER INGESTION AND >50 ug/mL AT 12 HOURS AFTER INGESTION ARE OFTEN ASSOCIATED WITH TOXIC REACTIONS.   CBC     Status: None   Collection Time: 04/13/15  2:35 PM  Result Value Ref Range   WBC 8.3 4.0 - 10.5 K/uL   RBC 5.15 4.22 - 5.81 MIL/uL   Hemoglobin 16.0 13.0 - 17.0 g/dL   HCT 46.8 39.0 - 52.0 %   MCV 90.9 78.0 - 100.0 fL   MCH 31.1 26.0 - 34.0 pg   MCHC 34.2 30.0 - 36.0 g/dL   RDW 12.9 11.5 - 15.5 %   Platelets 278 150 - 400 K/uL  Comprehensive metabolic panel     Status: Abnormal   Collection Time: 04/13/15  2:35 PM  Result Value Ref Range   Sodium 141 135 - 145 mmol/L   Potassium 4.0 3.5 - 5.1 mmol/L   Chloride 105 96 - 112 mmol/L   CO2 26 19 - 32 mmol/L   Glucose, Bld 109 (H) 70 - 99 mg/dL   BUN 18 6 - 23 mg/dL   Creatinine, Ser 1.30 0.50 - 1.35 mg/dL   Calcium 9.4 8.4 - 10.5 mg/dL   Total Protein 7.5 6.0 - 8.3 g/dL   Albumin 4.8 3.5 - 5.2 g/dL    AST 29 0 - 37 U/L   ALT 36 0 - 53 U/L   Alkaline Phosphatase 61 39 - 117 U/L   Total Bilirubin 1.3 (H) 0.3 - 1.2 mg/dL   GFR calc non Af Amer 64 (L) >90 mL/min   GFR calc Af Amer 74 (L) >90 mL/min    Comment: (NOTE) The eGFR has been calculated using the CKD EPI equation. This calculation has not been validated in all clinical situations. eGFR's persistently <90 mL/min signify possible Chronic Kidney Disease.    Anion gap 10 5 - 15  Ethanol (ETOH)     Status: None   Collection Time: 04/13/15  2:35 PM  Result Value Ref Range   Alcohol, Ethyl (B) <5 0 - 9 mg/dL    Comment:        LOWEST DETECTABLE LIMIT FOR SERUM ALCOHOL IS 11 mg/dL FOR MEDICAL PURPOSES ONLY   Salicylate level     Status: None   Collection Time: 04/13/15  2:35 PM  Result Value Ref Range   Salicylate Lvl <4.0 2.8 - 20.0 mg/dL   Current Medications: Current Facility-Administered Medications  Medication Dose Route Frequency Provider Last Rate Last Dose  . acetaminophen (TYLENOL) tablet 650 mg  650 mg Oral Q6H PRN Harriet Butte, NP      . alum & mag hydroxide-simeth (MAALOX/MYLANTA) 200-200-20 MG/5ML suspension 30 mL  30 mL Oral Q4H PRN Harriet Butte, NP      . Derrill Memo ON 04/15/2015] amphetamine-dextroamphetamine (ADDERALL XR) 24 hr capsule 15 mg  15 mg Oral Daily Jenne Campus, MD      .  buPROPion (WELLBUTRIN XL) 24 hr tablet 450 mg  450 mg Oral Daily Jenne Campus, MD   450 mg at 04/14/15 0807  . lisinopril (PRINIVIL,ZESTRIL) tablet 10 mg  10 mg Oral Daily Jenne Campus, MD   10 mg at 04/14/15 7510   And  . hydrochlorothiazide (MICROZIDE) capsule 12.5 mg  12.5 mg Oral Daily Jenne Campus, MD   12.5 mg at 04/14/15 0807  . levothyroxine (SYNTHROID, LEVOTHROID) tablet 88 mcg  88 mcg Oral Daily Harriet Butte, NP   88 mcg at 04/14/15 0807  . magnesium hydroxide (MILK OF MAGNESIA) suspension 30 mL  30 mL Oral Daily PRN Harriet Butte, NP      . pantoprazole (PROTONIX) EC tablet 40 mg  40 mg Oral Daily  Harriet Butte, NP   40 mg at 04/14/15 0808  . protein supplement (UNJURY CHOCOLATE CLASSIC) powder 2 oz  2 oz Oral BID BM Dorann Ou, RD   2 oz at 04/14/15 1527  . [START ON 04/15/2015] sertraline (ZOLOFT) tablet 50 mg  50 mg Oral Daily Myer Peer Lavida Patch, MD      . traZODone (DESYREL) tablet 50 mg  50 mg Oral QHS PRN Harriet Butte, NP       PTA Medications: Prescriptions prior to admission  Medication Sig Dispense Refill Last Dose  . amphetamine-dextroamphetamine (ADDERALL XR) 25 MG 24 hr capsule Take 25 mg by mouth daily.    04/13/2015 at Unknown time  . buPROPion (WELLBUTRIN XL) 150 MG 24 hr tablet Take 150 mg by mouth daily.   04/13/2015 at Unknown time  . buPROPion (WELLBUTRIN XL) 300 MG 24 hr tablet Take 450 mg by mouth daily.    04/13/2015 at Unknown time  . Docosahexaenoic Acid 300 MG CAPS Take 1 capsule by mouth daily.   04/13/2015 at Unknown time  . FIBER PO Take 1 tablet by mouth daily as needed (constipation).   04/13/2015 at Unknown time  . lisinopril-hydrochlorothiazide (PRINZIDE,ZESTORETIC) 10-12.5 MG per tablet TAKE 1 TABLET BY MOUTH ONCE DAILY. (Patient taking differently: Take 1 tablet by mouth once daily.) 30 tablet 2 04/13/2015 at Unknown time  . omeprazole (PRILOSEC) 20 MG capsule Take 20 mg by mouth daily.     04/13/2015 at Unknown time  . SYNTHROID 88 MCG tablet Take 1 tablet (88 mcg total) by mouth daily. 30 tablet 2 04/13/2015 at Unknown time  . HYDROcodone-acetaminophen (NORCO/VICODIN) 5-325 MG per tablet Take 1-2 tablets by mouth every 4 (four) hours as needed. (Patient not taking: Reported on 04/02/2015) 12 tablet 0 Completed Course at Unknown time  . triamcinolone cream (KENALOG) 0.5 % Apply 1 application topically 3 (three) times daily. (Patient not taking: Reported on 04/02/2015) 30 g 1 Completed Course at Unknown time  . vardenafil (LEVITRA) 20 MG tablet Take 1 tablet (20 mg total) by mouth daily as needed for erectile dysfunction. 2 tablet 0 More than a month at  Unknown time    Previous Psychotropic Medications: Yes   Substance Abuse History in the last 12 months:  Yes.   Patient reports a history of binge drinking in the past. He reports he has cut down his alcohol consumption over the past eight months. Earlier this week he drank alcohol heavily , blacked out, and states that in the context of blackout apparently took a gun and made threats to hurt self and was very agitated. He states he does not remember episode as was blacked out, but states that he is  not suicidal and that " it was the alcohol making me do that".   Consequences of Substance Abuse: Family Consequences:  Patient reports threatening family members when under the influence of alcohol.   Results for orders placed or performed during the hospital encounter of 04/13/15 (from the past 72 hour(s))  Urine Drug Screen     Status: Abnormal   Collection Time: 04/13/15  2:07 PM  Result Value Ref Range   Opiates NONE DETECTED NONE DETECTED   Cocaine NONE DETECTED NONE DETECTED   Benzodiazepines NONE DETECTED NONE DETECTED   Amphetamines POSITIVE (A) NONE DETECTED    Comment: DELTA CHECK NOTED   Tetrahydrocannabinol NONE DETECTED NONE DETECTED   Barbiturates NONE DETECTED NONE DETECTED    Comment:        DRUG SCREEN FOR MEDICAL PURPOSES ONLY.  IF CONFIRMATION IS NEEDED FOR ANY PURPOSE, NOTIFY LAB WITHIN 5 DAYS.        LOWEST DETECTABLE LIMITS FOR URINE DRUG SCREEN Drug Class       Cutoff (ng/mL) Amphetamine      1000 Barbiturate      200 Benzodiazepine   563 Tricyclics       149 Opiates          300 Cocaine          300 THC              50   Acetaminophen level     Status: Abnormal   Collection Time: 04/13/15  2:35 PM  Result Value Ref Range   Acetaminophen (Tylenol), Serum <10.0 (L) 10 - 30 ug/mL    Comment:        THERAPEUTIC CONCENTRATIONS VARY SIGNIFICANTLY. A RANGE OF 10-30 ug/mL MAY BE AN EFFECTIVE CONCENTRATION FOR MANY PATIENTS. HOWEVER, SOME ARE BEST  TREATED AT CONCENTRATIONS OUTSIDE THIS RANGE. ACETAMINOPHEN CONCENTRATIONS >150 ug/mL AT 4 HOURS AFTER INGESTION AND >50 ug/mL AT 12 HOURS AFTER INGESTION ARE OFTEN ASSOCIATED WITH TOXIC REACTIONS.   CBC     Status: None   Collection Time: 04/13/15  2:35 PM  Result Value Ref Range   WBC 8.3 4.0 - 10.5 K/uL   RBC 5.15 4.22 - 5.81 MIL/uL   Hemoglobin 16.0 13.0 - 17.0 g/dL   HCT 46.8 39.0 - 52.0 %   MCV 90.9 78.0 - 100.0 fL   MCH 31.1 26.0 - 34.0 pg   MCHC 34.2 30.0 - 36.0 g/dL   RDW 12.9 11.5 - 15.5 %   Platelets 278 150 - 400 K/uL  Comprehensive metabolic panel     Status: Abnormal   Collection Time: 04/13/15  2:35 PM  Result Value Ref Range   Sodium 141 135 - 145 mmol/L   Potassium 4.0 3.5 - 5.1 mmol/L   Chloride 105 96 - 112 mmol/L   CO2 26 19 - 32 mmol/L   Glucose, Bld 109 (H) 70 - 99 mg/dL   BUN 18 6 - 23 mg/dL   Creatinine, Ser 1.30 0.50 - 1.35 mg/dL   Calcium 9.4 8.4 - 10.5 mg/dL   Total Protein 7.5 6.0 - 8.3 g/dL   Albumin 4.8 3.5 - 5.2 g/dL   AST 29 0 - 37 U/L   ALT 36 0 - 53 U/L   Alkaline Phosphatase 61 39 - 117 U/L   Total Bilirubin 1.3 (H) 0.3 - 1.2 mg/dL   GFR calc non Af Amer 64 (L) >90 mL/min   GFR calc Af Amer 74 (L) >90 mL/min    Comment: (NOTE) The  eGFR has been calculated using the CKD EPI equation. This calculation has not been validated in all clinical situations. eGFR's persistently <90 mL/min signify possible Chronic Kidney Disease.    Anion gap 10 5 - 15  Ethanol (ETOH)     Status: None   Collection Time: 04/13/15  2:35 PM  Result Value Ref Range   Alcohol, Ethyl (B) <5 0 - 9 mg/dL    Comment:        LOWEST DETECTABLE LIMIT FOR SERUM ALCOHOL IS 11 mg/dL FOR MEDICAL PURPOSES ONLY   Salicylate level     Status: None   Collection Time: 04/13/15  2:35 PM  Result Value Ref Range   Salicylate Lvl <2.5 2.8 - 20.0 mg/dL    Observation Level/Precautions:  15 minute checks  Laboratory:  CBC Chemistry Profile UDS  Psychotherapy:   Individual and Group Therapy  Medications:  Continue Wellbutrin 450 mg daily with plan to taper, Start Zoloft 50 mg daily for depression, Decrease Adderall to 15 mg daily   Consultations:  As needed  Discharge Concerns:  Safety and Stability   Estimated LOS: 3-5 days  Other:  Increase collateral information from family    Psychological Evaluations: Yes   Treatment Plan Summary: Daily contact with patient to assess and evaluate symptoms and progress in treatment and Medication management   Treatment Plan/Recommendations:   1. Admit for crisis management and stabilization. Estimated length of stay 5-7 days. 2. Medication management to reduce current symptoms to base line and improve the patient's level of functioning.  3. Develop treatment plan to decrease risk of relapse upon discharge of depressive symptoms and the need for readmission. 5. Group therapy to facilitate development of healthy coping skills to use for depression and anxiety. 6. Health care follow up as needed for medical problems.  7. Discharge plan to include therapy to help patient cope with death of father and other stressors.  8. Call for Consult with Hospitalist for additional specialty patient services as needed.   Medical Decision Making:  New problem, with additional work up planned, Review or order clinical lab tests (1), Review of Medication Regimen & Side Effects (2) and Review of New Medication or Change in Dosage (2)  I certify that inpatient services furnished can reasonably be expected to improve the patient's condition.   Elmarie Shiley NP-C 4/22/20165:37 PM   Case discussed with NP and have met with patient. Agree with NP's Assessment and Note 49 year old married male, who presents to ED for depression. States that he has been facing some significant stressors/losses. His father passed away last year. His relationship with his wife has been strained and they have been discussing separation. He has been  feeling depressed and easily moved to tears for several Months, but getting worse. Earlier this week he drank alcohol heavily , blacked out, and states that in the context of blackout apparently took a gun and made threats to hurt self and was very agitated. He states he does not remember episode as was blacked out, but states that he is not suicidal and that " it was the alcohol making me do that". He does present depressed, sad, tearful at times, but is able to contract for safety at this time. He does have a history of Alcohol Abuse in binges, and at least one prior episode of agitation and threats in the context of intoxication several months ago. Last drank several days ago and is not presenting with WDL symptoms at present  Dx-  MDD, without psychotic symptoms, Alcohol Abuse  Plan- admit to inpatient psychiatric unit, continue Wellbutrin XL 450 mgrs QDAY, decrease Adderall dose to 20 mgrs QAM, and Start Zoloft 50 mgrs QDAY

## 2015-04-14 NOTE — BHH Counselor (Signed)
Adult Comprehensive Assessment  Patient ID: Russell Peters, male   DOB: 09-26-1966, 49 y.o.   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, learning disability Family Relationships: Marital issues for 8 months, going through a separation, not emotionally close with brothers Museum/gallery curator / Lack of resources (include bankruptcy): N/A Housing / Lack of housing: N/A Physical health (include injuries & life threatening diseases): high blood pressure Social relationships: Lacks strong support system Substance abuse: Daily drinking prior to 8 months ago; excessive ETOH use occasionally Bereavement / Loss: Father died in 31-Jul-2014; loss of marriage  Living/Environment/Situation:  Living Arrangements: Spouse/significant other Living conditions (as described by patient or guardian): Lives with wife in [place] How long has patient lived in current situation?: years What is atmosphere in current home: Comfortable  Family History:  Marital status: Married Number of Years Married: 49 What types of issues is patient dealing with in the relationship?: Marital issues due to wife's infidelity; probable separation Does patient have children?: Yes How many children?: 1 How is patient's relationship with their children?: close with step-daughter  Childhood History:  By whom was/is the patient raised?: Both parents Description of patient's relationship with caregiver when they were a child: good relationship with both parents who were described as religious Patient's description of current relationship with people who raised him/her: Father died in July 205, patient has been distant from his mother since that time Does patient have siblings?: Yes Number of Siblings: 3 Description of patient's current relationship with siblings: Good relationship with 3 siblings but reports that they are not  emotional supports Did patient suffer any verbal/emotional/physical/sexual abuse as a child?: No Did patient suffer from severe childhood neglect?: No Has patient ever been sexually abused/assaulted/raped as an adolescent or adult?: No Was the patient ever a victim of a crime or a disaster?: No Witnessed domestic violence?: No Has patient been effected by domestic violence as an adult?: No  Education:  Highest grade 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 Currently a student?: No Learning disability?: Yes What learning problems does patient have?: Diagnosed with ADHD at age 59 and takes adderall  Employment/Work Situation:   Employment situation: Employed 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 How long has patient been employed?: Since 19 Patient's job has been impacted by current illness: No What is the longest time patient has a held a job?: Since age of 68 Where was the patient employed at that time?: Funeral home Has patient ever been in the [employer]?: No Has patient ever served in combat?: No  Financial Resources:   Financial resources: Income from employment Does patient have a representative payee or guardian?: No  Alcohol/Substance Abuse:   What has been your use of drugs/alcohol within the last 12 months?: Daily drinking prior to 8 months ago; excessive ETOH use occasionally If attempted suicide, did drugs/alcohol play a role in this?: Yes (Intoxicated while attempting to shoot himself) Alcohol/Substance Abuse Treatment Hx: Denies past history Has alcohol/substance abuse ever caused legal problems?: No  Social Support System:   Pensions consultant Support System: Poor Describe Community Support System: Step-daughter Type of faith/religion: Christian How does patient's faith help to cope with current illness?: "I feel like it has let me down"  Leisure/Recreation:   Leisure and Hobbies: camping, fishing, mountain biking,  hiking, reading  Strengths/Needs:   What things does the patient do well?: mechanical skills,  interpersonal skills, cares about family In what areas does patient struggle / problems for patient: self-esteem and decision making   Discharge Plan:   Does patient have access to transportation?: Yes Will patient be returning to same living situation after discharge?: Yes Currently receiving community mental health services: Yes (From Whom) (therapist Vanetta Mulders and [name]) If no, would patient like referral for services when discharged?: Yes (What county?) (medication management in [place]) Does patient have financial barriers related to discharge medications?: No  Summary/Recommendations:     Patient is a 49 year old Caucasian Male admitted for SI and depression. Patient lives in [place] with his wife. Primary stressor is probable separation with wife. Patient plans to return home to follow up with outpatient services. He would like information on other medication management services in [place]. Patient will benefit from crisis stabilization, medication evaluation, group therapy, and psycho education in addition to case management for discharge planning. Patient and CSW reviewed pt's identified goals and treatment plan. Pt verbalized understanding and agreed to treatment plan.   Russell Peters, Russell Needle 04/14/2015

## 2015-04-14 NOTE — Progress Notes (Signed)
NUTRITION ASSESSMENT  Pt identified as at risk on the Malnutrition Screen Tool  INTERVENTION: 1. Educated patient on the importance of nutrition and encouraged intake of food and beverages. 2. Discussed weight goals. 3. Supplements: Unjury protein powder BID  NUTRITION DIAGNOSIS: Unintentional weight loss related to sub-optimal intake as evidenced by pt report.   Goal: Pt to meet >/= 90% of their estimated nutrition needs.  Monitor:  PO intake  Assessment:  Pt reports poor appetite and unintentional 15 lb wt loss. His usual body weight is ~268 lbs. Pt has been drinking Curator Breakfast supplements at home. Will continue protein supplements while at [facility]. Pt reports that he does not really feel hungry for lunch, but he did report eating two pudding cups for snack prior to lunch.   Labs and medications reviewed  49 y.o. male  Height: Ht Readings from Last 1 Encounters:  04/14/15 5' 10.5" (1.791 m)    Weight: Wt Readings from Last 1 Encounters:  04/14/15 253 lb (114.76 kg)    Weight Hx: Wt Readings from Last 10 Encounters:  04/14/15 253 lb (114.76 kg)  08/14/14 268 lb (121.564 kg)  03/07/14 274 lb (124.286 kg)  11/30/13 264 lb (119.75 kg)  11/22/13 264 lb (119.75 kg)  07/21/13 268 lb (121.564 kg)  10/20/12 270 lb (122.471 kg)  03/24/12 260 lb (117.935 kg)  06/24/11 278 lb (126.1 kg)  05/16/11 281 lb (127.461 kg)    BMI:  Body mass index is 35.78 kg/(m^2). Pt meets criteria for obesity based on current BMI.  Estimated Nutritional Needs: Kcal: 25-30 kcal/kg Protein: > 1 gram protein/kg Fluid: 1 ml/kcal  Diet Order: Diet regular Room service appropriate?: Yes; Fluid consistency:: Thin Pt is also offered choice of unit snacks mid-morning and mid-afternoon.  Pt is eating as desired.   Lab results and medications reviewed.   Laurette Schimke [place], [place], [place]

## 2015-04-14 NOTE — Progress Notes (Signed)
Pt stated that today was better than yesterday even though he appears to be flat. He stated that he found the social workers group to be very beneficial to him today.

## 2015-04-14 NOTE — Clinical Social Work Note (Signed)
CSW left voicemail for patient's wife Butch Penny [phone] & therapist Vanetta Mulders to schedule aftercare. Awaiting return calls.  Tilden Fossa, MSW, [place] Worker [facility] [phone]

## 2015-04-14 NOTE — Progress Notes (Signed)
Patient denied SI and HI.  Contracts for safety.  Denied A/V hallucinations.

## 2015-04-15 MED ORDER — POLYETHYLENE GLYCOL 3350 17 G PO PACK
17.0000 g | PACK | Freq: Every day | ORAL | Status: DC
  Administered 2015-04-15 – 2015-04-16 (×2): 17 g via ORAL
  Filled 2015-04-15 (×7): qty 1

## 2015-04-15 MED ORDER — TRAZODONE HCL 100 MG PO TABS
100.0000 mg | ORAL_TABLET | Freq: Every evening | ORAL | Status: DC | PRN
  Filled 2015-04-15 (×2): qty 1

## 2015-04-15 MED ORDER — TRAZODONE HCL 50 MG PO TABS
50.0000 mg | ORAL_TABLET | Freq: Once | ORAL | Status: AC
  Administered 2015-04-15: 50 mg via ORAL
  Filled 2015-04-15: qty 1

## 2015-04-15 MED ORDER — TRAZODONE HCL 100 MG PO TABS
100.0000 mg | ORAL_TABLET | Freq: Every evening | ORAL | Status: DC | PRN
  Administered 2015-04-15 – 2015-04-17 (×3): 100 mg via ORAL
  Filled 2015-04-15: qty 3

## 2015-04-15 NOTE — BHH Group Notes (Signed)
[place] Group Notes:  (Clinical Social Work)  04/15/2015     10-11AM  Summary of Progress/Problems:   The main focus of today's process group was to learn how to use a decisional balance exercise to move forward in the Stages of Change, which were described and discussed.  Motivational Interviewing and a worksheet were utilized to help patients explore in depth the perceived benefits and costs of a self-sabotaging behavior, as well as the  benefits and costs of replacing that with a healthy coping mechanism.   The patient expressed that he reads as one healthy coping skill, and uses ETOH as an unhealthy coping technique.  He also argues with himself in an unhealthy way, was able to explain this to the group, and others identified with him.  At one point he became tearful in talking about losing/giving up a relationship that he had decided was unhealthy, and how difficult that was.  Type of Therapy:  Group Therapy - Process   Participation Level:  Active  Participation Quality:  Attentive, Sharing and Supportive  Affect:  Blunted and Tearful  Cognitive:  Appropriate and Oriented  Insight:  Engaged  Engagement in Therapy:  Engaged  Modes of Intervention:  Education, Motivational Interviewing  Selmer Dominion, LCSW 04/15/2015, 12:36 PM

## 2015-04-15 NOTE — Progress Notes (Signed)
Patient ID: Russell Peters, male   DOB: Apr 01, 1966, 49 y.o.   MRN: [id number]   D: Pt has been very flat and depressed on the unit today, however patient reported that he was feeling much better today. Pt attended all groups and engaged in treatment, pt reported that his goal was to work on Radiographer, therapeutic. Pt reported on his self inventory sheet that his depression was a 3, his hopelessness was a 3, and his anxiety was a 3. Pt reported being negative SI/HI, no AH/VH noted. A: 15 min checks continued for patient safety. R: Pt safety maintained.

## 2015-04-15 NOTE — Progress Notes (Signed)
D: Pt has depressed affect.  He describes his mood as "fair."  Pt reports his goal today was "to get through today."  Pt denies SI/HI, denies hallucinations, denies pain.  Pt reports he had a "hard time sleeping last night."  Pt has been visible in milieu. Pt attended evening group.   A: Introduced self to pt.  Met with pt 1:1 and provided support and encouragement.  Actively listened to pt.  PRN medication administered for sleep. R: Pt is compliant with medications.  Pt verbally contracts for safety.  Will continue to monitor and assess.

## 2015-04-15 NOTE — Plan of Care (Signed)
Problem: Alteration in mood Goal: LTG-Patient reports reduction in suicidal thoughts (Patient reports reduction in suicidal thoughts and is able to verbalize a safety plan for whenever patient is feeling suicidal)  Outcome: Progressing Pt denied SI this shift and verbally contracted for safety.   Problem: Diagnosis: Increased Risk For Suicide Attempt Goal: STG-Patient Will Attend All Groups On The Unit Outcome: Progressing Pt attended evening group on 04/14/15.

## 2015-04-15 NOTE — BHH Group Notes (Signed)
[facility] Group Notes:  (Nursing/MHT/Case Management/Adjunct)  Date:  04/15/2015  Time:  4:28 PM  Type of Therapy:  Psychoeducational Skills  Participation Level:  Active  Participation Quality:  Appropriate  Affect:  Appropriate  Cognitive:  Appropriate  Insight:  Appropriate  Engagement in Group:  Engaged  Modes of Intervention:  Discussion  Summary of Progress/Problems: Pt did attend self inventory group, pt reported that he was negative SI/HI, no AH/VH noted. Pt rated his depression as a 3, and his helplessness/hopelessness as a 3.     Pt reported concerns about not being able to sleep last night and not having a bowel movement, pt advised that the doctor will be made aware. Mary NP made aware new orders noted.   Benancio Deeds [place] 04/15/2015, 4:28 PM

## 2015-04-15 NOTE — Progress Notes (Signed)
Adult Psychoeducational Group Note  Date:  04/15/2015 Time:  9:27 PM  Group Topic/Focus:  Wrap-Up Group:   The focus of this group is to help patients review their daily goal of treatment and discuss progress on daily workbooks.  Participation Level:  Active  Participation Quality:  Attentive  Affect:  Appropriate  Cognitive:  Appropriate  Insight: Appropriate  Engagement in Group:  Engaged  Modes of Intervention:  Discussion  Additional Comments:  Pt stated his goal for today was to learn new coping skills. Pt stated he wants to learn how to "fly fish." He bought all of the equipment once, on an impulse, but never actually used it.  Clint Bolder 04/15/2015, 9:27 PM

## 2015-04-15 NOTE — Progress Notes (Signed)
[facility] MD Progress Note  04/15/2015 2:59 PM Russell Peters  MRN:  [id number]     Subjective:   Speaks to his recent separation from his wife of 37 years, father died one year ago and his "mental break" two weeks ago.  However today he is hopeful and grateful and "sees light".  "I feel so much better today"   Denies SIHI.  Has been attending groups   Principal Problem: Major depressive disorder, recurrent episode, severe Diagnosis:   Patient Active Problem List   Diagnosis Date Noted  . Alcohol abuse [F10.10]   . Major depressive disorder, recurrent episode, severe [F33.2] 04/13/2015  . Alcohol dependence with intoxication, uncomplicated [J03.009] 23/30/0762  . Substance induced mood disorder [F19.94] 04/04/2015  . Hypothyroid [E03.9] 05/16/2011  . Hypertension [I10] 05/16/2011  . Obesity [E66.9] 05/16/2011   Total Time spent with patient: 30 minutes   Past Medical History:  Past Medical History  Diagnosis Date  . Allergy   . ADD (attention deficit disorder)   . Hypertension   . GERD (gastroesophageal reflux disease)   . Thyroid disease   . Sleep apnea   . Depression     Past Surgical History  Procedure Laterality Date  . No past surgeries     Family History: History reviewed. No pertinent family history. Social History:  History  Alcohol Use  . Yes    Comment: 3 beers weekly in past 8 months; 1 pt weekly liquor     History  Drug Use No    History   Social History  . Marital Status: Married    Spouse Name: N/A  . Number of Children: N/A  . Years of Education: N/A   Social History Main Topics  . Smoking status: Former Smoker    Quit date: 12/23/1998  . Smokeless tobacco: Never Used  . Alcohol Use: Yes     Comment: 3 beers weekly in past 8 months; 1 pt weekly liquor  . Drug Use: No  . Sexual Activity: No   Other Topics Concern  . None   Social History Narrative   Additional History:    Sleep: Poor  Appetite:  Fair   Assessment:    Musculoskeletal: Strength & Muscle Tone: within normal limits Gait & Station: normal Patient leans: N/A   Psychiatric Specialty Exam: Physical Exam  Constitutional: He is oriented to person, place, and time. He appears well-developed and well-nourished.  HENT:  Head: Normocephalic and atraumatic.  Neck: Normal range of motion. Neck supple.  Musculoskeletal: Normal range of motion.  Neurological: He is alert and oriented to person, place, and time.  Skin: Skin is warm and dry.    ROS  Blood pressure 102/68, pulse 100, temperature 98.3 F (36.8 C), temperature source Oral, resp. rate 16, height 5' 10.5" (1.791 m), weight 114.76 kg (253 lb).Body mass index is 35.78 kg/(m^2).  General Appearance: Casual  Eye Contact::  Good  Speech:  Clear and Coherent  Volume:  Normal  Mood:  Euphoric  Affect:  Congruent  Thought Process:  Goal Directed  Orientation:  Full (Time, Place, and Person)  Thought Content:  Negative  Suicidal Thoughts:  No  Homicidal Thoughts:  No  Memory:  Immediate;   Good Recent;   Good Remote;   Good  Judgement:  Fair  Insight:  Good  Psychomotor Activity:  Negative  Concentration:  Good  Recall:  Good  Fund of Knowledge:Good  Language: Good  Akathisia:  Negative  Handed:  Right  AIMS (  if indicated):     Assets:  Communication Skills Desire for Improvement Financial Resources/Insurance [place]  ADL's:  Intact  Cognition: WNL  Sleep:  Number of Hours: 4.25     Current Medications: Current Facility-Administered Medications  Medication Dose Route Frequency Provider Last Rate Last Dose  . acetaminophen (TYLENOL) tablet 650 mg  650 mg Oral Q6H PRN Harriet Butte, NP      . alum & mag hydroxide-simeth (MAALOX/MYLANTA) 200-200-20 MG/5ML suspension 30 mL  30 mL Oral Q4H PRN Harriet Butte, NP      . amphetamine-dextroamphetamine (ADDERALL XR) 24 hr capsule 15 mg  15 mg Oral Daily Jenne Campus, MD   15 mg at 04/15/15 0726  . buPROPion (WELLBUTRIN XL) 24 hr tablet 450 mg  450 mg Oral Daily Jenne Campus, MD   450 mg at 04/15/15 0726  . lisinopril (PRINIVIL,ZESTRIL) tablet 10 mg  10 mg Oral Daily Jenne Campus, MD   10 mg at 04/15/15 7829   And  . hydrochlorothiazide (MICROZIDE) capsule 12.5 mg  12.5 mg Oral Daily Jenne Campus, MD   12.5 mg at 04/15/15 0726  . levothyroxine (SYNTHROID, LEVOTHROID) tablet 88 mcg  88 mcg Oral Daily Harriet Butte, NP   88 mcg at 04/15/15 [phone]  . magnesium hydroxide (MILK OF MAGNESIA) suspension 30 mL  30 mL Oral Daily PRN Harriet Butte, NP   30 mL at 04/15/15 1442  . pantoprazole (PROTONIX) EC tablet 40 mg  40 mg Oral Daily Harriet Butte, NP   40 mg at 04/15/15 0726  . polyethylene glycol (MIRALAX / GLYCOLAX) packet 17 g  17 g Oral Daily Knox Royalty, NP      . protein supplement (UNJURY CHOCOLATE CLASSIC) powder 2 oz  2 oz Oral BID BM Dorann Ou, RD   2 oz at 04/14/15 1527  . sertraline (ZOLOFT) tablet 50 mg  50 mg Oral Daily Jenne Campus, MD   50 mg at 04/15/15 0726  . traZODone (DESYREL) tablet 100 mg  100 mg Oral QHS PRN Harriet Butte, NP      . traZODone (DESYREL) tablet 100 mg  100 mg Oral QHS PRN Knox Royalty, NP        Lab Results: No results found for this or any previous visit (from the past 48 hour(s)).  Physical Findings: AIMS: Facial and Oral Movements Muscles of Facial Expression: None, normal Lips and Perioral Area: None, normal Jaw: None, normal Tongue: None, normal,Extremity Movements Upper (arms, wrists, hands, fingers): None, normal Lower (legs, knees, ankles, toes): None, normal, Trunk Movements Neck, shoulders, hips: None, normal, Overall Severity Severity of abnormal movements (highest score from questions above): None, normal Incapacitation due to abnormal movements: None, normal Patient's awareness of abnormal movements (rate only patient's report): No Awareness, Dental Status Current  problems with teeth and/or dentures?: No Does patient usually wear dentures?: No  CIWA:  CIWA-Ar Total: 0 COWS:     Treatment Plan Summary:  Review of chart, vital signs, medications and notes Daily contact with the patient to assess and evaluate synmptoms and progress in treatment  1. Continue crisis management and stabilization. Estimated length of stay 5-7 days  2.  Medication management to reduce current symptoms to base line and improve patient's overall level of functioning  Medications reviewed with the apteint and no untoward effects -increase Trazodone to 100 mg q hs prn X1 Individual and group therapy encouraged  and he has been attending Coping skills for depression, substance abuse, and anxiety  3.  Treat health problems as indicated. 4.  Develop treatment plan to decrease risk of relapse upon discharge and the need for readmission -hopeful for continued counseling and OP treatmetns 5.  Psych-social education regarding relapse prevention and self care. 6.  Health care follow up as needed for medical problems 7.  Continue home medications where appropriate 8.  Disposition in progress    Medical Decision Making:  Established Problem, Stable/Improving (1), Review of Psycho-Social Stressors (1) and Review of Medication Regimen & Side Effects (2)   [place], [place] 04/15/2015, 2:59 PM I agree with assessment and plan Geralyn Flash A. Sabra Heck, M.D.

## 2015-04-16 NOTE — Progress Notes (Signed)
[facility] MD Progress Note  04/16/2015 9:47 AM Russell Peters  MRN:  [id number]     Subjective:    "I feel so much better today, today is even  better than yesterday"    Has been attending groups, reports hospital stay is "really helping" him.  Depression 0/10  Anxiety  0/10  Denies SIHI  Expresses hope and gratitude for the future   Principal Problem: Major depressive disorder, recurrent episode, severe Diagnosis:   Patient Active Problem List   Diagnosis Date Noted  . Alcohol abuse [F10.10]   . Major depressive disorder, recurrent episode, severe [F33.2] 04/13/2015  . Alcohol dependence with intoxication, uncomplicated [G38.756] 43/32/9518  . Substance induced mood disorder [F19.94] 04/04/2015  . Hypothyroid [E03.9] 05/16/2011  . Hypertension [I10] 05/16/2011  . Obesity [E66.9] 05/16/2011   Total Time spent with patient: 30 minutes   Past Medical History:  Past Medical History  Diagnosis Date  . Allergy   . ADD (attention deficit disorder)   . Hypertension   . GERD (gastroesophageal reflux disease)   . Thyroid disease   . Sleep apnea   . Depression     Past Surgical History  Procedure Laterality Date  . No past surgeries     Family History: History reviewed. No pertinent family history. Social History:  History  Alcohol Use  . Yes    Comment: 3 beers weekly in past 8 months; 1 pt weekly liquor     History  Drug Use No    History   Social History  . Marital Status: Married    Spouse Name: N/A  . Number of Children: N/A  . Years of Education: N/A   Social History Main Topics  . Smoking status: Former Smoker    Quit date: 12/23/1998  . Smokeless tobacco: Never Used  . Alcohol Use: Yes     Comment: 3 beers weekly in past 8 months; 1 pt weekly liquor  . Drug Use: No  . Sexual Activity: No   Other Topics Concern  . None   Social History Narrative   Additional History:    Sleep: Good  Appetite:  Good   Assessment:   Musculoskeletal: Strength  & Muscle Tone: within normal limits Gait & Station: normal Patient leans: N/A   Psychiatric Specialty Exam: Physical Exam  Constitutional: He is oriented to person, place, and time. He appears well-developed and well-nourished.  HENT:  Head: Normocephalic and atraumatic.  Neck: Normal range of motion. Neck supple.  Musculoskeletal: Normal range of motion.  Neurological: He is alert and oriented to person, place, and time.  Skin: Skin is warm and dry.    ROS  Blood pressure 105/68, pulse 88, temperature 97.3 F (36.3 C), temperature source Oral, resp. rate 18, height 5' 10.5" (1.791 m), weight 114.76 kg (253 lb).Body mass index is 35.78 kg/(m^2).  General Appearance: Casual  Eye Contact::  Good  Speech:  Clear and Coherent  Volume:  Normal  Mood:  Euphoric  Affect:  Congruent  Thought Process:  Goal Directed  Orientation:  Full (Time, Place, and Person)  Thought Content:  Negative  Suicidal Thoughts:  No  Homicidal Thoughts:  No  Memory:  Immediate;   Good Recent;   Good Remote;   Good  Judgement:  Fair  Insight:  Good  Psychomotor Activity:  Negative  Concentration:  Good  Recall:  Good  Fund of Knowledge:Good  Language: Good  Akathisia:  Negative  Handed:  Right  AIMS (if indicated):  Assets:  Communication Skills Desire for Improvement Financial Resources/Insurance Housing Physical Health Social Support Vocational/Educational  ADL's:  Intact  Cognition: WNL  Sleep:  Number of Hours: 6.25     Current Medications: Current Facility-Administered Medications  Medication Dose Route Frequency Provider Last Rate Last Dose  . acetaminophen (TYLENOL) tablet 650 mg  650 mg Oral Q6H PRN Harriet Butte, NP      . alum & mag hydroxide-simeth (MAALOX/MYLANTA) 200-200-20 MG/5ML suspension 30 mL  30 mL Oral Q4H PRN Harriet Butte, NP      . amphetamine-dextroamphetamine (ADDERALL XR) 24 hr capsule 15 mg  15 mg Oral Daily Jenne Campus, MD   15 mg at 04/16/15 0731   . buPROPion (WELLBUTRIN XL) 24 hr tablet 450 mg  450 mg Oral Daily Jenne Campus, MD   450 mg at 04/16/15 0731  . lisinopril (PRINIVIL,ZESTRIL) tablet 10 mg  10 mg Oral Daily Jenne Campus, MD   10 mg at 04/16/15 0731   And  . hydrochlorothiazide (MICROZIDE) capsule 12.5 mg  12.5 mg Oral Daily Myer Peer Cobos, MD   12.5 mg at 04/16/15 0731  . levothyroxine (SYNTHROID, LEVOTHROID) tablet 88 mcg  88 mcg Oral Daily Harriet Butte, NP   88 mcg at 04/16/15 0629  . magnesium hydroxide (MILK OF MAGNESIA) suspension 30 mL  30 mL Oral Daily PRN Harriet Butte, NP   30 mL at 04/15/15 1442  . pantoprazole (PROTONIX) EC tablet 40 mg  40 mg Oral Daily Harriet Butte, NP   40 mg at 04/16/15 0731  . polyethylene glycol (MIRALAX / GLYCOLAX) packet 17 g  17 g Oral Daily Knox Royalty, NP   17 g at 04/16/15 0731  . protein supplement (UNJURY CHOCOLATE CLASSIC) powder 2 oz  2 oz Oral BID BM Dorann Ou, RD   2 oz at 04/14/15 1527  . sertraline (ZOLOFT) tablet 50 mg  50 mg Oral Daily Jenne Campus, MD   50 mg at 04/16/15 0731  . traZODone (DESYREL) tablet 100 mg  100 mg Oral QHS PRN Harriet Butte, NP      . traZODone (DESYREL) tablet 100 mg  100 mg Oral QHS PRN Knox Royalty, NP   100 mg at 04/15/15 2222    Lab Results: No results found for this or any previous visit (from the past 48 hour(s)).  Physical Findings: AIMS: Facial and Oral Movements Muscles of Facial Expression: None, normal Lips and Perioral Area: None, normal Jaw: None, normal Tongue: None, normal,Extremity Movements Upper (arms, wrists, hands, fingers): None, normal Lower (legs, knees, ankles, toes): None, normal, Trunk Movements Neck, shoulders, hips: None, normal, Overall Severity Severity of abnormal movements (highest score from questions above): None, normal Incapacitation due to abnormal movements: None, normal Patient's awareness of abnormal movements (rate only patient's report): No Awareness, Dental  Status Current problems with teeth and/or dentures?: No Does patient usually wear dentures?: No  CIWA:  CIWA-Ar Total: 0 COWS:     Treatment Plan Summary:  Review of chart, vital signs, medications and notes Daily contact with the patient to assess and evaluate synmptoms and progress in treatment  1. Continue crisis management and stabilization. Estimated length of stay 5-7 days  2.  Medication management to reduce current symptoms to base line and improve patient's overall level of functioning  Medications reviewed with the pateint and no reported  side effects -Trazodone to 100 mg q hs prn/ will get CPAP from home  which will help with sleep also Individual and group therapy encouraged and he has been attending,  "I'm learning coping skills" Coping skills for depression, substance abuse, and anxiety  3.  Treat health problems as indicated. 4.  Develop treatment plan to decrease risk of relapse upon discharge and the need for readmission -hopeful for continued counseling and OP treatmetns 5.  Psych-social education regarding relapse prevention and self care. 6.  Health care follow up as needed for medical problems 7.  Continue home medications where appropriate 8.  Disposition in progress    Medical Decision Making:  Established Problem, Stable/Improving (1), Review of Psycho-Social Stressors (1) and Review of Medication Regimen & Side Effects (2)   LARACH, [place] 04/16/2015, 9:47 AM I agree with assessment and plan Geralyn Flash A. Sabra Heck, M.D.

## 2015-04-16 NOTE — Progress Notes (Signed)
Patient ID: Russell Peters, male   DOB: 1966/11/12, 49 y.o.   MRN: [id number] D)  Has been pleasant and cooperative, and has been interacting appropriately with staff and peers.  Attended group and has been compliant with meds and programming.  Has voiced no c/o's and denies thoughts of self harm. A)  Will continue to monitor for safety, continue POC R)  Safety maintained.

## 2015-04-16 NOTE — BHH Group Notes (Signed)
[name] Group Notes:  (Clinical Social Work)  04/16/2015  10:00-11:00AM  Summary of Progress/Problems:   The main focus of today's process group was to   1)  discuss the importance of adding supports  2)  define health supports versus unhealthy supports  3)  identify the patient's current unhealthy supports and plan how to handle them  4)  Identify the patient's current healthy supports and plan what to add.  An emphasis was placed on using counselor, doctor, therapy groups, 12-step groups, and problem-specific support groups to expand supports.    The patient expressed full comprehension of the concepts presented, and agreed that there is a need to add more supports.  The patient stated his healthy supports include his stepdaughter and 45yo grandson, who both remain neutral and are nonjudgmental, give him the drive and motivation to get better.  He said his wife, brothers, and mother are unhealthy supports.  Even though his brothers make statements of support, he feels that they just want him to get better so that he can get back to work, and they won't have to spend their off days covering for him.    Type of Therapy:  Process Group with Motivational Interviewing  Participation Level:  Active  Participation Quality:  Appropriate, Attentive, Sharing and Supportive  Affect:  Blunted and Depressed  Cognitive:  Alert, Appropriate and Oriented  Insight:  Engaged  Engagement in Therapy:  Engaged  Modes of Intervention:   Education, Support and Processing, Activity  [employer], LCSW 04/16/2015, 12:15pm

## 2015-04-16 NOTE — Progress Notes (Signed)
Report from [facility]

## 2015-04-16 NOTE — Progress Notes (Signed)
Patient ID: Russell Peters, male   DOB: 1966/03/06, 49 y.o.   MRN: [id number]   D: Pt has been appropriate today on the unit. Pt reported that he felt a little better. Pt reported on his self inventory sheet that that his depression was a 0, his hopelessness was a 0, and his anxiety was a 0. Pt reported that his goal for today was to work on coping skills. Pt reported being negative SI/HI, no AH/VH noted. A: 15 min checks continued for patient safety. R: Pt safety maintained.

## 2015-04-17 MED ORDER — AMPHETAMINE-DEXTROAMPHET ER 10 MG PO CP24
10.0000 mg | ORAL_CAPSULE | Freq: Every day | ORAL | Status: DC
  Administered 2015-04-18: 10 mg via ORAL
  Filled 2015-04-17: qty 1

## 2015-04-17 MED ORDER — SERTRALINE HCL 50 MG PO TABS
75.0000 mg | ORAL_TABLET | Freq: Every day | ORAL | Status: DC
  Administered 2015-04-18: 75 mg via ORAL
  Filled 2015-04-17 (×3): qty 1

## 2015-04-17 NOTE — Progress Notes (Signed)
Pt attended wrap-up group stating that his goal for today was to work on getting discharged.  Pt stated that he had a very positive meeting with the MD and counselors today and he is expecting to be d/c'd tomorrow 4.26.16.   Rick Duff, MHT

## 2015-04-17 NOTE — Progress Notes (Addendum)
Patient ID: Russell Peters, male   DOB: [birth year], 49 y.o.   MRN: [id number] [facility] MD Progress Note  04/17/2015 2:59 PM Russell Peters  MRN:  [id number]     Subjective:     Patient states he is feeling better than when he was admitted. He has continued to have some depression, but overall is feeling better, and describes depression today as low.   Objective :  I have discussed case with treatment team and have met with patient. As noted, patient reports improvement compared to admission, although still identifies some residual depression. He is feeling more optimistic and furtere oriented and is planning on returning to work soon afgter discharge. 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 States he does not feel work and dealing with people's deaths and loss contributes significantly to his depression- he does state that sometimes if the deceased is young or a child it will make him feel depressed. We continued to review the importance of alcohol abstinence . At this time denying medications side effects and feels medications are helping. No disruptive behaviors  on the unit and going to groups, visible in day room.      Principal Problem: Major depressive disorder, recurrent episode, severe Diagnosis:   Patient Active Problem List   Diagnosis Date Noted  . Alcohol abuse [F10.10]   . Major depressive disorder, recurrent episode, severe [F33.2] 04/13/2015  . Alcohol dependence with intoxication, uncomplicated [G26.948] 54/62/7035  . Substance induced mood disorder [F19.94] 04/04/2015  . Hypothyroid [E03.9] 05/16/2011  . Hypertension [I10] 05/16/2011  . Obesity [E66.9] 05/16/2011   Total Time spent with patient: 20 minutes   Past Medical History:  Past Medical History  Diagnosis Date  . Allergy   . ADD (attention deficit disorder)   . Hypertension   . GERD (gastroesophageal reflux disease)   . Thyroid disease    . Sleep apnea   . Depression     Past Surgical History  Procedure Laterality Date  . No past surgeries     Family History: History reviewed. No pertinent family history. Social History:  History  Alcohol Use  . Yes    Comment: 3 beers weekly in past 8 months; 1 pt weekly liquor     History  Drug Use No    History   Social History  . Marital Status: Married    Spouse Name: N/A  . Number of Children: N/A  . Years of Education: N/A   Social History Main Topics  . Smoking status: Former Smoker    Quit date: 12/23/1998  . Smokeless tobacco: Never Used  . Alcohol Use: Yes     Comment: 3 beers weekly in past 8 months; 1 pt weekly liquor  . Drug Use: No  . Sexual Activity: No   Other Topics Concern  . None   Social History Narrative   Additional History:    Sleep: Good  Appetite:  Good   Assessment:   Musculoskeletal: Strength & Muscle Tone: within normal limits Gait & Station: normal Patient leans: N/A   Psychiatric Specialty Exam: Physical Exam  Constitutional: He is oriented to person, place, and time. He appears well-developed and well-nourished.  HENT:  Head: Normocephalic and atraumatic.  Neck: Normal range of motion. Neck supple.  Musculoskeletal: Normal range of motion.  Neurological: He is alert and oriented to person, place, and time.  Skin: Skin is warm and dry.  Review of Systems  Constitutional: Negative for fever and chills.  Eyes: Negative.   Respiratory: Negative for cough and shortness of breath.   Cardiovascular: Negative for chest pain.  Gastrointestinal: Negative for vomiting.  Genitourinary: Negative for dysuria, urgency and frequency.  Skin: Negative for rash.  Neurological: Negative for seizures and headaches.  Endo/Heme/Allergies: Negative.   Psychiatric/Behavioral: Positive for depression.    Blood pressure 109/78, pulse 82, temperature 97 F (36.1 C), temperature source Oral, resp. rate 18, height 5' 10.5" (1.791 m),  weight 253 lb (114.76 kg).Body mass index is 35.78 kg/(m^2).  General Appearance: Well Groomed  Engineer, water::  Good  Speech:  Clear and Coherent  Volume:  Normal  Mood:  less depressed, affect more reactive, less constricted   Affect:  Congruent  Thought Process:  Goal Directed  Orientation:  Full (Time, Place, and Person)  Thought Content:   No thought disorder   Suicidal Thoughts:  No- at this time denies any thoughts of hurting self or anyone else   Homicidal Thoughts:  No  Memory:  Immediate;   Good Recent;   Good Remote;   Good  Judgement:  Other:  improving  Insight:  Good  Psychomotor Activity:  Negative  Concentration:  Good  Recall:  Good  Fund of Knowledge:Good  Language: Good  Akathisia:  Negative  Handed:  Right  AIMS (if indicated):     Assets:  Communication Skills Desire for Improvement Financial Resources/Insurance Housing Physical Health Social Support Vocational/Educational  ADL's:  Intact  Cognition: WNL  Sleep:  Number of Hours: 6.75     Current Medications: Current Facility-Administered Medications  Medication Dose Route Frequency Provider Last Rate Last Dose  . acetaminophen (TYLENOL) tablet 650 mg  650 mg Oral Q6H PRN Harriet Butte, NP      . alum & mag hydroxide-simeth (MAALOX/MYLANTA) 200-200-20 MG/5ML suspension 30 mL  30 mL Oral Q4H PRN Harriet Butte, NP      . amphetamine-dextroamphetamine (ADDERALL XR) 24 hr capsule 15 mg  15 mg Oral Daily Jenne Campus, MD   15 mg at 04/17/15 0734  . buPROPion (WELLBUTRIN XL) 24 hr tablet 450 mg  450 mg Oral Daily Jenne Campus, MD   450 mg at 04/17/15 0734  . lisinopril (PRINIVIL,ZESTRIL) tablet 10 mg  10 mg Oral Daily Jenne Campus, MD   10 mg at 04/17/15 6767   And  . hydrochlorothiazide (MICROZIDE) capsule 12.5 mg  12.5 mg Oral Daily Jenne Campus, MD   12.5 mg at 04/17/15 0735  . levothyroxine (SYNTHROID, LEVOTHROID) tablet 88 mcg  88 mcg Oral Daily Harriet Butte, NP   88 mcg at  04/17/15 2094  . magnesium hydroxide (MILK OF MAGNESIA) suspension 30 mL  30 mL Oral Daily PRN Harriet Butte, NP   30 mL at 04/15/15 1442  . pantoprazole (PROTONIX) EC tablet 40 mg  40 mg Oral Daily Harriet Butte, NP   40 mg at 04/17/15 0735  . polyethylene glycol (MIRALAX / GLYCOLAX) packet 17 g  17 g Oral Daily Knox Royalty, NP   17 g at 04/16/15 0731  . protein supplement (UNJURY CHOCOLATE CLASSIC) powder 2 oz  2 oz Oral BID BM Dorann Ou, RD   2 oz at 04/14/15 1527  . sertraline (ZOLOFT) tablet 50 mg  50 mg Oral Daily Jenne Campus, MD   50 mg at 04/17/15 0735  . traZODone (DESYREL) tablet 100 mg  100 mg Oral  QHS PRN Harriet Butte, NP      . traZODone (DESYREL) tablet 100 mg  100 mg Oral QHS PRN Knox Royalty, NP   100 mg at 04/16/15 2205    Lab Results: No results found for this or any previous visit (from the past 48 hour(s)).  Physical Findings: AIMS: Facial and Oral Movements Muscles of Facial Expression: None, normal Lips and Perioral Area: None, normal Jaw: None, normal Tongue: None, normal,Extremity Movements Upper (arms, wrists, hands, fingers): None, normal Lower (legs, knees, ankles, toes): None, normal, Trunk Movements Neck, shoulders, hips: None, normal, Overall Severity Severity of abnormal movements (highest score from questions above): None, normal Incapacitation due to abnormal movements: None, normal Patient's awareness of abnormal movements (rate only patient's report): No Awareness, Dental Status Current problems with teeth and/or dentures?: No Does patient usually wear dentures?: No  CIWA:  CIWA-Ar Total: 0 COWS:      Assessment-  At this time patient is improved compared to admission, and depression seems significantly improved, with a fuller range of affect. Tolerating medications well.   Treatment Plan Summary:  Consider discharge soon as he continues to improve. Continue Wellbutrin XL 450 mgrs QDAY for depression Increase Zoloft to 75  mgrs QDAY for depression Decrease Adderall to 10 mgrs  QDAY -  Encouraged ongoing gradual taper due to abuse potential and as patient on Wellbutrin XL.      Medical Decision Making:  Established Problem, Stable/Improving (1), Review of Psycho-Social Stressors (1), Review or order clinical lab tests (1), Review of Last Therapy Session (1) and Review of Medication Regimen & Side Effects (2)   Lawanda Holzheimer  04/17/2015, 2:59 PM

## 2015-04-17 NOTE — Plan of Care (Signed)
Problem: Alteration in mood Goal: LTG-Patient reports reduction in suicidal thoughts (Patient reports reduction in suicidal thoughts and is able to verbalize a safety plan for whenever patient is feeling suicidal)  Outcome: Progressing Patient denies having any suicidal thoughts today.  Problem: Diagnosis: Increased Risk For Suicide Attempt Goal: STG-Patient Will Attend All Groups On The Unit Outcome: Progressing Patient is attending unit groups today. Goal: STG-Patient Will Comply With Medication Regime Outcome: Progressing Patient has adhered to medication regimen today with ease. Pt has refused nutritional supplement d/t increased appetite since admission and he reports he is eating much more now than PTA.  Problem: Ineffective individual coping Goal: STG: Patient will remain free from self harm Outcome: Progressing Patient remains free from self harm. 15 minute checks continued per protocol for patient safety.

## 2015-04-17 NOTE — Clinical Social Work Note (Signed)
CSW has left 2 messages for therapist Vanetta Mulders regarding scheduling aftercare services (4/22 & 4/25). Awaiting return call.  Tilden Fossa, MSW, [place] Worker [facility] [phone]

## 2015-04-17 NOTE — BHH Group Notes (Signed)
   [facility] LCSW Aftercare Discharge Planning Group Note  04/17/2015  8:45 AM   Participation Quality: Alert, Appropriate and Oriented  Mood/Affect: Appropriate  Depression Rating: 0  Anxiety Rating: 0  Thoughts of Suicide: Pt denies SI/HI  Will you contract for safety? Yes  Current AVH: Pt denies  Plan for Discharge/Comments: Pt attended discharge planning group and actively participated in group. CSW provided pt with today's workbook. Patient reports feeling "good" today. Patient reports that he would like to return home to follow up with his current outpatient providers- Vanetta Mulders and [name]. CSW provided patient with listing of other medication management providers at his request.   Transportation Means: Pt reports access to transportation  Supports: No supports mentioned at this time  Tilden Fossa, MSW, [place] Worker [facility] [phone]

## 2015-04-17 NOTE — Progress Notes (Signed)
Recreation Therapy Notes  Date: 04.25.2016 Time: 9:30am Location: [address] Dayroom   Group Topic: Stress Management  Goal Area(s) Addresses:  Patient will actively participate in stress management techniques presented during session.   Behavioral Response: Engaged, Appropriate   Intervention: Stress management techniques  Activity :  Deep Breathing and Guided Visualization. LRT provided instruction and demonstration on practice of Guided Visualization. Technique was coupled with deep breathing.   Education:  Stress Management, Discharge Planning.   Education Outcome: Acknowledges education  Clinical Observations/Feedback: Patient actively participated in presenting technique, patient displayed no difficulty and expressed ability to practice post d/c.    Laureen Ochs Annaka Cleaver, LRT/CTRS  Glennie Rodda L 04/17/2015 1:58 PM

## 2015-04-17 NOTE — Progress Notes (Signed)
Pt stated that his goal for today was to come up with realistic coping skills that he could use on a daily basis, stated that he came with a pretty good list so far. Pt stated that he had a good day today and feels like he's on the right track.  Rick Duff, MHT

## 2015-04-17 NOTE — Progress Notes (Signed)
D: Patient is alert and oriented. Pt's mood and affect is pleasant and blunted at times, superficially bright upon interaction. Pt denies SI/HI and AVH. Pt reports he slept well last night d/t using CPAP machine. Pt denies depression and hopelessness and rates anxiety 1/10. Pt refuses nutritional supplements and reports his appetite has increased significantly since admission and reports he ate a large breakfast and lunch today. Pt is attending unit groups. A: Active listening by RN. Encouragement/Support provided to pt. Medication education reviewed with pt. Scheduled medications administered per providers orders (See MAR). 15 minute checks continued per protocol for patient safety.  R: Patient cooperative and receptive to nursing interventions. Pt remains safe.

## 2015-04-17 NOTE — BHH Suicide Risk Assessment (Signed)
[place] INPATIENT:  Family/Significant Other Suicide Prevention Education  Suicide Prevention Education:  Education Completed; wife Tomas Schamp [phone],  (name of family member/significant other) has been identified by the patient as the family member/significant other with whom the patient will be residing, and identified as the person(s) who will aid the patient in the event of a mental health crisis (suicidal ideations/suicide attempt).  With written consent from the patient, the family member/significant other has been provided the following suicide prevention education, prior to the and/or following the discharge of the patient.  The suicide prevention education provided includes the following:  Suicide risk factors  Suicide prevention and interventions  National Suicide Hotline telephone number  [facility] assessment telephone number  [facility] Emergency Assistance [place] and/or Residential Mobile Crisis Unit telephone number  Request made of family/significant other to:  Remove weapons (e.g., guns, rifles, knives), all items previously/currently identified as safety concern.    Remove drugs/medications (over-the-counter, prescriptions, illicit drugs), all items previously/currently identified as a safety concern.  The family member/significant other verbalizes understanding of the suicide prevention education information provided.  The family member/significant other agrees to remove the items of safety concern listed above.  Renisha Cockrum, [name] 04/17/2015, 4:35 PM

## 2015-04-17 NOTE — BHH Group Notes (Signed)
[place] LCSW Group Therapy 04/17/2015  1:15 pm  Type of Therapy: Group Therapy Participation Level: Active  Participation Quality: Attentive, Sharing and Supportive  Affect: Appropriate  Cognitive: Alert and Oriented  Insight: Developing/Improving and Engaged  Engagement in Therapy: Developing/Improving and Engaged  Modes of Intervention: Clarification, Confrontation, Discussion, Education, Exploration,  Limit-setting, Orientation, Problem-solving, Rapport Building, Art therapist, Socialization and Support  Summary of Progress/Problems: Pt identified obstacles faced currently and processed barriers involved in overcoming these obstacles. Pt identified steps necessary for overcoming these obstacles and explored motivation (internal and external) for facing these difficulties head on. Pt further identified one area of concern in their lives and chose a goal to focus on for today. Patient identified his relationship with his wife and potential separation as an obstacle. Patient reports that he is able to focus on his meaningful relationships with his step-daughter and grandson in his recovery and identified these relationships as a motivator for recovery. CSW and other group members provided patient with emotional support and encouragement.   Tilden Fossa, MSW, [place] Worker [facility] [phone]

## 2015-04-18 DIAGNOSIS — F325 Major depressive disorder, single episode, in full remission: Secondary | ICD-10-CM | POA: Insufficient documentation

## 2015-04-18 MED ORDER — AMPHETAMINE-DEXTROAMPHET ER 10 MG PO CP24: 10.0000 mg | ORAL_CAPSULE | Freq: Every day | ORAL | Status: DC

## 2015-04-18 MED ORDER — LISINOPRIL-HYDROCHLOROTHIAZIDE 10-12.5 MG PO TABS: 1.0000 | ORAL_TABLET | Freq: Every day | ORAL | Status: DC

## 2015-04-18 MED ORDER — BUPROPION HCL ER (XL) 150 MG PO TB24
450.0000 mg | ORAL_TABLET | Freq: Every day | ORAL | Status: DC
  Filled 2015-04-18: qty 3

## 2015-04-18 MED ORDER — TRAZODONE HCL 100 MG PO TABS: 100.0000 mg | ORAL_TABLET | Freq: Every evening | ORAL | Status: DC | PRN

## 2015-04-18 MED ORDER — BUPROPION HCL ER (XL) 450 MG PO TB24: 450.0000 mg | ORAL_TABLET | Freq: Every day | ORAL | Status: DC

## 2015-04-18 MED ORDER — SERTRALINE HCL 25 MG PO TABS: 75.0000 mg | ORAL_TABLET | Freq: Every day | ORAL | Status: DC

## 2015-04-18 MED ORDER — SYNTHROID 88 MCG PO TABS: 88.0000 ug | ORAL_TABLET | Freq: Every day | ORAL | Status: DC

## 2015-04-18 MED ORDER — SERTRALINE HCL 25 MG PO TABS
75.0000 mg | ORAL_TABLET | Freq: Every day | ORAL | Status: DC
  Filled 2015-04-18: qty 3

## 2015-04-18 MED ORDER — OMEPRAZOLE 20 MG PO CPDR: 20.0000 mg | DELAYED_RELEASE_CAPSULE | Freq: Every day | ORAL | Status: AC

## 2015-04-18 NOTE — Progress Notes (Signed)
D/C instructions/meds/follow-up appointments reviewed, pt verbalized understanding, pt's belongings returned to pt, samples given, denies SI/HI/AVH.

## 2015-04-18 NOTE — BHH Group Notes (Signed)
Adult Psychoeducational Group Note  Date:  04/18/2015 Time:  0920am  Group Topic/Focus:  Goals Group:   The focus of this group is to help patients establish daily goals to achieve during treatment and discuss how the patient can incorporate goal setting into their daily lives to aide in recovery. Orientation:   The focus of this group is to educate the patient on the purpose and policies of crisis stabilization and provide a format to answer questions about their admission.  The group details unit policies and expectations of patients while admitted.  Participation Level:  Active  Participation Quality:  Appropriate and Attentive  Affect:  Appropriate  Cognitive:  Alert and Appropriate  Insight: Appropriate and Good  Engagement in Group:  Engaged  Modes of Intervention:  Discussion, Education and Orientation  Additional Comments:  Pt came in to group a few minutes late but was engaged once he arrived, pt shared that his goal for today is to talk with the doctor about discharge and pt states that he has already achieved his goal for today, he also shared some of his positive coping skills with the group such as listening to music, walking and mountain biking.  Wynn Banker 04/18/2015, 10:30 AM

## 2015-04-18 NOTE — Progress Notes (Signed)
Pt attended spiritual care group on grief and loss facilitated by chaplain Jerene Pitch and counseling intern [place] Carmalita Wakefield. Group opened with brief discussion and psycho-social ed around grief and loss in relationships and in relation to self - identifying life patterns, circumstances, changes that cause losses. Established group norm of speaking from own life experience. Group goal of establishing open and affirming space for members to share loss and experience with grief, normalize grief experience and provide psycho social education and grief support.  Group drew on narrative and [place]erian therapeutic modalities.   Russell Peters was present throughout group, and spoke about recent separation from wife. Russell Peters is struggling because she is still living in the same residence, and wife will often express interest in getting back together but then change her mind. She has not spoken or visited [facility] in [facility] [place], and Russell Peters said "maybe we are separated" after sharing this with group. Resonated with grief dovetailing into other areas of life, specifically with substance abuse. In talking about finding places of peace in grief, Russell Peters identified with this being hard to do when feeling overwhelmed as he has felt for awhile.   [place] Counseling Intern

## 2015-04-18 NOTE — BHH Suicide Risk Assessment (Signed)
[facility] Discharge Suicide Risk Assessment   Demographic Factors:  49 year old married man, lives with wife, employed   Total Time spent with patient: 30 minutes  Musculoskeletal: Strength & Muscle Tone: within normal limits Gait & Station: normal Patient leans: N/A  Psychiatric Specialty Exam: Physical Exam  ROS  Blood pressure 116/77, pulse 82, temperature 98 F (36.7 C), temperature source Oral, resp. rate 18, height 5' 10.5" (1.791 m), weight 253 lb (114.76 kg).Body mass index is 35.78 kg/(m^2).  General Appearance: Well Groomed  Eye Contact::  Good  Speech:  Normal Rate409  Volume:  Normal  Mood:  improved, currently euthymic  Affect:  Appropriate and more reactive   Thought Process:  Goal Directed and Linear  Orientation:  Full (Time, Place, and Person)  Thought Content:  denies any hallucinations, no delusions, not internally preoccupied   Suicidal Thoughts:  No  Homicidal Thoughts:  No  Memory:  recent and remote grossly intact   Judgement:  Good  Insight:  Present  Psychomotor Activity:  Normal  Concentration:  Good  Recall:  Good  Fund of Knowledge:Good  Language: Good  Akathisia:  Negative  Handed:  Right  AIMS (if indicated):     Assets:  Communication Skills Desire for Improvement Housing Social Support Vocational/Educational  Sleep:  Number of Hours: 6.75  Cognition: WNL  ADL's: improved    Have you used any form of tobacco in the last 30 days? (Cigarettes, Smokeless Tobacco, Cigars, and/or Pipes): No  Has this patient used any form of tobacco in the last 30 days? (Cigarettes, Smokeless Tobacco, Cigars, and/or Pipes) No  Mental Status Per Nursing Assessment::   On Admission:  NA  Current Mental Status by Physician: At this time patient is much improved, and presents well groomed, euthymic, with full range of affect, no thought disorder, no SI, no HI, no psychotic symptoms, future oriented.   Loss Factors: Marital difficulties, potential separation,  alcohol abuse   Historical Factors: History of Depression, History of Alcohol Abuse   Risk Reduction Factors:   Sense of responsibility to family, Employed and Positive social support  Continued Clinical Symptoms:  At this time he is improved compared to admission. Mood and affect improved . No SI, no HI, future oriented.  Cognitive Features That Contribute To Risk:  No gross cognitive deficits noted upon discharge. Is alert , attentive, and oriented x 3    Suicide Risk:  Mild:  Suicidal ideation of limited frequency, intensity, duration, and specificity.  There are no identifiable plans, no associated intent, mild dysphoria and related symptoms, good self-control (both objective and subjective assessment), few other risk factors, and identifiable protective factors, including available and accessible social support.  Principal Problem: Major depressive disorder, recurrent episode, severe Discharge Diagnoses:  Patient Active Problem List   Diagnosis Date Noted  . Alcohol abuse [F10.10]   . Major depressive disorder, recurrent episode, severe [F33.2] 04/13/2015  . Alcohol dependence with intoxication, uncomplicated [Q11.941] 74/07/1447  . Substance induced mood disorder [F19.94] 04/04/2015  . Hypothyroid [E03.9] 05/16/2011  . Hypertension [I10] 05/16/2011  . Obesity [E66.9] 05/16/2011    Follow-up Information    Follow up with Vanetta Mulders, LCSW.   Why:  Needs therapy appt prior to d/c   Contact information:   [place] [address] Tel [phone]       Follow up with [facility] On 04/25/2015.   Why:  Medication management appointment on Tuesday May 3rd at 3:30pm. Please call office  if you need to reschedule.   Contact information:   [address]  [place], [place] [address] Phone [phone]           Fax [phone]      Plan Of Care/Follow-up recommendations:  Activity:  as tolerated Diet:  heart healthy Tests:   NA Other:  See below  Is patient on multiple antipsychotic therapies at discharge:  No   Has Patient had three or more failed trials of antipsychotic monotherapy by history:  No  Recommended Plan for Multiple Antipsychotic Therapies: NA   At this time patient is leaving unit in good spirits. Plans to return home. Follow up as above. Has an established PCP for medical issues as needed.    Elcie Pelster 04/18/2015, 9:15 AM

## 2015-04-18 NOTE — Progress Notes (Signed)
  [facility] Adult Case Management Discharge Plan :  Will you be returning to the same living situation after discharge:  Yes,  patient plans to return home At discharge, do you have transportation home?: Yes,  patient reports that his brother will provide transportation Do you have the ability to pay for your medications: Yes,  patient will be provided with prescriptions at discharge  Release of information consent forms completed and in the chart;  Patient's signature needed at discharge.  Patient to Follow up at: Follow-up Information    Follow up with [facility] On 04/25/2015.   Why:  Medication management appointment on Tuesday May 3rd at 3:30pm. Please call office if you need to reschedule.   Contact information:   [address]  [place], [place] [address] Phone [phone]           Fax [phone]      Patient denies SI/HI: Yes,  denies    Safety Planning and Suicide Prevention discussed: Yes,  with patient and wife  Have you used any form of tobacco in the last 30 days? (Cigarettes, Smokeless Tobacco, Cigars, and/or Pipes): No  Has patient been referred to the Quitline?: N/A patient is not a smoker  Carmine Youngberg, Erasmo Downer L 04/18/2015, 11:55 AM

## 2015-04-18 NOTE — Progress Notes (Signed)
D    Pt is appropriate and pleasant   He is compliant with treatment and interacts well with others  And is active in groups  A   Verbal support given   Medications administered and effectiveness monitored   Q 15 min checks R   Pt safe at present

## 2015-04-18 NOTE — Clinical Social Work Note (Signed)
CSW left voicemail for Dellia Nims regarding patient's interest in IOP program.  Tilden Fossa, MSW, [place] Worker [facility] [phone]

## 2015-04-18 NOTE — Clinical Social Work Note (Signed)
CSW spoke with patient's therapist Vanetta Mulders [phone] who is recommending IOP program at discharge. CSW will discuss with patient.   Tilden Fossa, MSW, [place] Worker [facility] [phone]

## 2015-04-18 NOTE — Tx Team (Signed)
Interdisciplinary Treatment Plan Update (Adult) Date: 04/18/2015   Time Reviewed: 9:30 AM  Progress in Treatment: Attending groups: Yes Participating in groups: Yes Taking medication as prescribed: Yes Tolerating medication: Yes Family/Significant other contact made: Yes, CSW has spoken with patient's wife.  Patient understands diagnosis: Yes Discussing patient identified problems/goals with staff: Yes Medical problems stabilized or resolved: Yes Denies suicidal/homicidal ideation: Yes Issues/concerns per patient self-inventory: Yes Other:  New problem(s) identified: N/A  Discharge Plan or Barriers:   4/22: Patient plans to return home to follow up with outpatient services.  4/26: Patient plans to return home to follow up with outpatient services. Patient has upcoming appointment with [name] and plans to contact [facility] once he returns home switch providers.  Reason for Continuation of Hospitalization:  Depression Anxiety Medication Stabilization   Comments: N/A  Estimated length of stay: Discharge anticipated for today 4/26  For review of initial/current patient goals, please see plan of care.  Patient is a 50 year old Male admitted for SI and depression.  Patient will benefit from crisis stabilization, medication evaluation, group therapy, and psycho education in addition to case management for discharge planning. Patient and CSW reviewed pt's identified goals and treatment plan. Pt verbalized understanding and agreed to treatment plan.   Attendees: Patient:    Family:    Physician: Dr. Parke Poisson; Dr. Sabra Heck 04/18/2015 9:30 AM  Nursing: Markham Jordan, Chrisandra Carota, RN 04/18/2015 9:30 AM  Clinical Social Worker: Tilden Fossa, [place] 04/18/2015 9:30 AM  Other: Joette Catching, LCSW 04/18/2015 9:30 AM  Other: Lucinda Dell, [place] Sessions Liaison 04/18/2015 9:30 AM  Other: Lars Pinks, Case Manager 04/18/2015 9:30 AM  Other: Ave Filter, NP 04/18/2015 9:30 AM  Other: Maxie Better, LCSWA 04/18/2015 9:30 AM  Other:    Other:          Scribe for Treatment Team:  Tilden Fossa, MSW, [employer] [phone]

## 2015-04-18 NOTE — Discharge Summary (Signed)
Physician Discharge Summary Note  Patient:  Russell Peters is an 49 y.o., male MRN:  [id number] DOB:  [birth year] Patient phone:  [phone] (home)  Patient address:   [place] [address],  Total Time spent with patient: 30 minutes  Date of Admission:  04/13/2015 Date of Discharge: 04/18/15  Reason for Admission:  Mood stabilization treatments  Principal Problem: Major depressive disorder, recurrent episode, severe Discharge Diagnoses: Patient Active Problem List   Diagnosis Date Noted  . Major depressive disorder, recurrent, severe without psychotic features [F33.2]   . Alcohol abuse [F10.10]   . Major depressive disorder, recurrent episode, severe [F33.2] 04/13/2015  . Alcohol dependence with intoxication, uncomplicated [P80.998] 33/82/5053  . Substance induced mood disorder [F19.94] 04/04/2015  . Hypothyroid [E03.9] 05/16/2011  . Hypertension [I10] 05/16/2011  . Obesity [E66.9] 05/16/2011    Musculoskeletal: Strength & Muscle Tone: within normal limits Gait & Station: normal Patient leans: N/A  Psychiatric Specialty Exam: Physical Exam  Psychiatric: He has a normal mood and affect. His speech is normal and behavior is normal. Judgment and thought content normal. Cognition and memory are normal.    Review of Systems  Constitutional: Negative.   HENT: Negative.   Eyes: Negative.   Respiratory: Negative.   Cardiovascular: Negative.   Gastrointestinal: Negative.   Genitourinary: Negative.   Musculoskeletal: Negative.   Skin: Negative.   Neurological: Negative.   Endo/Heme/Allergies: Negative.   Psychiatric/Behavioral: Positive for depression (Stabilized with treatments). Negative for suicidal ideas, hallucinations, memory loss and substance abuse. The patient is not nervous/anxious and does not have insomnia.     Blood pressure 116/77, pulse 82, temperature 98 F (36.7 C), temperature source Oral, resp. rate 18, height 5' 10.5" (1.791 m),  weight 114.76 kg (253 lb).Body mass index is 35.78 kg/(m^2).  See Physician SRA     Past Medical History:  Past Medical History  Diagnosis Date  . Allergy   . ADD (attention deficit disorder)   . Hypertension   . GERD (gastroesophageal reflux disease)   . Thyroid disease   . Sleep apnea   . Depression     Past Surgical History  Procedure Laterality Date  . No past surgeries     Family History: History reviewed. No pertinent family history. Social History:  History  Alcohol Use  . Yes    Comment: 3 beers weekly in past 8 months; 1 pt weekly liquor     History  Drug Use No    History   Social History  . Marital Status: Married    Spouse Name: N/A  . Number of Children: N/A  . Years of Education: N/A   Social History Main Topics  . Smoking status: Former Smoker    Quit date: 12/23/1998  . Smokeless tobacco: Never Used  . Alcohol Use: Yes     Comment: 3 beers weekly in past 8 months; 1 pt weekly liquor  . Drug Use: No  . Sexual Activity: No   Other Topics Concern  . None   Social History Narrative   Risk to Self: Is patient at risk for suicide?: Yes What has been your use of drugs/alcohol within the last 12 months?: Daily drinking prior to 8 months ago; excessive ETOH use occasionally Risk to Others:   Prior Inpatient Therapy:   Prior Outpatient Therapy:    Level of Care:  OP  Hospital Course:    Russell Peters is a 49 year old man who initially presented to [facility] as a  walk in. The patient reported that his therapist had referred him after he reported suicidal thoughts during a session. He endorsed suicidal thoughts to shoot himself or overdose on medications. The patient also had an incident 04/03/15 where he got intoxicated and threatened to kill himself. At that time he was sent to [facility] and placed under IVC but was not admitted to the hospital. Russell Peters reported worsening depression since finding out his wife of 23 years was cheating on him and recently losing  his father to cancer last year in June. Patient stated the following today during his psychiatric assessment "I got really sad a week ago. I could not get control of myself and was crying. I am struggling with my wife. We have decided to separate. She has been having an affair right under my nose. I guess I could not handle the emotions after we talked. I decided to drink some alcohol to numb myself for a while. I don't remember it all. I drove to my step grandson's house and was driven back home. I have been told that I was waving a gun around threatening myself and my wife. I also thought about using a nail-gun to kill myself with. I have had depression most of my life. Sometimes it gets really bad. I see [name] for my medications. I have been on Wellbutrin for many years and do not think it helps anymore. I took myself off Celexa over a few months because of sexual side effects. I use Adderall for my ADHD. I know the alcohol made things worse. I had been trying to cut back because it was causing problems with my wife. I have not been drinking heavy though and am not having withdrawal symptoms." Russell Peters was cooperative and pleasant during his assessment. He denied any symptoms of psychosis. Patient denied periods of true mania reporting that his irrational behaviors all occurred under the influence of alcohol. Patient's mood appeared depressed throughout the assessment. He contracts for safety on the unit.          Russell Peters was admitted to the adult 400 unit. He was evaluated and his symptoms were identified. Medication management was discussed and initiated. His Wellbutrin XL was continued at 450 mg daily for treatment of depression. He was started on Zoloft 50 mg due to severity of depressive symptoms.  He was oriented to the unit and encouraged to participate in unit programming. Medical problems were identified and treated appropriately. He was continued on his medication for Hypertension. Home  medication was restarted as needed.        The patient was evaluated each day by a clinical provider to ascertain the patient's response to treatment.  Improvement was noted by the patient's report of decreasing symptoms, improved sleep and appetite, affect, medication tolerance, behavior, and participation in unit programming. Patient reported a decrease in depressive symptoms. His Zoloft was increased to 75 mg daily to address residual symptoms. He was asked each day to complete a self inventory noting mood, mental status, pain, new symptoms, anxiety and concerns.         He responded well to medication and being in a therapeutic and supportive environment. Positive and appropriate behavior was noted and the patient was motivated for recovery.  The patient worked closely with the treatment team and case manager to develop a discharge plan with appropriate goals. Coping skills, problem solving as well as relaxation therapies were also part of the unit programming. Patient showed insight into his need  to abstain from alcohol. He discussed the importance during assessments with Dr. Parke Poisson. Patient showed insight that much of his erratic behavior occurs when using alcohol. Patient agreed to have his Adderall XR decreased due to the potential for abuse. Also for the potential interaction with Wellbutrin to cause seizures.          By the day of discharge he was in much improved condition than upon admission.  Symptoms were reported as significantly decreased or resolved completely. The patient denied SI/HI and voiced no AVH. He was motivated to continue taking medication with a goal of continued improvement in mental health. Russell Peters was discharged home with a plan to follow up as noted below. The patient was provided with sample medications and prescriptions at time of discharge. He left [facility] in stable condition with all belongings returned to him.   Consults:  psychiatry  Significant Diagnostic Studies:   Chemistry panel, CBC, UDS positive for amphetamines   Discharge Vitals:   Blood pressure 116/77, pulse 82, temperature 98 F (36.7 C), temperature source Oral, resp. rate 18, height 5' 10.5" (1.791 m), weight 114.76 kg (253 lb). Body mass index is 35.78 kg/(m^2). Lab Results:   No results found for this or any previous visit (from the past 72 hour(s)).  Physical Findings: AIMS: Facial and Oral Movements Muscles of Facial Expression: None, normal Lips and Perioral Area: None, normal Jaw: None, normal Tongue: None, normal,Extremity Movements Upper (arms, wrists, hands, fingers): None, normal Lower (legs, knees, ankles, toes): None, normal, Trunk Movements Neck, shoulders, hips: None, normal, Overall Severity Severity of abnormal movements (highest score from questions above): None, normal Incapacitation due to abnormal movements: None, normal Patient's awareness of abnormal movements (rate only patient's report): No Awareness, Dental Status Current problems with teeth and/or dentures?: No Does patient usually wear dentures?: No  CIWA:  CIWA-Ar Total: 0 COWS:      See Psychiatric Specialty Exam and Suicide Risk Assessment completed by Attending Physician prior to discharge.  Discharge destination:  Home  Is patient on multiple antipsychotic therapies at discharge:  No   Has Patient had three or more failed trials of antipsychotic monotherapy by history:  No  Recommended Plan for Multiple Antipsychotic Therapies: NA      Discharge Instructions    Discharge instructions    Complete by:  As directed   Please follow up with your Primary Care Provider as scheduled for further management of chronic medical problems.            Medication List    STOP taking these medications        amphetamine-dextroamphetamine 25 MG 24 hr capsule  Commonly known as:  ADDERALL XR  Replaced by:  amphetamine-dextroamphetamine 10 MG 24 hr capsule     Docosahexaenoic Acid 300 MG Caps      HYDROcodone-acetaminophen 5-325 MG per tablet  Commonly known as:  NORCO/VICODIN     triamcinolone cream 0.5 %  Commonly known as:  KENALOG     vardenafil 20 MG tablet  Commonly known as:  LEVITRA      TAKE these medications      Indication   amphetamine-dextroamphetamine 10 MG 24 hr capsule  Commonly known as:  ADDERALL XR  Take 1 capsule (10 mg total) by mouth daily.   Indication:  Attention Deficit Hyperactivity Disorder     BuPROPion HCl ER (XL) 450 MG Tb24  Take 450 mg by mouth daily.  Start taking on:  04/19/2015   Indication:  Major Depressive Disorder     FIBER PO  Take 1 tablet by mouth daily as needed (constipation).      lisinopril-hydrochlorothiazide 10-12.5 MG per tablet  Commonly known as:  PRINZIDE,ZESTORETIC  Take 1 tablet by mouth daily.   Indication:  High Blood Pressure     omeprazole 20 MG capsule  Commonly known as:  PRILOSEC  Take 1 capsule (20 mg total) by mouth daily.   Indication:  Gastroesophageal Reflux Disease     sertraline 25 MG tablet  Commonly known as:  ZOLOFT  Take 3 tablets (75 mg total) by mouth daily.   Indication:  Major Depressive Disorder     SYNTHROID 88 MCG tablet  Generic drug:  levothyroxine  Take 1 tablet (88 mcg total) by mouth daily.   Indication:  Underactive Thyroid     traZODone 100 MG tablet  Commonly known as:  DESYREL  Take 1 tablet (100 mg total) by mouth at bedtime as needed for sleep.   Indication:  Trouble Sleeping       Follow-up Information    Follow up with [facility] On 04/25/2015.   Why:  Medication management appointment on Tuesday May 3rd at 3:30pm. Please call office if you need to reschedule.   Contact information:   [address]  [place], [place] [address] Phone [phone]           Fax [phone]      Follow-up recommendations:   Activity: as tolerated Diet: heart healthy Tests: NA Other: See below  Comments:   Take all your medications as prescribed  by your mental healthcare provider.  Report any adverse effects and or reactions from your medicines to your outpatient provider promptly.  Patient is instructed and cautioned to not engage in alcohol and or illegal drug use while on prescription medicines.  In the event of worsening symptoms, patient is instructed to call the crisis hotline, 911 and or go to the nearest ED for appropriate evaluation and treatment of symptoms.  Follow-up with your primary care provider for your other medical issues, concerns and or health care needs.   Total Discharge Time: Greater than 30 minutes   Signed: DAVIS, LAURA NP-C 04/18/2015, 3:25 PM   Patient seen, Suicide Assessment Completed.  Disposition Plan Reviewed

## 2015-04-21 NOTE — Progress Notes (Signed)
Patient Discharge Instructions:  After Visit Summary (AVS):   Faxed to:  04/21/15 Psychiatric Admission Assessment Note:   Faxed to:  04/21/15 Suicide Risk Assessment - Discharge Assessment:   Faxed to:  04/21/15 Faxed/Sent to the Next Level Care provider:  04/21/15 Faxed to [facility] @ [phone] Patsey Berthold, 04/21/2015, 3:59 PM

## 2015-05-11 ENCOUNTER — Other Ambulatory Visit: Payer: Self-pay | Admitting: Family Medicine

## 2015-05-11 NOTE — Telephone Encounter (Signed)
Are these okay? Looks like patient may be due for appt-if due, what type of appt would you like me to schedule?

## 2015-05-12 NOTE — Telephone Encounter (Signed)
Called pt and left message on home phone for pt to call back. Will send in med for 30 days

## 2015-05-12 NOTE — Telephone Encounter (Signed)
Renew the medications until he can come in for an appointment

## 2015-06-10 ENCOUNTER — Other Ambulatory Visit: Payer: Self-pay | Admitting: Family Medicine

## 2015-07-11 ENCOUNTER — Other Ambulatory Visit: Payer: Self-pay | Admitting: Family Medicine

## 2015-08-09 ENCOUNTER — Other Ambulatory Visit: Payer: Self-pay | Admitting: Family Medicine

## 2015-09-30 ENCOUNTER — Other Ambulatory Visit: Payer: Self-pay | Admitting: Family Medicine

## 2015-10-06 ENCOUNTER — Other Ambulatory Visit: Payer: Self-pay | Admitting: Family Medicine

## 2015-10-06 NOTE — Telephone Encounter (Signed)
Is this ok to refill?  

## 2015-10-07 ENCOUNTER — Other Ambulatory Visit: Payer: Self-pay | Admitting: Family Medicine

## 2015-10-12 ENCOUNTER — Ambulatory Visit (INDEPENDENT_AMBULATORY_CARE_PROVIDER_SITE_OTHER): Payer: 59 | Admitting: Family Medicine

## 2015-10-12 ENCOUNTER — Encounter: Payer: Self-pay | Admitting: Family Medicine

## 2015-10-12 VITALS — BP 126/90 | Ht 71.0 in | Wt 259.0 lb

## 2015-10-12 DIAGNOSIS — F332 Major depressive disorder, recurrent severe without psychotic features: Secondary | ICD-10-CM | POA: Diagnosis not present

## 2015-10-12 DIAGNOSIS — E669 Obesity, unspecified: Secondary | ICD-10-CM | POA: Diagnosis not present

## 2015-10-12 DIAGNOSIS — I10 Essential (primary) hypertension: Secondary | ICD-10-CM | POA: Diagnosis not present

## 2015-10-12 DIAGNOSIS — Z8249 Family history of ischemic heart disease and other diseases of the circulatory system: Secondary | ICD-10-CM

## 2015-10-12 NOTE — Progress Notes (Signed)
   Subjective:    Patient ID: Russell Peters, male    DOB: 02-10-1966, 49 y.o.   MRN: [id number]  HPI He is here for an anginal evaluation. He has had a very stormy last year. He was in and out of psychiatric unit. Since and he is being followed by [name] and seeing Josph Macho me. He states he is doing much better. He has higher self-esteem and is not letting people run all over him when she had in the past. He is now separated and seems to be handling this well. His work is also better as he has changed his attitude towards work. 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 He still does drink status and he can have either no drinks or 2 per night and states this is not a problem. He is planning on moving out of the house and into an apartment. He is looking forward to this.   Review of Systems     Objective:   Physical Exam alert and in no distress. His affect is appropriate.      Assessment & Plan:  Severe episode of recurrent major depressive disorder, without psychotic features ([place])  Obesity  Essential hypertension  he will continue to be followed by his psychiatrist and continue in counseling. Encouraged him to do this when she plans to. Then discussed the fact that the next thing he needs to be doing scintigraphy care of himself in regard to diet and exercise. We discussed diet and exercise regard to his family history. He does have a positive family history of heart disease with his father having bypass grafting in his 6s as well as an uncle and grandfather with heart disease.

## 2015-11-04 ENCOUNTER — Other Ambulatory Visit: Payer: Self-pay | Admitting: Family Medicine

## 2016-02-07 ENCOUNTER — Encounter: Payer: Self-pay | Admitting: Family Medicine

## 2016-02-07 ENCOUNTER — Ambulatory Visit (INDEPENDENT_AMBULATORY_CARE_PROVIDER_SITE_OTHER): Payer: 59 | Admitting: Family Medicine

## 2016-02-07 VITALS — BP 124/90 | HR 84 | Temp 99.3°F | Resp 16 | Wt 272.6 lb

## 2016-02-07 DIAGNOSIS — R6889 Other general symptoms and signs: Secondary | ICD-10-CM

## 2016-02-07 LAB — POC INFLUENZA A&B (BINAX/QUICKVUE)
Influenza A, POC: NEGATIVE
Influenza B, POC: NEGATIVE

## 2016-02-07 NOTE — Addendum Note (Signed)
Addended by: Minette Headland A on: 02/07/2016 02:27 PM   Modules accepted: Orders

## 2016-02-07 NOTE — Progress Notes (Signed)
   Subjective:    Patient ID: Russell Peters, male    DOB: 07/08/1966, 50 y.o.   MRN: [id number]  HPI Chief Complaint  Patient presents with  . possible flu    chills, achy body, cough, chest hurting   He is here with complaints of a 3 day history of body aches, dry cough, sore throat, pain to throat and chest with deep inspiration, and chills. Did not check his temperature at home but feels feverish.  Denies ear pain, sinus pressure, nausea, vomiting, diarrhea.  Does not smoke, quit in 2000. Positive sick contacts. No recent hospitalizations or antibiotic use. He did not receive flu shot.   He has taken thera- flu yesterday and today.   Reviewed allergies, medications, past medical and social history.   Review of Systems Pertinent positives and negatives in the history of present illness.     Objective:   Physical Exam BP 124/90 mmHg  Pulse 84  Temp(Src) 99.3 F (37.4 C) (Oral)  Resp 16  Wt 272 lb 9.6 oz (123.651 kg)  SpO2 98%  Alert and in no distress. No sinus tenderness. Nares red with clear drainage. Tympanic membranes and canals with cerumen but otherwise are normal. Pharyngeal area is erythematous without edema or exudate. Neck is supple without adenopathy. Cardiac exam shows a regular sinus rhythm without murmurs or gallops. Lungs are clear to auscultation.  Flu swab: negative     Assessment & Plan:  Flu-like symptoms  Discussed with patient that his symptoms are most likely related to viral etiology. Also discussed that a negative flu swab does not rule out flu 100% but even at this point in his illness, Tamiflu is not recommended. Discussed symptomatic treatment such as saltwater gargles, Tylenol or ibuprofen for fever and body aches. Also recommend staying well hydrated and taking over-the-counter Mucinex DM or Delsym for cough. He will let me know if he is not improving in the next 2-3 days or if he gets worse.

## 2016-02-07 NOTE — Patient Instructions (Signed)
Your flu test today was negative. I recommend treating your symptoms Use salt water gargles for your sore throat and take Tylenol or ibuprofen for fever and body aches. You can use over-the-counter Mucinex or Robitussin for cough and congestion.  Let me know if you are not improving in the next 2-3 days or if you get worse  Upper Respiratory Infection, Adult Most upper respiratory infections (URIs) are a viral infection of the air passages leading to the lungs. A URI affects the nose, throat, and upper air passages. The most common type of URI is nasopharyngitis and is typically referred to as "the common cold." URIs run their course and usually go away on their own. Most of the time, a URI does not require medical attention, but sometimes a bacterial infection in the upper airways can follow a viral infection. This is called a secondary infection. Sinus and middle ear infections are common types of secondary upper respiratory infections. Bacterial pneumonia can also complicate a URI. A URI can worsen asthma and chronic obstructive pulmonary disease (COPD). Sometimes, these complications can require emergency medical care and may be life threatening.  CAUSES Almost all URIs are caused by viruses. A virus is a type of germ and can spread from one person to another.  RISKS FACTORS You may be at risk for a URI if:   You smoke.   You have chronic heart or lung disease.  You have a weakened defense (immune) system.   You are very young or very old.   You have nasal allergies or asthma.  You work in crowded or poorly ventilated areas.  You work in health care facilities or schools. SIGNS AND SYMPTOMS  Symptoms typically develop 2-3 days after you come in contact with a cold virus. Most viral URIs last 7-10 days. However, viral URIs from the influenza virus (flu virus) can last 14-18 days and are typically more severe. Symptoms may include:   Runny or stuffy (congested) nose.   Sneezing.    Cough.   Sore throat.   Headache.   Fatigue.   Fever.   Loss of appetite.   Pain in your forehead, behind your eyes, and over your cheekbones (sinus pain).  Muscle aches.  DIAGNOSIS  Your health care provider may diagnose a URI by:  Physical exam.  Tests to check that your symptoms are not due to another condition such as:  Strep throat.  Sinusitis.  Pneumonia.  Asthma. TREATMENT  A URI goes away on its own with time. It cannot be cured with medicines, but medicines may be prescribed or recommended to relieve symptoms. Medicines may help:  Reduce your fever.  Reduce your cough.  Relieve nasal congestion. HOME CARE INSTRUCTIONS   Take medicines only as directed by your health care provider.   Gargle warm saltwater or take cough drops to comfort your throat as directed by your health care provider.  Use a warm mist humidifier or inhale steam from a shower to increase air moisture. This may make it easier to breathe.  Drink enough fluid to keep your urine clear or pale yellow.   Eat soups and other clear broths and maintain good nutrition.   Rest as needed.   Return to work when your temperature has returned to normal or as your health care provider advises. You may need to stay home longer to avoid infecting others. You can also use a face mask and careful hand washing to prevent spread of the virus.  Increase the usage  of your inhaler if you have asthma.   Do not use any tobacco products, including cigarettes, chewing tobacco, or electronic cigarettes. If you need help quitting, ask your health care provider. PREVENTION  The best way to protect yourself from getting a cold is to practice good hygiene.   Avoid oral or hand contact with people with cold symptoms.   Wash your hands often if contact occurs.  There is no clear evidence that vitamin C, vitamin E, echinacea, or exercise reduces the chance of developing a cold. However, it is  always recommended to get plenty of rest, exercise, and practice good nutrition.  SEEK MEDICAL CARE IF:   You are getting worse rather than better.   Your symptoms are not controlled by medicine.   You have chills.  You have worsening shortness of breath.  You have brown or red mucus.  You have yellow or brown nasal discharge.  You have pain in your face, especially when you bend forward.  You have a fever.  You have swollen neck glands.  You have pain while swallowing.  You have white areas in the back of your throat. SEEK IMMEDIATE MEDICAL CARE IF:   You have severe or persistent:  Headache.  Ear pain.  Sinus pain.  Chest pain.  You have chronic lung disease and any of the following:  Wheezing.  Prolonged cough.  Coughing up blood.  A change in your usual mucus.  You have a stiff neck.  You have changes in your:  Vision.  Hearing.  Thinking.  Mood. MAKE SURE YOU:   Understand these instructions.  Will watch your condition.  Will get help right away if you are not doing well or get worse.   This information is not intended to replace advice given to you by your health care provider. Make sure you discuss any questions you have with your health care provider.   Document Released: 06/04/2001 Document Revised: 04/25/2015 Document Reviewed: 03/16/2014 Elsevier Interactive Patient Education [employer].

## 2016-05-27 ENCOUNTER — Ambulatory Visit (INDEPENDENT_AMBULATORY_CARE_PROVIDER_SITE_OTHER): Payer: 59 | Admitting: Family Medicine

## 2016-05-27 VITALS — BP 140/90 | HR 79 | Wt 267.0 lb

## 2016-05-27 DIAGNOSIS — L259 Unspecified contact dermatitis, unspecified cause: Secondary | ICD-10-CM

## 2016-05-27 NOTE — Progress Notes (Signed)
   Subjective:    Patient ID: Russell Peters, male    DOB: 08-Feb-1966, 50 y.o.   MRN: [id number]  HPI He is here for evaluation of a rash present on both forearms. This occurred after he did some yard work.   Review of Systems     Objective:   Physical Exam  Erythematous raised vesicular linear lesions are noted on the forearms as well as scattered macular type lesions.     Assessment & Plan:  Contact dermatitis Recommends supportive care with cool compresses, cortisone cream and Allegra or Claritin at night. He has difficulty with Benadryl lasting several days.

## 2016-05-27 NOTE — Patient Instructions (Signed)
Cortisone cream, cold compresses Claritin or Allegra

## 2016-09-24 ENCOUNTER — Ambulatory Visit (INDEPENDENT_AMBULATORY_CARE_PROVIDER_SITE_OTHER): Payer: 59 | Admitting: Family Medicine

## 2016-09-24 VITALS — BP 150/100 | HR 90 | Temp 98.0°F | Wt 263.0 lb

## 2016-09-24 DIAGNOSIS — J040 Acute laryngitis: Secondary | ICD-10-CM

## 2016-09-24 DIAGNOSIS — J209 Acute bronchitis, unspecified: Secondary | ICD-10-CM | POA: Diagnosis not present

## 2016-09-24 MED ORDER — LEVOFLOXACIN 500 MG PO TABS: 500.0000 mg | ORAL_TABLET | Freq: Every day | ORAL | 0 refills | Status: DC

## 2016-09-24 NOTE — Progress Notes (Signed)
   Subjective:    Patient ID: Russell Peters, male    DOB: 04-11-1966, 50 y.o.   MRN: [id number]  HPI A proximally 3 weeks ago he had difficulty with nasal congestion, rhinorrhea, generalized tooth discomfort and chest congestion. This slowly got better however approximately one week ago he developed a hoarse voice and intermittently productive cough that was brownish in appearance as well as a slight headache but no sore throat, earache, fever or chills.   Review of Systems     Objective:   Physical Exam Alert and in no distress. Tympanic membranes and canals are normal. Pharyngeal area is normal. Neck is supple without adenopathy or thyromegaly. Cardiac exam shows a regular sinus rhythm without murmurs or gallops. Lungs are clear to auscultation. Hoarse voice noted.      Assessment & Plan:  Laryngitis - Plan: levofloxacin (LEVAQUIN) 500 MG tablet  Acute bronchitis, unspecified organism - Plan: levofloxacin (LEVAQUIN) 500 MG tablet Since he has had symptoms for this long, I decided to place him on Levaquin. The trouble.

## 2016-11-26 ENCOUNTER — Other Ambulatory Visit: Payer: Self-pay | Admitting: Family Medicine

## 2017-01-03 ENCOUNTER — Other Ambulatory Visit: Payer: Self-pay | Admitting: Family Medicine

## 2017-01-08 DIAGNOSIS — G4733 Obstructive sleep apnea (adult) (pediatric): Secondary | ICD-10-CM | POA: Diagnosis not present

## 2017-01-16 DIAGNOSIS — L219 Seborrheic dermatitis, unspecified: Secondary | ICD-10-CM | POA: Diagnosis not present

## 2017-01-16 DIAGNOSIS — L308 Other specified dermatitis: Secondary | ICD-10-CM | POA: Diagnosis not present

## 2017-01-21 ENCOUNTER — Other Ambulatory Visit: Payer: Self-pay | Admitting: Family Medicine

## 2017-02-04 ENCOUNTER — Other Ambulatory Visit: Payer: Self-pay | Admitting: Family Medicine

## 2017-02-04 ENCOUNTER — Encounter: Payer: Self-pay | Admitting: Family Medicine

## 2017-02-04 ENCOUNTER — Ambulatory Visit (INDEPENDENT_AMBULATORY_CARE_PROVIDER_SITE_OTHER): Payer: 59 | Admitting: Family Medicine

## 2017-02-04 VITALS — BP 122/72 | HR 67 | Ht 71.0 in | Wt 264.4 lb

## 2017-02-04 DIAGNOSIS — E039 Hypothyroidism, unspecified: Secondary | ICD-10-CM | POA: Diagnosis not present

## 2017-02-04 DIAGNOSIS — F988 Other specified behavioral and emotional disorders with onset usually occurring in childhood and adolescence: Secondary | ICD-10-CM | POA: Diagnosis not present

## 2017-02-04 DIAGNOSIS — Z23 Encounter for immunization: Secondary | ICD-10-CM | POA: Diagnosis not present

## 2017-02-04 DIAGNOSIS — I10 Essential (primary) hypertension: Secondary | ICD-10-CM | POA: Diagnosis not present

## 2017-02-04 DIAGNOSIS — F332 Major depressive disorder, recurrent severe without psychotic features: Secondary | ICD-10-CM

## 2017-02-04 DIAGNOSIS — Z8249 Family history of ischemic heart disease and other diseases of the circulatory system: Secondary | ICD-10-CM

## 2017-02-04 DIAGNOSIS — E6609 Other obesity due to excess calories: Secondary | ICD-10-CM

## 2017-02-04 DIAGNOSIS — Z6837 Body mass index (BMI) 37.0-37.9, adult: Secondary | ICD-10-CM

## 2017-02-04 DIAGNOSIS — K219 Gastro-esophageal reflux disease without esophagitis: Secondary | ICD-10-CM

## 2017-02-04 DIAGNOSIS — Z0000 Encounter for general adult medical examination without abnormal findings: Secondary | ICD-10-CM

## 2017-02-04 LAB — POCT URINALYSIS DIPSTICK
Bilirubin, UA: NEGATIVE
Blood, UA: NEGATIVE
Glucose, UA: NEGATIVE
Ketones, UA: NEGATIVE
Leukocytes, UA: NEGATIVE
Nitrite, UA: NEGATIVE
Protein, UA: NEGATIVE
Spec Grav, UA: 1.02
Urobilinogen, UA: NEGATIVE
pH, UA: 7.5

## 2017-02-04 LAB — COMPREHENSIVE METABOLIC PANEL
ALT: 28 U/L (ref 9–46)
AST: 21 U/L (ref 10–40)
Albumin: 4.4 g/dL (ref 3.6–5.1)
Alkaline Phosphatase: 49 U/L (ref 40–115)
BUN: 9 mg/dL (ref 7–25)
CO2: 28 mmol/L (ref 20–31)
Calcium: 9.7 mg/dL (ref 8.6–10.3)
Chloride: 103 mmol/L (ref 98–110)
Creat: 1.12 mg/dL (ref 0.60–1.35)
Glucose, Bld: 117 mg/dL — ABNORMAL HIGH (ref 65–99)
Potassium: 4.1 mmol/L (ref 3.5–5.3)
Sodium: 139 mmol/L (ref 135–146)
Total Bilirubin: 1.5 mg/dL — ABNORMAL HIGH (ref 0.2–1.2)
Total Protein: 6.7 g/dL (ref 6.1–8.1)

## 2017-02-04 LAB — CBC WITH DIFFERENTIAL/PLATELET
Basophils Absolute: 0 cells/uL (ref 0–200)
Basophils Relative: 0 %
Eosinophils Absolute: 132 cells/uL (ref 15–500)
Eosinophils Relative: 2 %
HCT: 45 % (ref 38.5–50.0)
Hemoglobin: 15.5 g/dL (ref 13.2–17.1)
Lymphocytes Relative: 39 %
Lymphs Abs: 2574 cells/uL (ref 850–3900)
MCH: 31.6 pg (ref 27.0–33.0)
MCHC: 34.4 g/dL (ref 32.0–36.0)
MCV: 91.6 fL (ref 80.0–100.0)
MPV: 9.4 fL (ref 7.5–12.5)
Monocytes Absolute: 462 cells/uL (ref 200–950)
Monocytes Relative: 7 %
Neutro Abs: 3432 cells/uL (ref 1500–7800)
Neutrophils Relative %: 52 %
Platelets: 296 10*3/uL (ref 140–400)
RBC: 4.91 MIL/uL (ref 4.20–5.80)
RDW: 13.4 % (ref 11.0–15.0)
WBC: 6.6 10*3/uL (ref 4.0–10.5)

## 2017-02-04 LAB — TSH: TSH: 2.07 mIU/L (ref 0.40–4.50)

## 2017-02-04 LAB — LIPID PANEL
Cholesterol: 193 mg/dL (ref <200)
HDL: 41 mg/dL (ref >40)
LDL Cholesterol: 134 mg/dL — ABNORMAL HIGH (ref <100)
Total CHOL/HDL Ratio: 4.7 Ratio (ref <5.0)
Triglycerides: 91 mg/dL (ref <150)
VLDL: 18 mg/dL (ref <30)

## 2017-02-04 MED ORDER — LISINOPRIL-HYDROCHLOROTHIAZIDE 10-12.5 MG PO TABS: 1.0000 | ORAL_TABLET | Freq: Every day | ORAL | 3 refills | Status: DC

## 2017-02-04 NOTE — Progress Notes (Signed)
Subjective:    Patient ID: JAHOD RADDE, male    DOB: 1966/07/26, 51 y.o.   MRN: [id number]  HPI Here for complete examination. He does have underlying hypertension and continues on lisinopril/HCTZ. He is also taking Synthroid. He does take Prilosec for reflux type symptoms. He is followed by [name] for treatment of his ADD and for depression. He is taking Wellbutrin and Adderall. He is also involved in counseling with Darryl Hiers. He is making slow progress. There is a family history of heart disease but presently he is not on a statin. He has no other concerns or complaints. Family and social history as well as health maintenance and immunizations were reviewed  Review of Systems  All other systems reviewed and are negative.      Objective:   Physical Exam BP 122/72   Pulse 67   Ht 5\' 11"  (1.803 m)   Wt 264 lb 6.4 oz (119.9 kg)   SpO2 99%   BMI 36.88 kg/m   General Appearance:    Alert, cooperative, no distress, appears stated age  Head:    Normocephalic, without obvious abnormality, atraumatic  Eyes:    PERRL, conjunctiva/corneas clear, EOM's intact, fundi    benign  Ears:    Normal TM's and external ear canals  Nose:   Nares normal, mucosa normal, no drainage or sinus   tenderness  Throat:   Lips, mucosa, and tongue normal; teeth and gums normal  Neck:   Supple, no lymphadenopathy;  thyroid:  no   enlargement/tenderness/nodules; no carotid   bruit or JVD     Lungs:     Clear to auscultation bilaterally without wheezes, rales or     ronchi; respirations unlabored      Heart:    Regular rate and rhythm, S1 and S2 normal, no murmur, rub   or gallop     Abdomen:     Soft, non-tender, nondistended, normoactive bowel sounds,    no masses, no hepatosplenomegaly  Genitalia:    Normal male external genitalia without lesions.  Testicles without masses.  No inguinal hernias.  Rectal:    Deferred .  Extremities:   No clubbing, cyanosis or edema  Pulses:   2+ and symmetric  all extremities  Skin:   Skin color, texture, turgor normal, no rashes or lesions  Lymph nodes:   Cervical, supraclavicular, and axillary nodes normal  Neurologic:   CNII-XII intact, normal strength, sensation and gait; reflexes 2+ and symmetric throughout          Psych:   Normal mood, affect, hygiene and grooming.          Assessment & Plan:  Routine general medical examination at a health care facility - Plan: POCT Urinalysis Dipstick, CBC with Differential/Platelet, Comprehensive metabolic panel, Lipid panel, TSH  Hypothyroidism, unspecified type - Plan: TSH  Essential hypertension - Plan: CBC with Differential/Platelet, Comprehensive metabolic panel, lisinopril-hydrochlorothiazide (PRINZIDE,ZESTORETIC) 10-12.5 MG tablet  Class 2 obesity due to excess calories without serious comorbidity with body mass index (BMI) of 37.0 to 37.9 in adult - Plan: CBC with Differential/Platelet, Comprehensive metabolic panel, Lipid panel  Need for Tdap vaccination - Plan: Tdap vaccine greater than or equal to 7yo IM  Attention deficit disorder, unspecified hyperactivity presence  Family history of heart disease in male family member before age 87  Major depressive disorder, recurrent, severe without psychotic features ([place])  Gastroesophageal reflux disease, esophagitis presence not specified He will continue on his present medication regimen. I will  do routine blood screening on him. Encouraged him to continue in counseling as he is making slow progress. His immunizations were updated.

## 2017-02-06 LAB — HEMOGLOBIN A1C
HEMOGLOBIN A1C: 6.2 % — AB (ref <5.7)
Mean Plasma Glucose: 131 mg/dL

## 2017-02-13 ENCOUNTER — Other Ambulatory Visit: Payer: Self-pay | Admitting: Medical

## 2017-02-18 DIAGNOSIS — L309 Dermatitis, unspecified: Secondary | ICD-10-CM | POA: Diagnosis not present

## 2017-02-18 DIAGNOSIS — D485 Neoplasm of uncertain behavior of skin: Secondary | ICD-10-CM | POA: Diagnosis not present

## 2017-02-18 DIAGNOSIS — L219 Seborrheic dermatitis, unspecified: Secondary | ICD-10-CM | POA: Diagnosis not present

## 2017-02-18 DIAGNOSIS — L308 Other specified dermatitis: Secondary | ICD-10-CM | POA: Diagnosis not present

## 2017-03-18 ENCOUNTER — Telehealth: Payer: Self-pay | Admitting: Family Medicine

## 2017-03-18 NOTE — Telephone Encounter (Signed)
I have informed pt word for word he verbalized understanding

## 2017-03-18 NOTE — Telephone Encounter (Signed)
If there concern is psychological. Then the note from [name] is what is needed not necessarily one from the

## 2017-03-18 NOTE — Telephone Encounter (Signed)
Pt requested that Dr Redmond School write a letter about the state of his health because he applied for life insurance and it was denied based on his health. Call pt when the letter is ready.

## 2017-03-18 NOTE — Telephone Encounter (Signed)
That he is not a threat to his self he has been turned down twice Dr.Car is going to write a letter also

## 2017-03-18 NOTE — Telephone Encounter (Signed)
I need more information as to what their concerns were

## 2017-03-24 DIAGNOSIS — L219 Seborrheic dermatitis, unspecified: Secondary | ICD-10-CM | POA: Diagnosis not present

## 2017-04-08 DIAGNOSIS — G4733 Obstructive sleep apnea (adult) (pediatric): Secondary | ICD-10-CM | POA: Diagnosis not present

## 2017-06-30 DIAGNOSIS — L219 Seborrheic dermatitis, unspecified: Secondary | ICD-10-CM | POA: Diagnosis not present

## 2017-06-30 DIAGNOSIS — L719 Rosacea, unspecified: Secondary | ICD-10-CM | POA: Diagnosis not present

## 2017-07-11 DIAGNOSIS — G4733 Obstructive sleep apnea (adult) (pediatric): Secondary | ICD-10-CM | POA: Diagnosis not present

## 2017-08-17 ENCOUNTER — Other Ambulatory Visit: Payer: Self-pay | Admitting: Family Medicine

## 2017-10-13 DIAGNOSIS — G4733 Obstructive sleep apnea (adult) (pediatric): Secondary | ICD-10-CM | POA: Diagnosis not present

## 2017-10-24 DIAGNOSIS — M5416 Radiculopathy, lumbar region: Secondary | ICD-10-CM | POA: Diagnosis not present

## 2017-11-06 ENCOUNTER — Other Ambulatory Visit: Payer: Self-pay | Admitting: Neurosurgery

## 2017-11-06 DIAGNOSIS — M5416 Radiculopathy, lumbar region: Secondary | ICD-10-CM

## 2017-11-19 ENCOUNTER — Ambulatory Visit
Admission: RE | Admit: 2017-11-19 | Discharge: 2017-11-19 | Disposition: A | Discharge status: Home / Self Care | Payer: 59 | Source: Ambulatory Visit | Attending: Neurosurgery | Admitting: Neurosurgery

## 2017-11-19 DIAGNOSIS — M5416 Radiculopathy, lumbar region: Secondary | ICD-10-CM

## 2017-11-19 DIAGNOSIS — M48061 Spinal stenosis, lumbar region without neurogenic claudication: Secondary | ICD-10-CM | POA: Diagnosis not present

## 2017-11-26 DIAGNOSIS — M5416 Radiculopathy, lumbar region: Secondary | ICD-10-CM | POA: Diagnosis not present

## 2017-11-26 DIAGNOSIS — I10 Essential (primary) hypertension: Secondary | ICD-10-CM | POA: Diagnosis not present

## 2017-12-22 DIAGNOSIS — M48062 Spinal stenosis, lumbar region with neurogenic claudication: Secondary | ICD-10-CM | POA: Diagnosis not present

## 2017-12-22 DIAGNOSIS — M461 Sacroiliitis, not elsewhere classified: Secondary | ICD-10-CM | POA: Diagnosis not present

## 2017-12-22 DIAGNOSIS — M5416 Radiculopathy, lumbar region: Secondary | ICD-10-CM | POA: Diagnosis not present

## 2017-12-29 DIAGNOSIS — M5416 Radiculopathy, lumbar region: Secondary | ICD-10-CM | POA: Diagnosis not present

## 2018-01-01 DIAGNOSIS — M5416 Radiculopathy, lumbar region: Secondary | ICD-10-CM | POA: Diagnosis not present

## 2018-01-02 DIAGNOSIS — M48062 Spinal stenosis, lumbar region with neurogenic claudication: Secondary | ICD-10-CM | POA: Diagnosis not present

## 2018-01-02 DIAGNOSIS — M5416 Radiculopathy, lumbar region: Secondary | ICD-10-CM | POA: Diagnosis not present

## 2018-01-05 DIAGNOSIS — M5416 Radiculopathy, lumbar region: Secondary | ICD-10-CM | POA: Diagnosis not present

## 2018-01-08 DIAGNOSIS — M5416 Radiculopathy, lumbar region: Secondary | ICD-10-CM | POA: Diagnosis not present

## 2018-01-13 DIAGNOSIS — M5416 Radiculopathy, lumbar region: Secondary | ICD-10-CM | POA: Diagnosis not present

## 2018-01-15 DIAGNOSIS — M5416 Radiculopathy, lumbar region: Secondary | ICD-10-CM | POA: Diagnosis not present

## 2018-01-19 DIAGNOSIS — M5416 Radiculopathy, lumbar region: Secondary | ICD-10-CM | POA: Diagnosis not present

## 2018-01-21 DIAGNOSIS — M5416 Radiculopathy, lumbar region: Secondary | ICD-10-CM | POA: Diagnosis not present

## 2018-01-29 DIAGNOSIS — M5416 Radiculopathy, lumbar region: Secondary | ICD-10-CM | POA: Diagnosis not present

## 2018-01-31 DIAGNOSIS — M5416 Radiculopathy, lumbar region: Secondary | ICD-10-CM | POA: Diagnosis not present

## 2018-02-02 DIAGNOSIS — M5416 Radiculopathy, lumbar region: Secondary | ICD-10-CM | POA: Diagnosis not present

## 2018-02-05 DIAGNOSIS — M48062 Spinal stenosis, lumbar region with neurogenic claudication: Secondary | ICD-10-CM | POA: Diagnosis not present

## 2018-02-05 DIAGNOSIS — M5416 Radiculopathy, lumbar region: Secondary | ICD-10-CM | POA: Diagnosis not present

## 2018-02-05 DIAGNOSIS — M461 Sacroiliitis, not elsewhere classified: Secondary | ICD-10-CM | POA: Diagnosis not present

## 2018-02-05 DIAGNOSIS — Z6834 Body mass index (BMI) 34.0-34.9, adult: Secondary | ICD-10-CM | POA: Diagnosis not present

## 2018-02-09 DIAGNOSIS — M5416 Radiculopathy, lumbar region: Secondary | ICD-10-CM | POA: Diagnosis not present

## 2018-02-11 DIAGNOSIS — M461 Sacroiliitis, not elsewhere classified: Secondary | ICD-10-CM | POA: Diagnosis not present

## 2018-02-11 DIAGNOSIS — M5416 Radiculopathy, lumbar region: Secondary | ICD-10-CM | POA: Diagnosis not present

## 2018-02-23 ENCOUNTER — Ambulatory Visit (INDEPENDENT_AMBULATORY_CARE_PROVIDER_SITE_OTHER): Payer: 59 | Admitting: Physician Assistant

## 2018-02-23 ENCOUNTER — Encounter (INDEPENDENT_AMBULATORY_CARE_PROVIDER_SITE_OTHER): Payer: Self-pay | Admitting: Physician Assistant

## 2018-02-23 DIAGNOSIS — G8929 Other chronic pain: Secondary | ICD-10-CM | POA: Diagnosis not present

## 2018-02-23 DIAGNOSIS — M545 Low back pain: Secondary | ICD-10-CM | POA: Diagnosis not present

## 2018-02-23 NOTE — Progress Notes (Signed)
Office Visit Note   Patient: Russell Peters           Date of Birth: [birth year]           MRN: [id number] Visit Date: 02/23/2018              Requested by: Denita Lung, MD [address] [place], [place] [address] PCP: Denita Lung, MD   Assessment & Plan: Visit Diagnoses:  1. Chronic bilateral low back pain without sciatica     Plan: Have him undergo an epidural steroid injection with Dr. Ernestina Patches and follow-up with [place] in 2 weeks check his progress lack of.  He is to obtain his office notes from Dr. Brien Few for the injections given on 01/02/18 and 01/05/18.  He will bring these with him today this point with Dr. Ernestina Patches for review.  Follow-Up Instructions: Return in about 2 weeks (around 03/09/2018).   Orders:  Orders Placed This Encounter  Procedures  . Ambulatory referral to Physical Medicine Rehab   No orders of the defined types were placed in this encounter.     Procedures: No procedures performed   Clinical Data: No additional findings.   Subjective: Chief Complaint  Patient presents with  . Lower Back - Pain    HPI That is been ongoing since September 2018.  Had no injury.  Pain is becoming progressively worse.  Dr. Verlan Friends neurosurgery ordered an MRI of recommended physical therapy and epidural steroid injections based on the MRI.  He has undergone physical therapy without relief.  He reports having epidural steroid injection lumbar spine group ports of the lower lumbar L4-5 but he is unsure does not have any records with him today.  He also had SI joint injections 01/05/2018 bilaterally but again has no records with him today.  His pain is best whenever he is sitting.  He has no radicular symptoms down either leg.  He reports that he feels 1 of his hips is higher than the other hip.  And feels that this may be contributing to his low back pain.  He did undergo an MRI of his lumbar spine which showed a moderately large central disc protrusion at  T12-L1 with cord flattening and mild spinal stenosis.  L3-4 degenerative disc disease with disc bulge.  Moderate spinal stenosis and moderate subarticular stenosis on the right.  L4-5 mild right foraminal narrowing.  L5-S1 mild disc and facet degeneration.  Review of Systems Denies any bowel or bladder dysfunction.  No awakening pain.  Positive for  difficulty sleeping.  Denies any radicular symptoms down either leg.  Otherwise review of systems negative.  Mr. Daubert is a 52 year old male who comes in today with low back pain  Objective: Vital Signs: There were no vitals taken for this visit.  Physical Exam  Constitutional: He is oriented to person, place, and time. He appears well-developed and well-nourished. No distress.  Cardiovascular: Intact distal pulses.  Pulmonary/Chest: Effort normal.  Neurological: He is alert and oriented to person, place, and time.  Skin: He is not diaphoretic.  Psychiatric: He has a normal mood and affect.    Ortho Exam Bilateral feet sensation intact to light touch throughout.  Deep tendon reflexes are 2+ at the knees and equal and symmetric and 2+ at the ankles and equal and symmetric.  5 strength throughout lower extremities bilaterally negative straight leg raise bilaterally.  Tenderness over the lower lumbar spinal column with palpation.  Able to walk on his heels and  tiptoes.  Fluid range of motion of both hips without pain.  No significant tenderness over the trochanteric region of either hip. Specialty Comments:  No specialty comments available.  Imaging: No results found.   PMFS History: Patient Active Problem List   Diagnosis Date Noted  . Attention deficit disorder 02/04/2017  . Gastroesophageal reflux disease 02/04/2017  . Family history of heart disease in male family member before age 64 10/12/2015  . Major depressive disorder, recurrent, severe without psychotic features ([place])   . Hypothyroid 05/16/2011  . Hypertension 05/16/2011  .  Obesity 05/16/2011   Past Medical History:  Diagnosis Date  . ADD (attention deficit disorder)   . Allergy   . Depression   . GERD (gastroesophageal reflux disease)   . Hypertension   . Sleep apnea   . Thyroid disease     History reviewed. No pertinent family history.  Past Surgical History:  Procedure Laterality Date  . NO PAST SURGERIES     Social History   Occupational History  . Not on file  Tobacco Use  . Smoking status: Former Smoker    Last attempt to quit: 12/23/1998    Years since quitting: 19.1  . Smokeless tobacco: Never Used  Substance and Sexual Activity  . Alcohol use: Yes    Comment: 3 beers weekly in past 8 months; 1 pt weekly liquor  . Drug use: No  . Sexual activity: No

## 2018-02-25 ENCOUNTER — Other Ambulatory Visit: Payer: Self-pay | Admitting: Family Medicine

## 2018-02-25 ENCOUNTER — Telehealth (INDEPENDENT_AMBULATORY_CARE_PROVIDER_SITE_OTHER): Payer: Self-pay | Admitting: Physician Assistant

## 2018-02-25 NOTE — Telephone Encounter (Signed)
Please advise 

## 2018-02-25 NOTE — Telephone Encounter (Signed)
Patient saw Artis Delay on 3/4, does not have his appt with Dr. Ernestina Patches until 4/2. He is wondering if he can be prescribed pain medication for his lower back pain to hold him over to his next appt. Please advise  # [phone]

## 2018-02-25 NOTE — Telephone Encounter (Signed)
Tramadol #[phone]

## 2018-02-26 ENCOUNTER — Other Ambulatory Visit (INDEPENDENT_AMBULATORY_CARE_PROVIDER_SITE_OTHER): Payer: Self-pay

## 2018-02-26 MED ORDER — TRAMADOL HCL 50 MG PO TABS: 50.0000 mg | ORAL_TABLET | Freq: Four times a day (QID) | ORAL | 0 refills | Status: DC | PRN

## 2018-02-26 NOTE — Telephone Encounter (Signed)
Called into his pharmacy 

## 2018-03-02 ENCOUNTER — Other Ambulatory Visit: Payer: Self-pay | Admitting: Family Medicine

## 2018-03-02 DIAGNOSIS — I10 Essential (primary) hypertension: Secondary | ICD-10-CM

## 2018-03-16 IMAGING — MR MR LUMBAR SPINE W/O CM
4 of 5 series · 18 of 48 positions shown · non-contrast
Comparison: None.

CLINICAL DATA: Lumbar radiculopathy.  Right leg pain and weakness

EXAM:
MRI LUMBAR SPINE WITHOUT CONTRAST
TECHNIQUE: Multiplanar, multisequence MR imaging of the lumbar spine was
performed. No intravenous contrast was administered.

[Series 6: T2 · sagittal · 4.0mm · 0.73mm/px · 6 of 17 slices shown (1 of 2)]
[im 1/17]
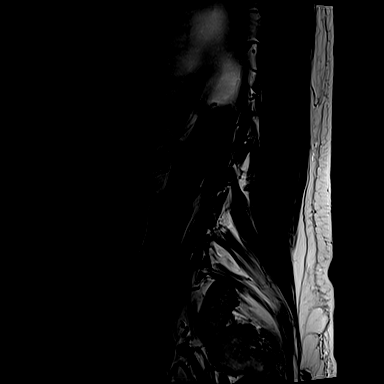
[im 4/17]
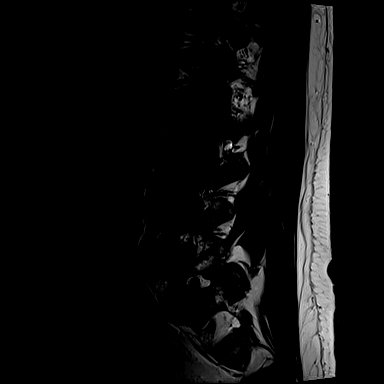
[im 7/17]
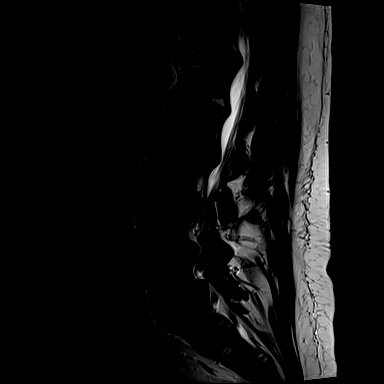
[im 10/17]
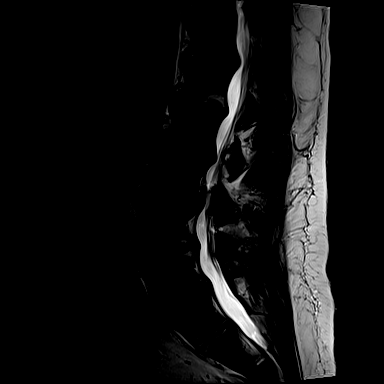
[im 13/17]
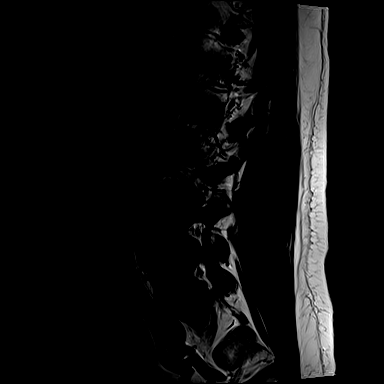
[im 17/17]
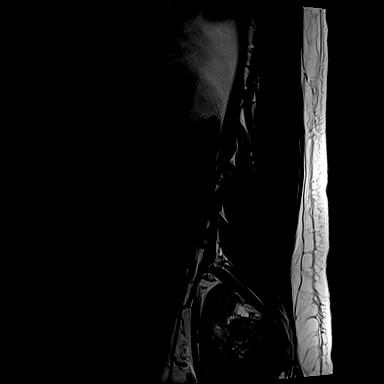

[Series 7: T1 · sagittal · 4.0mm · 0.73mm/px · 3 of 17 slices shown (1 of 2)]
[im 4/17]
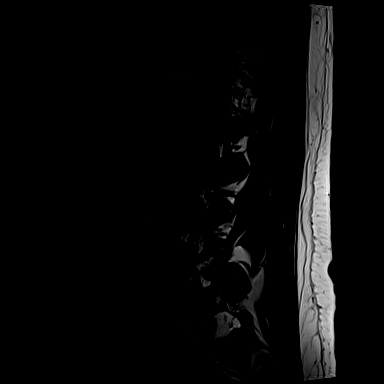
[im 10/17]
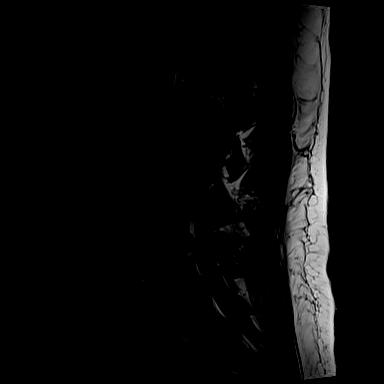
[im 17/17]
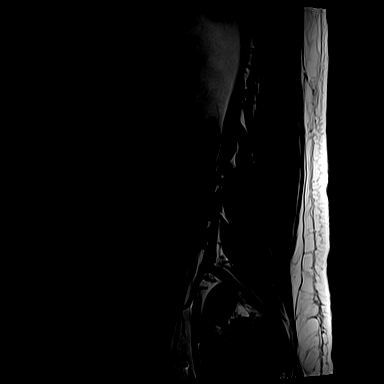

[Series 11: T1 · axial · 4.0mm · 0.28mm/px · z∈[-31,+128]mm · 3 of 41 slices shown (2 of 2)]
[im 6/41]
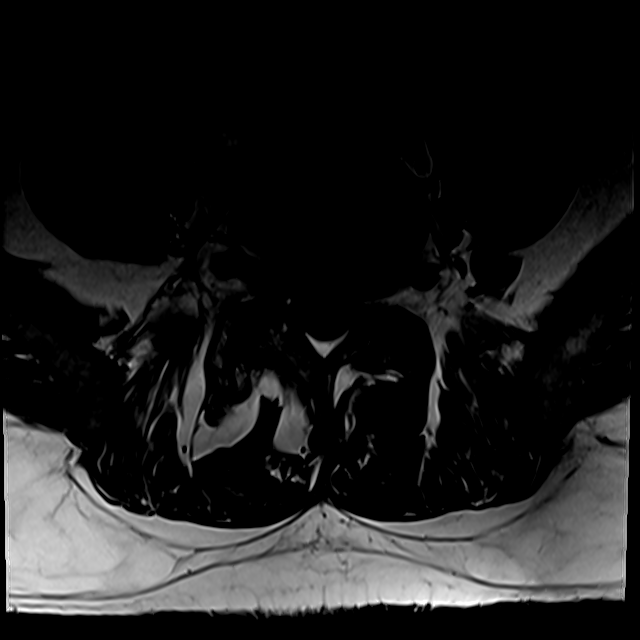
[im 21/41]
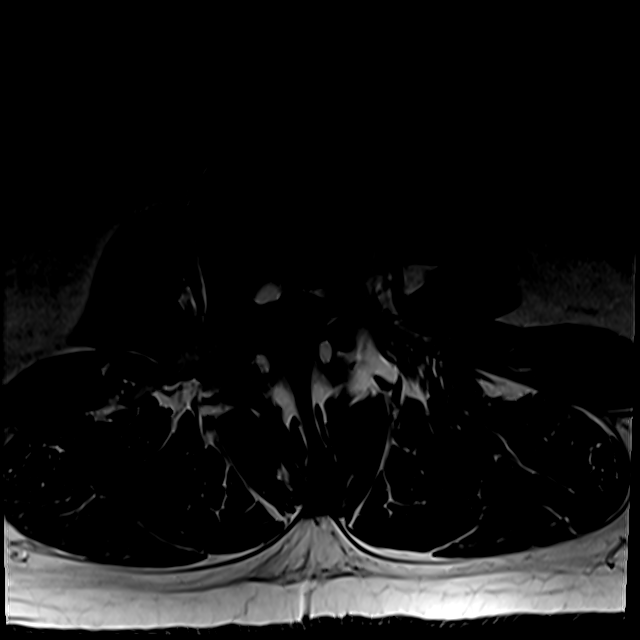
[im 35/41]
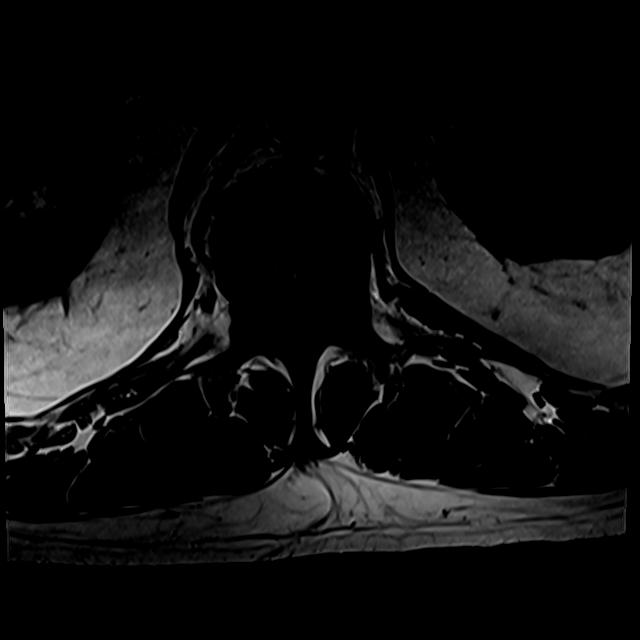

[Series 14: T2 · axial · 4.0mm · 0.28mm/px · z∈[-56,+128]mm · 6 of 41 slices shown (2 of 2)]
[im 1/41]
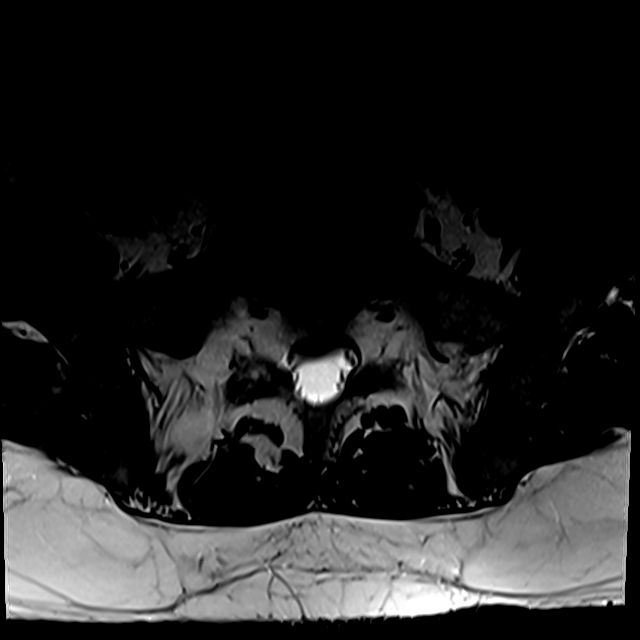
[im 6/41]
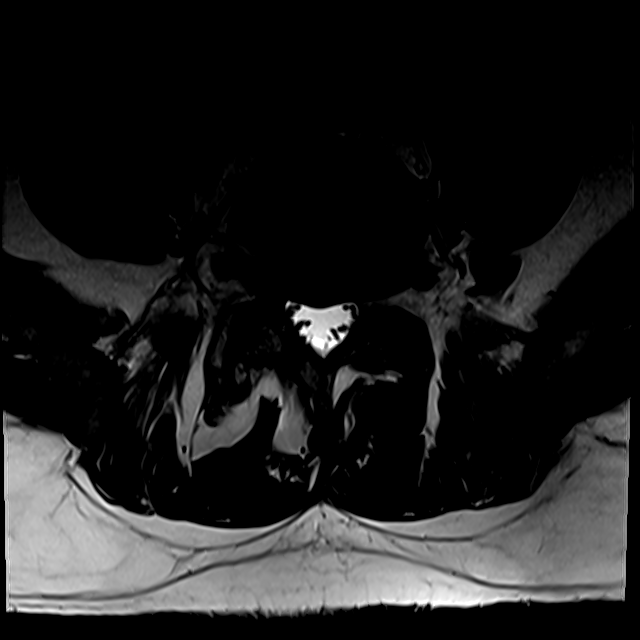
[im 12/41]
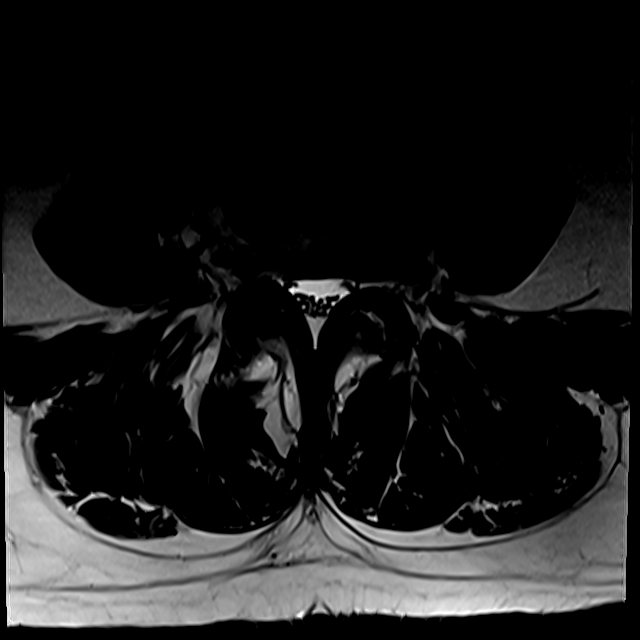
[im 18/41]
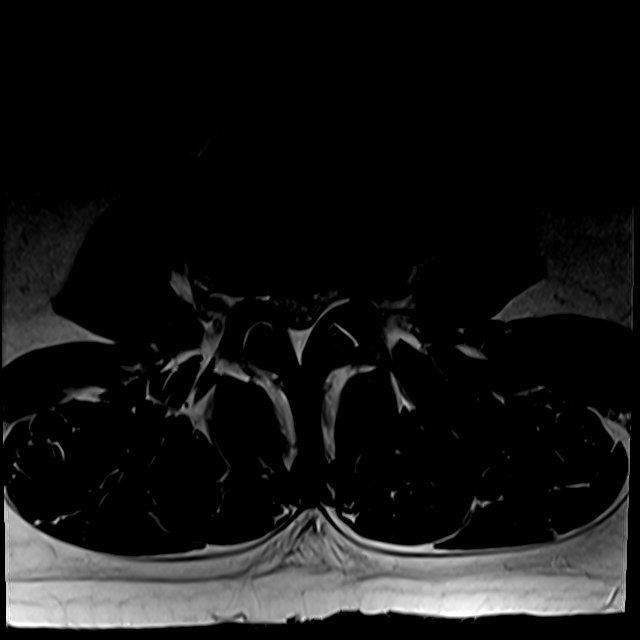
[im 21/41]
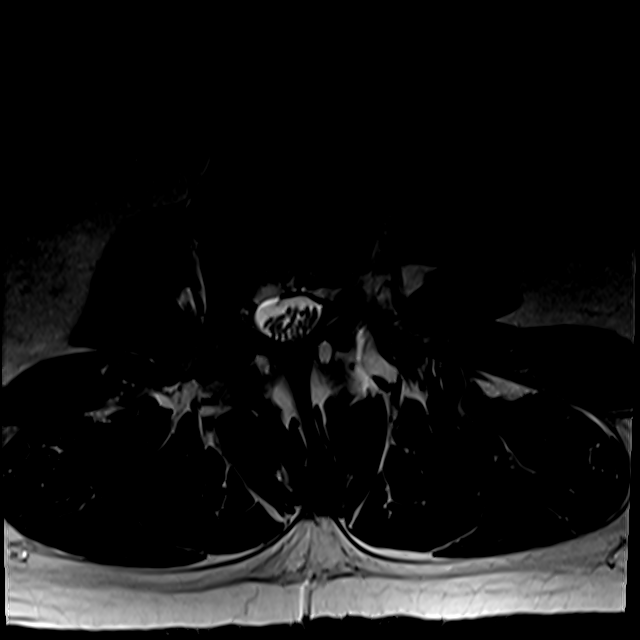
[im 35/41]
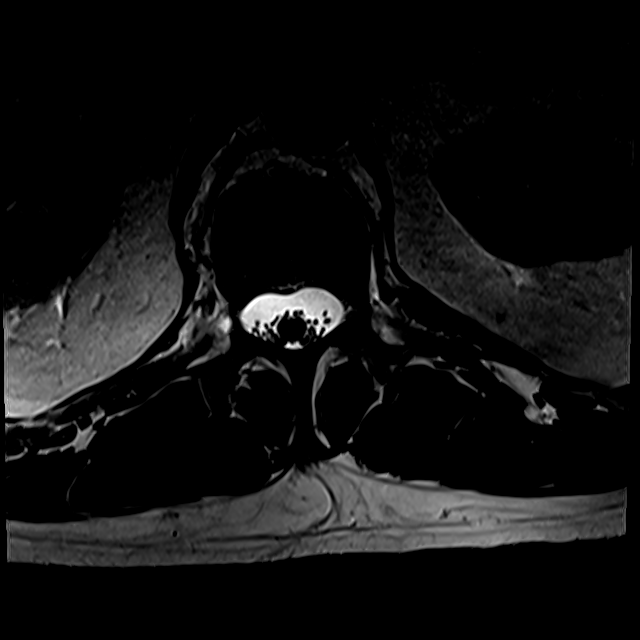

[18 of 48 positions shown; findings below may reference images not displayed]

FINDINGS: Segmentation:  Standard

Alignment: Mild retrolisthesis L2-3. Mild anterolisthesis L4-5 and
L5-S1.

Vertebrae:  Negative for fracture or mass

Conus medullaris and cauda equina: Conus extends to the L2 level.
Conus and cauda equina appear normal.

Paraspinal and other soft tissues: Negative for mass or adenopathy

Disc levels:

T12-L1: Moderately large central disc protrusion and spurring with
cord flattening and mild spinal stenosis

L1-2: Disc degeneration with Schmorl's node. Mild disc bulging and
spurring without stenosis

L2-3: Broad-based central disc protrusion to the right of midline
causing mild spinal stenosis.

L3-4: Disc degeneration with disc bulging and diffuse endplate
spurring. Mild facet and ligamentum flavum hypertrophy. Moderate
spinal stenosis. Moderate subarticular stenosis on the right.

L4-5: Disc degeneration with diffuse disc bulging and endplate
spurring. Mild facet degeneration and mild spinal stenosis. Mild
right foraminal narrowing

L5-S1: Small central disc protrusion and spurring. Mild disc and
facet degeneration. Mild subarticular stenosis.
IMPRESSION: Mild spinal stenosis T12-L1 due disc protrusion and spurring

Mild spinal stenosis L2-3

Moderate spinal stenosis L3-4 with right subarticular stenosis

Mild spinal stenosis L4-5 with mild right foraminal narrowing

Small central disc protrusion L5-S1 with mild subarticular stenosis.

## 2018-03-19 ENCOUNTER — Ambulatory Visit: Payer: 59 | Admitting: Family Medicine

## 2018-03-19 ENCOUNTER — Encounter: Payer: Self-pay | Admitting: Family Medicine

## 2018-03-19 VITALS — BP 126/84 | HR 91 | Wt 246.0 lb

## 2018-03-19 DIAGNOSIS — Z1211 Encounter for screening for malignant neoplasm of colon: Secondary | ICD-10-CM

## 2018-03-19 DIAGNOSIS — I10 Essential (primary) hypertension: Secondary | ICD-10-CM | POA: Diagnosis not present

## 2018-03-19 DIAGNOSIS — Z8249 Family history of ischemic heart disease and other diseases of the circulatory system: Secondary | ICD-10-CM

## 2018-03-19 DIAGNOSIS — M4800 Spinal stenosis, site unspecified: Secondary | ICD-10-CM | POA: Diagnosis not present

## 2018-03-19 DIAGNOSIS — F988 Other specified behavioral and emotional disorders with onset usually occurring in childhood and adolescence: Secondary | ICD-10-CM

## 2018-03-19 DIAGNOSIS — E039 Hypothyroidism, unspecified: Secondary | ICD-10-CM

## 2018-03-19 DIAGNOSIS — E6609 Other obesity due to excess calories: Secondary | ICD-10-CM | POA: Diagnosis not present

## 2018-03-19 DIAGNOSIS — Z6837 Body mass index (BMI) 37.0-37.9, adult: Secondary | ICD-10-CM | POA: Diagnosis not present

## 2018-03-19 DIAGNOSIS — K219 Gastro-esophageal reflux disease without esophagitis: Secondary | ICD-10-CM

## 2018-03-19 NOTE — Progress Notes (Signed)
   Subjective:    Patient ID: Russell Peters, male    DOB: July 17, 1966, 52 y.o.   MRN: [id number]  HPI He is here for a med check appointment.  Psychologically he seems to be doing well and at present time is not involved in counseling.  He stopped taking his antidepressant 1 year ago.  He has been followed by [name] and had also seen Darryl Hyers in the past.  He feels very good about where he is psychologically.  Continues on lisinopril/HCTZ.  He has had difficulty recently with spinal stenosis and is now going to [facility] to get a second opinion as previous epidural injections have not helped.  He has changed his diet and has lost several pounds.  He feels very good about this.  He has no 51 years old.  He has no previous history of GI problems and has no family history of colon cancer or colonic polyps. Review of Systems     Objective:   Physical Exam Alert and in no distress. Tympanic membranes and canals are normal. Pharyngeal area is normal. Neck is supple without adenopathy or thyromegaly. Cardiac exam shows a regular sinus rhythm without murmurs or gallops. Lungs are clear to auscultation.       Assessment & Plan:  Hypothyroidism, unspecified type - Plan: TSH  Essential hypertension - Plan: CBC with Differential/Platelet, Comprehensive metabolic panel  Class 2 obesity due to excess calories without serious comorbidity with body mass index (BMI) of 37.0 to 37.9 in adult - Plan: CBC with Differential/Platelet, Comprehensive metabolic panel, Lipid panel  Attention deficit disorder, unspecified hyperactivity presence  Gastroesophageal reflux disease, esophagitis presence not specified  Family history of heart disease in male family member before age 60  Spinal stenosis, unspecified spinal region  Screening for colon cancer - Plan: Cologuard Overall he is doing quite well.  He will need a note from me concerning life insurance.  At this point I think he is quite stable from a  psychological point of view.

## 2018-03-20 LAB — CBC WITH DIFFERENTIAL/PLATELET
BASOS ABS: 0 10*3/uL (ref 0.0–0.2)
Basos: 0 %
EOS (ABSOLUTE): 0.1 10*3/uL (ref 0.0–0.4)
Eos: 1 %
HEMATOCRIT: 46.2 % (ref 37.5–51.0)
HEMOGLOBIN: 15.9 g/dL (ref 13.0–17.7)
Immature Grans (Abs): 0 10*3/uL (ref 0.0–0.1)
Immature Granulocytes: 0 %
LYMPHS ABS: 3.1 10*3/uL (ref 0.7–3.1)
Lymphs: 32 %
MCH: 31.6 pg (ref 26.6–33.0)
MCHC: 34.4 g/dL (ref 31.5–35.7)
MCV: 92 fL (ref 79–97)
MONOCYTES: 8 %
Monocytes Absolute: 0.8 10*3/uL (ref 0.1–0.9)
NEUTROS ABS: 5.5 10*3/uL (ref 1.4–7.0)
Neutrophils: 59 %
Platelets: 301 10*3/uL (ref 150–379)
RBC: 5.03 x10E6/uL (ref 4.14–5.80)
RDW: 13.7 % (ref 12.3–15.4)
WBC: 9.5 10*3/uL (ref 3.4–10.8)

## 2018-03-20 LAB — COMPREHENSIVE METABOLIC PANEL
ALBUMIN: 4.7 g/dL (ref 3.5–5.5)
ALK PHOS: 59 IU/L (ref 39–117)
ALT: 24 IU/L (ref 0–44)
AST: 26 IU/L (ref 0–40)
Albumin/Globulin Ratio: 2.2 (ref 1.2–2.2)
BILIRUBIN TOTAL: 1.3 mg/dL — AB (ref 0.0–1.2)
BUN / CREAT RATIO: 19 (ref 9–20)
BUN: 19 mg/dL (ref 6–24)
CHLORIDE: 100 mmol/L (ref 96–106)
CO2: 21 mmol/L (ref 20–29)
Calcium: 9.7 mg/dL (ref 8.7–10.2)
Creatinine, Ser: 0.99 mg/dL (ref 0.76–1.27)
GFR calc Af Amer: 102 mL/min/{1.73_m2} (ref >59)
GFR calc non Af Amer: 88 mL/min/{1.73_m2} (ref >59)
Globulin, Total: 2.1 g/dL (ref 1.5–4.5)
Glucose: 106 mg/dL — ABNORMAL HIGH (ref 65–99)
POTASSIUM: 4.3 mmol/L (ref 3.5–5.2)
SODIUM: 140 mmol/L (ref 134–144)
Total Protein: 6.8 g/dL (ref 6.0–8.5)

## 2018-03-20 LAB — LIPID PANEL
CHOLESTEROL TOTAL: 209 mg/dL — AB (ref 100–199)
Chol/HDL Ratio: 3.7 ratio (ref 0.0–5.0)
HDL: 57 mg/dL (ref >39)
LDL Calculated: 134 mg/dL — ABNORMAL HIGH (ref 0–99)
Triglycerides: 92 mg/dL (ref 0–149)
VLDL Cholesterol Cal: 18 mg/dL (ref 5–40)

## 2018-03-20 LAB — TSH: TSH: 2.86 u[IU]/mL (ref 0.450–4.500)

## 2018-03-20 MED ORDER — SYNTHROID 88 MCG PO TABS: 88.0000 ug | ORAL_TABLET | Freq: Every day | ORAL | 3 refills | Status: DC

## 2018-03-20 NOTE — Addendum Note (Signed)
Addended by: Denita Lung on: 03/20/2018 09:18 AM   Modules accepted: Orders

## 2018-03-23 ENCOUNTER — Other Ambulatory Visit: Payer: Self-pay | Admitting: Family Medicine

## 2018-03-24 ENCOUNTER — Ambulatory Visit (INDEPENDENT_AMBULATORY_CARE_PROVIDER_SITE_OTHER): Payer: 59 | Admitting: Physical Medicine and Rehabilitation

## 2018-03-24 ENCOUNTER — Encounter (INDEPENDENT_AMBULATORY_CARE_PROVIDER_SITE_OTHER): Payer: Self-pay | Admitting: Physical Medicine and Rehabilitation

## 2018-03-24 VITALS — BP 136/94 | HR 73 | Temp 97.7°F

## 2018-03-24 DIAGNOSIS — M47816 Spondylosis without myelopathy or radiculopathy, lumbar region: Secondary | ICD-10-CM | POA: Diagnosis not present

## 2018-03-24 DIAGNOSIS — M48061 Spinal stenosis, lumbar region without neurogenic claudication: Secondary | ICD-10-CM | POA: Diagnosis not present

## 2018-03-24 DIAGNOSIS — M545 Low back pain: Secondary | ICD-10-CM

## 2018-03-24 DIAGNOSIS — G8929 Other chronic pain: Secondary | ICD-10-CM

## 2018-03-24 MED ORDER — TRAMADOL HCL 50 MG PO TABS: 50.0000 mg | ORAL_TABLET | Freq: Four times a day (QID) | ORAL | 0 refills | Status: DC | PRN

## 2018-03-24 NOTE — Progress Notes (Signed)
 .  Numeric Pain Rating Scale and Functional Assessment Average Pain 4 Pain Right Now 5 My pain is constant and aching Pain is worse with: some activites Pain improves with: medication   In the last MONTH (on 0-10 scale) has pain interfered with the following?  1. General activity like being  able to carry out your everyday physical activities such as walking, climbing stairs, carrying groceries, or moving a chair?  Rating(7)  2. Relation with others like being able to carry out your usual social activities and roles such as  activities at home, at work and in your community. Rating(7)  3. Enjoyment of life such that you have  been bothered by emotional problems such as feeling anxious, depressed or irritable?  Rating(7)

## 2018-03-26 ENCOUNTER — Encounter: Payer: Self-pay | Admitting: Family Medicine

## 2018-04-01 ENCOUNTER — Encounter (INDEPENDENT_AMBULATORY_CARE_PROVIDER_SITE_OTHER): Payer: Self-pay | Admitting: Physical Medicine and Rehabilitation

## 2018-04-01 ENCOUNTER — Ambulatory Visit (INDEPENDENT_AMBULATORY_CARE_PROVIDER_SITE_OTHER): Payer: 59

## 2018-04-01 ENCOUNTER — Ambulatory Visit (INDEPENDENT_AMBULATORY_CARE_PROVIDER_SITE_OTHER): Payer: 59 | Admitting: Physical Medicine and Rehabilitation

## 2018-04-01 VITALS — BP 122/96 | HR 88 | Temp 97.9°F

## 2018-04-01 DIAGNOSIS — G8929 Other chronic pain: Secondary | ICD-10-CM | POA: Diagnosis not present

## 2018-04-01 DIAGNOSIS — M545 Low back pain: Secondary | ICD-10-CM

## 2018-04-01 DIAGNOSIS — M47816 Spondylosis without myelopathy or radiculopathy, lumbar region: Secondary | ICD-10-CM | POA: Diagnosis not present

## 2018-04-01 MED ORDER — METHYLPREDNISOLONE ACETATE 80 MG/ML IJ SUSP
80.0000 mg | Freq: Once | INTRAMUSCULAR | Status: AC
  Administered 2018-04-01: 80 mg

## 2018-04-01 NOTE — Patient Instructions (Signed)

## 2018-04-01 NOTE — Progress Notes (Signed)
 .  Numeric Pain Rating Scale and Functional Assessment Average Pain 5   In the last MONTH (on 0-10 scale) has pain interfered with the following?  1. General activity like being  able to carry out your everyday physical activities such as walking, climbing stairs, carrying groceries, or moving a chair?  Rating(3)   +Driver, -BT, -Dye Allergies.  

## 2018-04-07 ENCOUNTER — Encounter (INDEPENDENT_AMBULATORY_CARE_PROVIDER_SITE_OTHER): Payer: Self-pay | Admitting: Physical Medicine and Rehabilitation

## 2018-04-07 NOTE — Progress Notes (Signed)
Russell Peters - 52 y.o. male MRN [id number]  Date of birth: 12/12/1966  Office Visit Note: Visit Date: 03/24/2018 PCP: Denita Lung, MD Referred by: Denita Lung, MD  Subjective: Chief Complaint  Patient presents with  . Lower Back - Pain   HPI: Russell Peters is a 52 year old gentleman who comes in today at the request of [name], P.A.-C for evaluation and possible interventional spine procedure for his chronic low back pain.  He reports pain since September 2018 across the lower back worse with standing and ambulating ambulating.  He reports no radicular pain.  He reports some relief if he sits up in his recliner.  [name], P.A.-C has really had a pretty thorough evaluation of the patient and his can be reviewed for details an MRI was obtained by Dr. Trenton Gammon who we initially saw at [facility].  As shown in the notes and by the patient's report today he did see Dr. Brien Few and he completed 2 injections including an L4-5 interlaminar injection as well as a sacroiliac joint injection.  Patient again is having pain mainly at the low back area really higher than the sacroiliac joints.  No pain down the legs.  Pain with extension rotation of the lumbar spine.  MRI does show significant disc herniation in the lower thoracic region but without stenosis.  There is also multifactorial moderate stenosis at L3-4 and then mild to moderate facet arthropathy of the lower spine.  The injections by Dr. Brien Few were fairly recently over the end of last year.  He does not report much in the way of relief with that.  He is asking about some pain medication because of the severity of his symptoms.  His case is complicated by history of major depression as well as obese obesity and hypertension.  He has not had any bowel or bladder changes or red flag symptoms otherwise.  No focal weakness and again no radicular complaints per   Review of Systems  Constitutional: Negative for  chills, fever, malaise/fatigue and weight loss.  HENT: Negative for hearing loss and sinus pain.   Eyes: Negative for blurred vision, double vision and photophobia.  Respiratory: Negative for cough and shortness of breath.   Cardiovascular: Negative for chest pain, palpitations and leg swelling.  Gastrointestinal: Negative for abdominal pain, nausea and vomiting.  Genitourinary: Negative for flank pain.  Musculoskeletal: Positive for back pain. Negative for myalgias.  Skin: Negative for itching and rash.  Neurological: Negative for tremors, focal weakness and weakness.  Endo/Heme/Allergies: Negative.   Psychiatric/Behavioral: Negative for depression.  All other systems reviewed and are negative.  Otherwise per HPI.  Assessment & Plan: Visit Diagnoses:  1. Chronic bilateral low back pain without sciatica   2. Spondylosis without myelopathy or radiculopathy, lumbar region   3. Spinal stenosis of lumbar region without neurogenic claudication     Plan: Findings:  Chronic worsening axial low back pain that seems to be facet joint mediated pain even though on the MRI he does not have much in the way of a severe facet arthropathy.  He has pain across the lower back worse with extension and facet joint loading.  He is not have any pain consistent at least in my viewpoint of sacroiliitis or sacroiliac joint issues and diagnostic sacroiliac joint injection did not give him any relief.  This was performed by Dr. Dema Severin.  Patient did receive epidural injection at L4-5.  He does have moderate central canal stenosis  at L3-4.  His symptoms could be consistent with stenosis as well although is not getting any radicular complaints.  Lastly he does have a significant disc herniation in the lower thoracic region but his pain really is reported as a low back pain but we cannot rule that completely out as a source of pain.  At this point with my evaluation I think the best approach is bilateral L5-S1 facet joint  blocks diagnostically.  We could look at possible we could look at possible radiofrequency ablation if they were beneficial.  We discussed this at length with him today and spent over 25 minutes discussing his case and interventions and future outcomes with him.  He is still scheduled to follow-up with [name], P.A.-C which I think is wise.  I did prescribe a small amount of tramadol to see if it will help until we can get the injection performed.  We discussed that chronic opioids are probably not going to be an answer for him and at least in this case is not really warranted.    Meds & Orders:  Meds ordered this encounter  Medications  . DISCONTD: traMADol (ULTRAM) 50 MG tablet    Sig: Take 1 tablet (50 mg total) by mouth every 6 (six) hours as needed.    Dispense:  30 tablet    Refill:  0  . traMADol (ULTRAM) 50 MG tablet    Sig: Take 1 tablet (50 mg total) by mouth every 6 (six) hours as needed.    Dispense:  30 tablet    Refill:  0   No orders of the defined types were placed in this encounter.   Follow-up: Return for Bilateral L5-S1 facet joint injections.   Procedures: No procedures performed  No notes on file   Clinical History: MRI LUMBAR SPINE WITHOUT CONTRAST  TECHNIQUE: Multiplanar, multisequence MR imaging of the lumbar spine was performed. No intravenous contrast was administered.  COMPARISON:  None.  FINDINGS: Segmentation:  Standard  Alignment: Mild retrolisthesis L2-3. Mild anterolisthesis L4-5 and L5-S1.  Vertebrae:  Negative for fracture or mass  Conus medullaris and cauda equina: Conus extends to the L2 level. Conus and cauda equina appear normal.  Paraspinal and other soft tissues: Negative for mass or adenopathy  Disc levels:  T12-L1: Moderately large central disc protrusion and spurring with cord flattening and mild spinal stenosis  L1-2: Disc degeneration with Schmorl's node. Mild disc bulging and spurring without  stenosis  L2-3: Broad-based central disc protrusion to the right of midline causing mild spinal stenosis.  L3-4: Disc degeneration with disc bulging and diffuse endplate spurring. Mild facet and ligamentum flavum hypertrophy. Moderate spinal stenosis. Moderate subarticular stenosis on the right.  L4-5: Disc degeneration with diffuse disc bulging and endplate spurring. Mild facet degeneration and mild spinal stenosis. Mild right foraminal narrowing  L5-S1: Small central disc protrusion and spurring. Mild disc and facet degeneration. Mild subarticular stenosis.  IMPRESSION: Mild spinal stenosis T12-L1 due disc protrusion and spurring  Mild spinal stenosis L2-3  Moderate spinal stenosis L3-4 with right subarticular stenosis  Mild spinal stenosis L4-5 with mild right foraminal narrowing  Small central disc protrusion L5-S1 with mild subarticular stenosis.   Electronically Signed   By: Franchot Gallo M.D.   On: 11/19/2017 10:28   He reports that he quit smoking about 19 years ago. He has never used smokeless tobacco. No results for input(s): HGBA1C, LABURIC in the last 8760 hours.  Objective:  VS:  HT:    WT:  BMI:     BP:(!) 136/94  HR:73bpm  TEMP:97.7 F (36.5 C)(Oral)  RESP:100 % Physical Exam  Constitutional: He is oriented to person, place, and time. He appears well-developed and well-nourished. No distress.  HENT:  Head: Normocephalic and atraumatic.  Nose: Nose normal.  Mouth/Throat: Oropharynx is clear and moist.  Eyes: Pupils are equal, round, and reactive to light. Conjunctivae are normal.  Neck: Normal range of motion. Neck supple. No tracheal deviation present.  Cardiovascular: Regular rhythm and intact distal pulses.  Pulmonary/Chest: Effort normal and breath sounds normal.  Abdominal: Soft. He exhibits no distension. There is no rebound and no guarding.  Musculoskeletal: He exhibits no deformity.  Concordant low back pain with extension  rotation and facet joint loading of the lumbar spine.  No pain with hip rotation.  No real pain over the PSIS.  He has good distal strength without clonus.  He does have some focal tight muscle bands in the quadratus lumborum area.  Neurological: He is alert and oriented to person, place, and time. He exhibits normal muscle tone. Coordination normal.  Skin: Skin is warm. No rash noted.  Psychiatric: He has a normal mood and affect. His behavior is normal.  Nursing note and vitals reviewed.   Ortho Exam Imaging: No results found.  Past Medical/Family/Surgical/Social History: Medications & Allergies reviewed per EMR, new medications updated. Patient Active Problem List   Diagnosis Date Noted  . Attention deficit disorder 02/04/2017  . Gastroesophageal reflux disease 02/04/2017  . Family history of heart disease in male family member before age 90 10/12/2015  . Major depressive disorder, recurrent, severe without psychotic features ([place])   . Hypothyroid 05/16/2011  . Hypertension 05/16/2011  . Obesity 05/16/2011   Past Medical History:  Diagnosis Date  . ADD (attention deficit disorder)   . Allergy   . Depression   . GERD (gastroesophageal reflux disease)   . Hypertension   . Sleep apnea   . Thyroid disease    History reviewed. No pertinent family history. Past Surgical History:  Procedure Laterality Date  . NO PAST SURGERIES     Social History   Occupational History  . Not on file  Tobacco Use  . Smoking status: Former Smoker    Last attempt to quit: 12/23/1998    Years since quitting: 19.3  . Smokeless tobacco: Never Used  Substance and Sexual Activity  . Alcohol use: Yes    Comment: 3 beers weekly in past 8 months; 1 pt weekly liquor  . Drug use: No  . Sexual activity: Never

## 2018-04-07 NOTE — Progress Notes (Signed)
Russell Peters - 52 y.o. male MRN [id number]  Date of birth: 08-03-1966  Office Visit Note: Visit Date: 04/01/2018 PCP: Russell Lung, MD Referred by: Russell Lung, MD  Subjective: Chief Complaint  Patient presents with  . Lower Back - Pain   HPI: Russell Peters is a 52 year old gentleman who we recently saw for evaluation and management and he follows with Russell Peters in our office.  He comes in today for planned bilateral L5-S1 facet joint blocks for his chronic worsening low back pain which is been progressive since 2018.  Please see our prior evaluation and management note for further details and justification.   ROS Otherwise per HPI.  Assessment & Plan: Visit Diagnoses:  1. Spondylosis without myelopathy or radiculopathy, lumbar region   2. Chronic bilateral low back pain without sciatica     Plan: No additional findings.   Meds & Orders:  Meds ordered this encounter  Medications  . methylPREDNISolone acetate (DEPO-MEDROL) injection 80 mg    Orders Placed This Encounter  Procedures  . Facet Injection  . XR C-ARM NO REPORT    Follow-up: Return if symptoms worsen or fail to improve.   Procedures: No procedures performed  Lumbar Diagnostic Facet Joint Nerve Block with Fluoroscopic Guidance   Patient: Russell Peters      Date of Birth: 1966-09-21 MRN: [id number] PCP: Russell Lung, MD      Visit Date: 04/01/2018   Universal Protocol:    Date/Time: 04/16/196:14 AM  Consent Given By: the patient  Position: PRONE  Additional Comments: Vital signs were monitored before and after the procedure. Patient was prepped and draped in the usual sterile fashion. The correct patient, procedure, and site was verified.   Injection Procedure Details:  Procedure Site One Meds Administered:  Meds ordered this encounter  Medications  . methylPREDNISolone acetate (DEPO-MEDROL) injection 80 mg     Laterality: Bilateral  Location/Site:  L5-S1  Needle size: 22  ga.  Needle type:spinal  Needle Placement: Oblique pedical  Findings:   -Comments: There was excellent flow of contrast along the articular pillars without intravascular flow.  Procedure Details: The fluoroscope beam is vertically oriented in AP and then obliqued 15 to 20 degrees to the ipsilateral side of the desired nerve to achieve the "Scotty dog" appearance.  The skin over the target area of the junction of the superior articulating process and the transverse process (sacral ala if blocking the L5 dorsal rami) was locally anesthetized with a 1 ml volume of 1% Lidocaine without Epinephrine.  The spinal needle was inserted and advanced in a trajectory view down to the target.   After contact with periosteum and negative aspirate for blood and CSF, correct placement without intravascular or epidural spread was confirmed by injecting 0.5 ml. of Isovue-250.  A spot radiograph was obtained of this image.    Next, a 0.5 ml. volume of the injectate described above was injected. The needle was then redirected to the other facet joint nerves mentioned above if needed.  Prior to the procedure, the patient was given a Pain Diary which was completed for baseline measurements.  After the procedure, the patient rated their pain every 30 minutes and will continue rating at this frequency for a total of 5 hours.  The patient has been asked to complete the Diary and return to [place] by mail, fax or hand delivered as soon as possible.   Additional Comments:  The patient tolerated the procedure well Dressing: Band-Aid  Post-procedure details: Patient was observed during the procedure. Post-procedure instructions were reviewed.  Patient left the clinic in stable condition.   Clinical History: MRI LUMBAR SPINE WITHOUT CONTRAST  TECHNIQUE: Multiplanar, multisequence MR imaging of the lumbar spine was performed. No intravenous contrast was administered.  COMPARISON:   None.  FINDINGS: Segmentation:  Standard  Alignment: Mild retrolisthesis L2-3. Mild anterolisthesis L4-5 and L5-S1.  Vertebrae:  Negative for fracture or mass  Conus medullaris and cauda equina: Conus extends to the L2 level. Conus and cauda equina appear normal.  Paraspinal and other soft tissues: Negative for mass or adenopathy  Disc levels:  T12-L1: Moderately large central disc protrusion and spurring with cord flattening and mild spinal stenosis  L1-2: Disc degeneration with Schmorl's node. Mild disc bulging and spurring without stenosis  L2-3: Broad-based central disc protrusion to the right of midline causing mild spinal stenosis.  L3-4: Disc degeneration with disc bulging and diffuse endplate spurring. Mild facet and ligamentum flavum hypertrophy. Moderate spinal stenosis. Moderate subarticular stenosis on the right.  L4-5: Disc degeneration with diffuse disc bulging and endplate spurring. Mild facet degeneration and mild spinal stenosis. Mild right foraminal narrowing  L5-S1: Small central disc protrusion and spurring. Mild disc and facet degeneration. Mild subarticular stenosis.  IMPRESSION: Mild spinal stenosis T12-L1 due disc protrusion and spurring  Mild spinal stenosis L2-3  Moderate spinal stenosis L3-4 with right subarticular stenosis  Mild spinal stenosis L4-5 with mild right foraminal narrowing  Small central disc protrusion L5-S1 with mild subarticular stenosis.   Electronically Signed   By: Russell Peters M.D.   On: 11/19/2017 10:28   He reports that he quit smoking about 19 years ago. He has never used smokeless tobacco. No results for input(s): HGBA1C, LABURIC in the last 8760 hours.  Objective:  VS:  HT:    WT:   BMI:     BP:(!) 122/96  HR:88bpm  TEMP:97.9 F (36.6 C)(Oral)  RESP:98 % Physical Exam  Musculoskeletal:  Unchanged.  Good distal strength bilaterally.    Ortho Exam Imaging: No results  found.  Past Medical/Family/Surgical/Social History: Medications & Allergies reviewed per EMR, new medications updated. Patient Active Problem List   Diagnosis Date Noted  . Attention deficit disorder 02/04/2017  . Gastroesophageal reflux disease 02/04/2017  . Family history of heart disease in male family member before age 81 10/12/2015  . Major depressive disorder, recurrent, severe without psychotic features ([place])   . Hypothyroid 05/16/2011  . Hypertension 05/16/2011  . Obesity 05/16/2011   Past Medical History:  Diagnosis Date  . ADD (attention deficit disorder)   . Allergy   . Depression   . GERD (gastroesophageal reflux disease)   . Hypertension   . Sleep apnea   . Thyroid disease    History reviewed. No pertinent family history. Past Surgical History:  Procedure Laterality Date  . NO PAST SURGERIES     Social History   Occupational History  . Not on file  Tobacco Use  . Smoking status: Former Smoker    Last attempt to quit: 12/23/1998    Years since quitting: 19.3  . Smokeless tobacco: Never Used  Substance and Sexual Activity  . Alcohol use: Yes    Comment: 3 beers weekly in past 8 months; 1 pt weekly liquor  . Drug use: No  . Sexual activity: Never

## 2018-04-07 NOTE — Procedures (Signed)
Lumbar Diagnostic Facet Joint Nerve Block with Fluoroscopic Guidance   Patient: Russell Peters      Date of Birth: [birth year] MRN: [id number] PCP: Denita Lung, MD      Visit Date: 04/01/2018   Universal Protocol:    Date/Time: 04/16/196:14 AM  Consent Given By: the patient  Position: PRONE  Additional Comments: Vital signs were monitored before and after the procedure. Patient was prepped and draped in the usual sterile fashion. The correct patient, procedure, and site was verified.   Injection Procedure Details:  Procedure Site One Meds Administered:  Meds ordered this encounter  Medications  . methylPREDNISolone acetate (DEPO-MEDROL) injection 80 mg     Laterality: Bilateral  Location/Site:  L5-S1  Needle size: 22 ga.  Needle type:spinal  Needle Placement: Oblique pedical  Findings:   -Comments: There was excellent flow of contrast along the articular pillars without intravascular flow.  Procedure Details: The fluoroscope beam is vertically oriented in AP and then obliqued 15 to 20 degrees to the ipsilateral side of the desired nerve to achieve the "Scotty dog" appearance.  The skin over the target area of the junction of the superior articulating process and the transverse process (sacral ala if blocking the L5 dorsal rami) was locally anesthetized with a 1 ml volume of 1% Lidocaine without Epinephrine.  The spinal needle was inserted and advanced in a trajectory view down to the target.   After contact with periosteum and negative aspirate for blood and CSF, correct placement without intravascular or epidural spread was confirmed by injecting 0.5 ml. of Isovue-250.  A spot radiograph was obtained of this image.    Next, a 0.5 ml. volume of the injectate described above was injected. The needle was then redirected to the other facet joint nerves mentioned above if needed.  Prior to the procedure, the patient was given a Pain Diary which was completed for  baseline measurements.  After the procedure, the patient rated their pain every 30 minutes and will continue rating at this frequency for a total of 5 hours.  The patient has been asked to complete the Diary and return to [place] by mail, fax or hand delivered as soon as possible.   Additional Comments:  The patient tolerated the procedure well Dressing: Band-Aid    Post-procedure details: Patient was observed during the procedure. Post-procedure instructions were reviewed.  Patient left the clinic in stable condition.

## 2018-05-04 ENCOUNTER — Encounter: Payer: Self-pay | Admitting: Family Medicine

## 2018-05-06 ENCOUNTER — Other Ambulatory Visit: Payer: Self-pay | Admitting: Family Medicine

## 2018-05-29 ENCOUNTER — Telehealth: Payer: Self-pay

## 2018-05-29 NOTE — Telephone Encounter (Signed)
Pt was advised of cologuard. He says he will send in . Pt is asking about a letter for lie insurance. Please advise

## 2018-05-29 NOTE — Telephone Encounter (Signed)
Got the impression that pt did not want to talk to me about an issue that took place while pt was trying to get life insurance. Please call pt . Thanks [employer]

## 2018-05-29 NOTE — Telephone Encounter (Signed)
Let him know that I forgot about the letter and get more information so I can write it

## 2018-06-02 ENCOUNTER — Encounter: Payer: Self-pay | Admitting: Family Medicine

## 2018-06-02 ENCOUNTER — Telehealth: Payer: Self-pay | Admitting: Family Medicine

## 2018-06-02 NOTE — Telephone Encounter (Signed)
Called pt and advised letter typed and ready for pick up

## 2018-06-03 ENCOUNTER — Other Ambulatory Visit: Payer: Self-pay | Admitting: Family Medicine

## 2018-06-16 DIAGNOSIS — Z1211 Encounter for screening for malignant neoplasm of colon: Secondary | ICD-10-CM | POA: Diagnosis not present

## 2018-06-18 LAB — COLOGUARD: COLOGUARD: NEGATIVE

## 2018-06-26 ENCOUNTER — Encounter: Payer: Self-pay | Admitting: Family Medicine

## 2018-06-29 DIAGNOSIS — Z79899 Other long term (current) drug therapy: Secondary | ICD-10-CM | POA: Diagnosis not present

## 2018-06-30 ENCOUNTER — Telehealth: Payer: Self-pay

## 2018-06-30 NOTE — Telephone Encounter (Signed)
Pt advised of cologuard results are negative. [place]

## 2018-08-07 DIAGNOSIS — Z79899 Other long term (current) drug therapy: Secondary | ICD-10-CM | POA: Diagnosis not present

## 2018-09-09 ENCOUNTER — Other Ambulatory Visit: Payer: Self-pay | Admitting: Family Medicine

## 2018-12-06 ENCOUNTER — Other Ambulatory Visit: Payer: Self-pay | Admitting: Family Medicine

## 2019-03-04 ENCOUNTER — Other Ambulatory Visit: Payer: Self-pay | Admitting: *Deleted

## 2019-03-04 MED ORDER — LEVOTHYROXINE SODIUM 88 MCG PO TABS: 88.0000 ug | ORAL_TABLET | Freq: Every day | ORAL | 0 refills | Status: DC

## 2019-03-07 ENCOUNTER — Other Ambulatory Visit: Payer: Self-pay | Admitting: Family Medicine

## 2019-03-07 DIAGNOSIS — I10 Essential (primary) hypertension: Secondary | ICD-10-CM

## 2019-03-11 ENCOUNTER — Ambulatory Visit: Payer: 59 | Admitting: Family Medicine

## 2019-03-11 ENCOUNTER — Encounter: Payer: Self-pay | Admitting: Family Medicine

## 2019-03-11 ENCOUNTER — Other Ambulatory Visit: Payer: Self-pay

## 2019-03-11 VITALS — BP 120/82 | HR 98 | Temp 98.0°F | Wt 253.0 lb

## 2019-03-11 DIAGNOSIS — Z8249 Family history of ischemic heart disease and other diseases of the circulatory system: Secondary | ICD-10-CM | POA: Diagnosis not present

## 2019-03-11 DIAGNOSIS — R531 Weakness: Secondary | ICD-10-CM

## 2019-03-11 DIAGNOSIS — F325 Major depressive disorder, single episode, in full remission: Secondary | ICD-10-CM

## 2019-03-11 DIAGNOSIS — Z6837 Body mass index (BMI) 37.0-37.9, adult: Secondary | ICD-10-CM

## 2019-03-11 DIAGNOSIS — F988 Other specified behavioral and emotional disorders with onset usually occurring in childhood and adolescence: Secondary | ICD-10-CM | POA: Diagnosis not present

## 2019-03-11 DIAGNOSIS — E6609 Other obesity due to excess calories: Secondary | ICD-10-CM

## 2019-03-11 DIAGNOSIS — I10 Essential (primary) hypertension: Secondary | ICD-10-CM | POA: Diagnosis not present

## 2019-03-11 DIAGNOSIS — E039 Hypothyroidism, unspecified: Secondary | ICD-10-CM | POA: Diagnosis not present

## 2019-03-11 DIAGNOSIS — K219 Gastro-esophageal reflux disease without esophagitis: Secondary | ICD-10-CM

## 2019-03-11 NOTE — Progress Notes (Signed)
   Subjective:    Patient ID: Russell Peters, male    DOB: 1966-12-22, 53 y.o.   MRN: [id number]  HPI He is here for an interval evaluation.  He does have underlying hypothyroid and continues on his medication.  He does state that he has noted a decrease in his strength but seems to have good libido and stamina.  He does have underlying allergies and is taking Claritin with good relief of that.  His reflux seems to be under good control on his present medication regimen.  He notes that if he goes more than 3 days he will again have symptoms.  He is using his CPAP but has not had a readout in quite some time.  He does have underlying ADD and sees [name] every 6 months.  He was also seen her in the past for depression but seems to doing quite nicely not having any difficulty with that.  He is a former smoker.  He did have some facet injections in his back and has gotten good relief of his symptoms.  He has had colon cancer screening.   Review of Systems     Objective:   Physical Exam Alert and in no distress. Tympanic membranes and canals are normal. Pharyngeal area is normal. Neck is supple without adenopathy or thyromegaly. Cardiac exam shows a regular sinus rhythm without murmurs or gallops. Lungs are clear to auscultation.        Assessment & Plan:  Hypothyroidism, unspecified type - Plan: TSH  Attention deficit disorder, unspecified hyperactivity presence  Family history of heart disease in male family member before age 64 - Plan: CBC with Differential/Platelet, Comprehensive metabolic panel, Lipid panel  Gastroesophageal reflux disease, esophagitis presence not specified  Essential hypertension - Plan: CBC with Differential/Platelet, Comprehensive metabolic panel  Class 2 obesity due to excess calories without serious comorbidity with body mass index (BMI) of 37.0 to 37.9 in adult  Depression, major, in remission (HCC)  Weakness - Plan: Testosterone, TSH Will continue on his  present medications.  Discussed the possibility of me giving him his ADD medication since he seems to be doing well.  He will consider that. Did recommend he cut back on his PPI to taking it every 2 days since that seems to be the best that works for him. I will follow-up on the weakness with thyroid as well as testosterone. He is to get a CPAP read out to make sure that he is getting adequate response. Continue on his allergy medications.

## 2019-03-12 LAB — TESTOSTERONE: Testosterone: 703 ng/dL (ref 264–916)

## 2019-03-12 LAB — LIPID PANEL
CHOL/HDL RATIO: 5.4 ratio — AB (ref 0.0–5.0)
Cholesterol, Total: 221 mg/dL — ABNORMAL HIGH (ref 100–199)
HDL: 41 mg/dL (ref >39)
LDL Calculated: 165 mg/dL — ABNORMAL HIGH (ref 0–99)
Triglycerides: 75 mg/dL (ref 0–149)
VLDL Cholesterol Cal: 15 mg/dL (ref 5–40)

## 2019-03-12 LAB — CBC WITH DIFFERENTIAL/PLATELET
Basophils Absolute: 0 10*3/uL (ref 0.0–0.2)
Basos: 1 %
EOS (ABSOLUTE): 0.2 10*3/uL (ref 0.0–0.4)
Eos: 3 %
Hematocrit: 45.9 % (ref 37.5–51.0)
Hemoglobin: 15.6 g/dL (ref 13.0–17.7)
IMMATURE GRANULOCYTES: 0 %
Immature Grans (Abs): 0 10*3/uL (ref 0.0–0.1)
Lymphocytes Absolute: 2.3 10*3/uL (ref 0.7–3.1)
Lymphs: 32 %
MCH: 30.2 pg (ref 26.6–33.0)
MCHC: 34 g/dL (ref 31.5–35.7)
MCV: 89 fL (ref 79–97)
Monocytes Absolute: 0.8 10*3/uL (ref 0.1–0.9)
Monocytes: 11 %
Neutrophils Absolute: 4 10*3/uL (ref 1.4–7.0)
Neutrophils: 53 %
PLATELETS: 288 10*3/uL (ref 150–450)
RBC: 5.16 x10E6/uL (ref 4.14–5.80)
RDW: 12.3 % (ref 11.6–15.4)
WBC: 7.4 10*3/uL (ref 3.4–10.8)

## 2019-03-12 LAB — COMPREHENSIVE METABOLIC PANEL
ALT: 24 IU/L (ref 0–44)
AST: 18 IU/L (ref 0–40)
Albumin/Globulin Ratio: 2.2 (ref 1.2–2.2)
Albumin: 4.4 g/dL (ref 3.8–4.9)
Alkaline Phosphatase: 59 IU/L (ref 39–117)
BUN/Creatinine Ratio: 12 (ref 9–20)
BUN: 14 mg/dL (ref 6–24)
Bilirubin Total: 1.1 mg/dL (ref 0.0–1.2)
CALCIUM: 9.8 mg/dL (ref 8.7–10.2)
CO2: 24 mmol/L (ref 20–29)
Chloride: 103 mmol/L (ref 96–106)
Creatinine, Ser: 1.15 mg/dL (ref 0.76–1.27)
GFR calc Af Amer: 85 mL/min/{1.73_m2} (ref >59)
GFR calc non Af Amer: 73 mL/min/{1.73_m2} (ref >59)
Globulin, Total: 2 g/dL (ref 1.5–4.5)
Glucose: 120 mg/dL — ABNORMAL HIGH (ref 65–99)
POTASSIUM: 4.9 mmol/L (ref 3.5–5.2)
Sodium: 139 mmol/L (ref 134–144)
Total Protein: 6.4 g/dL (ref 6.0–8.5)

## 2019-03-12 LAB — TSH: TSH: 3.17 u[IU]/mL (ref 0.450–4.500)

## 2019-03-12 MED ORDER — LISINOPRIL-HYDROCHLOROTHIAZIDE 10-12.5 MG PO TABS: 1.0000 | ORAL_TABLET | Freq: Every day | ORAL | 3 refills | Status: DC

## 2019-03-12 MED ORDER — SYNTHROID 88 MCG PO TABS: 88.0000 ug | ORAL_TABLET | Freq: Every day | ORAL | 3 refills | Status: DC

## 2019-03-12 NOTE — Addendum Note (Signed)
Addended by: Denita Lung on: 03/12/2019 07:57 AM   Modules accepted: Orders

## 2019-05-26 ENCOUNTER — Other Ambulatory Visit: Payer: Self-pay | Admitting: Family Medicine

## 2019-05-26 DIAGNOSIS — E039 Hypothyroidism, unspecified: Secondary | ICD-10-CM

## 2019-08-19 ENCOUNTER — Telehealth: Payer: Self-pay | Admitting: *Deleted

## 2019-08-19 NOTE — Telephone Encounter (Signed)
Would be okay to have him do an office visit and inject the L5-S1 facet joints at the same time if possible.  We would evaluate his "mid back pain "at that time.

## 2019-08-19 NOTE — Telephone Encounter (Signed)
Please make sure to reschedule his current appointment if we are doing an injection as it is on a Tuesday morning.

## 2019-08-20 NOTE — Telephone Encounter (Signed)
Pt states he would like to discuss first prior to scheduling an injection.

## 2019-08-23 ENCOUNTER — Other Ambulatory Visit: Payer: Self-pay | Admitting: Family Medicine

## 2019-08-23 DIAGNOSIS — E039 Hypothyroidism, unspecified: Secondary | ICD-10-CM

## 2019-08-31 ENCOUNTER — Ambulatory Visit: Payer: Self-pay

## 2019-08-31 ENCOUNTER — Ambulatory Visit (INDEPENDENT_AMBULATORY_CARE_PROVIDER_SITE_OTHER): Payer: 59 | Admitting: Physical Medicine and Rehabilitation

## 2019-08-31 ENCOUNTER — Other Ambulatory Visit: Payer: Self-pay

## 2019-08-31 ENCOUNTER — Encounter: Payer: Self-pay | Admitting: Physical Medicine and Rehabilitation

## 2019-08-31 VITALS — BP 174/116 | HR 115

## 2019-08-31 DIAGNOSIS — G8929 Other chronic pain: Secondary | ICD-10-CM

## 2019-08-31 DIAGNOSIS — M5412 Radiculopathy, cervical region: Secondary | ICD-10-CM | POA: Diagnosis not present

## 2019-08-31 DIAGNOSIS — R202 Paresthesia of skin: Secondary | ICD-10-CM | POA: Diagnosis not present

## 2019-08-31 DIAGNOSIS — M25512 Pain in left shoulder: Secondary | ICD-10-CM

## 2019-08-31 DIAGNOSIS — G5622 Lesion of ulnar nerve, left upper limb: Secondary | ICD-10-CM

## 2019-08-31 NOTE — Progress Notes (Signed)
  Numeric Pain Rating Scale and Functional Assessment Average Pain 5 Pain Right Now 5 My pain is dull, tingling and aching Pain is worse with: some activites Pain improves with: medication   In the last MONTH (on 0-10 scale) has pain interfered with the following?  1. General activity like being  able to carry out your everyday physical activities such as walking, climbing stairs, carrying groceries, or moving a chair?  Rating(6)  2. Relation with others like being able to carry out your usual social activities and roles such as  activities at home, at work and in your community. Rating(6)  3. Enjoyment of life such that you have  been bothered by emotional problems such as feeling anxious, depressed or irritable?  Rating(1)

## 2019-09-01 ENCOUNTER — Encounter: Payer: Self-pay | Admitting: Physical Medicine and Rehabilitation

## 2019-09-01 DIAGNOSIS — M25512 Pain in left shoulder: Secondary | ICD-10-CM | POA: Diagnosis not present

## 2019-09-01 DIAGNOSIS — G8929 Other chronic pain: Secondary | ICD-10-CM

## 2019-09-01 MED ORDER — BUPIVACAINE HCL 0.5 % IJ SOLN
3.0000 mL | INTRAMUSCULAR | Status: AC | PRN
  Administered 2019-09-01: 3 mL via INTRA_ARTICULAR

## 2019-09-01 MED ORDER — TRIAMCINOLONE ACETONIDE 40 MG/ML IJ SUSP
60.0000 mg | INTRAMUSCULAR | Status: AC | PRN
  Administered 2019-09-01: 06:00:00 60 mg via INTRA_ARTICULAR

## 2019-09-01 NOTE — Progress Notes (Addendum)
Russell Peters - 53 y.o. male MRN [id number]  Date of birth: [birth year]  Office Visit Note: Visit Date: 08/31/2019 PCP: Denita Lung, MD Referred by: Denita Lung, MD  Subjective: Chief Complaint  Patient presents with   Left Hand - Numbness, Weakness   Left Shoulder - Pain   Left Arm - Pain, Numbness   HPI: Russell Peters is a 53 y.o. male who comes in today For evaluation and management of chronic 6 months of left arm pain particularly in the shoulder and upper arm but also with numbness and tingling in the fourth and fifth digits but sometimes globally.  By way of quick review we saw the patient in April of last year at the request of [name], PA-C who had evaluated him for chronic worsening low back pain.  His history is that he had seen Dr. Deri Fuelling at [facility] as well as Dr. Brien Few.  He had injections performed there that were not very effective.  We did complete bilateral on there that were not lateral L5-S1 facet joint injections with decent relief last year we have not seen him since that time.  He is also followed by his primary care physician Dr. Redmond School.  He reports pain really in the top and posterior left shoulder which can refer sometimes down into the lateral upper arm.  This is worse with movement and abduction and reaching over the center of his chest.  He has had a history of prior ACDF which is remote and occurred sometime around 2013.  This appears to be at the level of C5-6 and C6-7.  This was for more right-sided radicular pain.  He says unlike when that happened he did only get relief at that time by putting his right hand over his head.  He reports this is the reverse now he has trouble lifting his left shoulder without considerable pain.  He endorses numbness and tingling in the left ulnar nerve distribution possibly C8 distribution.  He does have pain at the elbow.  He has not noted any particular weakness with grip.  He  is right-hand dominant.  He does report worsening with typing at work and sleeping on the left side.  He is tried to get some relief with Goody's headache powder.  He is tried heat but nothing else seems to help.  He reports this is a dull tingling and aching pain which is 5 out of 5 and intensity.  His history is complicated by depression and obesity and he has had opioid medications on occasion over the past chronic pain timeframe.  He is also treated for ADHD and is on those medications as well.  Review of Systems  Constitutional: Negative for chills, fever, malaise/fatigue and weight loss.  HENT: Negative for hearing loss and sinus pain.   Eyes: Negative for blurred vision, double vision and photophobia.  Respiratory: Negative for cough and shortness of breath.   Cardiovascular: Negative for chest pain, palpitations and leg swelling.  Gastrointestinal: Negative for abdominal pain, nausea and vomiting.  Genitourinary: Negative for flank pain.  Musculoskeletal: Positive for back pain, joint pain and neck pain. Negative for myalgias.  Skin: Negative for itching and rash.  Neurological: Positive for tingling. Negative for tremors, focal weakness and weakness.  Endo/Heme/Allergies: Negative.   Psychiatric/Behavioral: Negative for depression.  All other systems reviewed and are negative.  Otherwise per HPI.  Assessment & Plan: Visit Diagnoses:  1. 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 Paresthesia of skin   4. Cervical radiculopathy     Plan: Findings:  1.  Chronic worsening severe at times left shoulder pain which seems to be mechanical worse with abduction and flexion across the chest.  No pain around the [facility] joint and x-ray today pretty unrevealing for any arthritic changes.  He does not have any signs of significant rotator cuff tear but he is having pain with movement and trying to raise the arm.  We did complete diagnostic and hopefully therapeutic  intra-articular shoulder injection today today and he did get relief during the anesthetic phase.  Is.  We did give him exercises to do for her shoulder.  Depending on relief with that would look at focused physical therapy for his shoulder or possibly referral back to [name], P.A.-C for more management.  Management.  Conversely if we really felt like this was radicular in nature I would look at repeat MRI of the cervical spine.  He has not had any cervical spine advanced imaging since his cervical surgery many years ago.  X-ray today of the cervical spine showed increased kyphosis above the fusion fusion as well as anterior spurring and facet arthropathy but no other significant findings.  2.  Tingling pain and numbness in the left hand particularly in the fourth and fifth digit.  Clinical exam consistent more with ulnar neuropathy of the left elbow.  He does report at work when he types he is standing and he is resting his arms on the table.  At this point is going to try to change positions with typing but any use topical over-the-counter Voltaren gel and see how he does with symptoms of the left hand.  Depending on relief of that would look at electrodiagnostic study of the left upper limb.    Meds & Orders:  Meds ordered this encounter  Medications   bupivacaine (MARCAINE) 0.5 % (with pres) injection 3 mL   triamcinolone acetonide (KENALOG-40) injection 60 mg    Orders Placed This Encounter  Procedures   Large Joint Inj: L glenohumeral   XR Cervical Spine 2 or 3 views   XR Shoulder Left   XR C-ARM NO REPORT    Follow-up: Return if symptoms worsen or fail to improve.   Procedures: Large Joint Inj: L glenohumeral on 09/01/2019 5:43 AM Indications: pain and diagnostic evaluation Details: 22 G 3.5 in needle, fluoroscopy-guided anteromedial approach  Arthrogram: No  Medications: 3 mL bupivacaine 0.5 %; 60 mg triamcinolone acetonide 40 MG/ML Outcome: tolerated well, no immediate  complications  There was excellent flow of contrast producing a partial arthrogram of the glenohumeral joint. The patient did have relief of symptoms during the anesthetic phase of the injection. Procedure, treatment alternatives, risks and benefits explained, specific risks discussed. Consent was given by the patient. Immediately prior to procedure a time out was called to verify the correct patient, procedure, equipment, support staff and site/side marked as required. Patient was prepped and draped in the usual sterile fashion.      No notes on file   Clinical History: MRI CERVICAL SPINE WITHOUT CONTRAST  Technique:  Multiplanar and multiecho pulse sequences of the cervical spine, to include the craniocervical junction and cervicothoracic junction, were obtained according to standard protocol without intravenous contrast.  Comparison: CT 01/30/2011  Findings: Patient motion degrades image quality.  Normal alignment with straightening of the cervical lordosis. Negative for fracture or mass lesion.  Spinal  cord is difficult to evaluate due to motion but no definite cord edema is present.  C2-3:  Mild disc degeneration.  C3-4:  Mild disc degeneration.  C4-5:  Disc degeneration with disc bulging and a small central disc protrusion.  No cord deformity or stenosis.  C5-6:  Bilateral disc protrusions are present, right greater than left.  There is flattening of the cord on the right due to disc protrusion.  This causes mild to moderate spinal stenosis.  Mild associated spurring is present.  C6-7:  Shallow broad-based disc protrusion centrally with associated uncinate spurring.  No significant stenosis.  C7-T1:  Negative  IMPRESSION: Bilateral disc protrusions C5-6, right greater than left.  This is causing some cord deformity on the right and moderate spinal stenosis.  Shallow disc protrusions C6-7 with associated spondylosis.  Original Report Authenticated  By: Truett Perna, M.D.   He reports that he quit smoking about 20 years ago. He has never used smokeless tobacco. No results for input(s): HGBA1C, LABURIC in the last 8760 hours.  Objective:  VS:  HT:     WT:    BMI:      BP:(!) 174/116   HR:(!) 115bpm   TEMP: ( )   RESP:  Physical Exam Vitals signs and nursing note reviewed.  Constitutional:      General: He is not in acute distress.    Appearance: He is well-developed. He is obese.  HENT:     Head: Normocephalic and atraumatic.     Nose: Nose normal.     Mouth/Throat:     Mouth: Mucous membranes are moist.     Pharynx: Oropharynx is clear.  Eyes:     Conjunctiva/sclera: Conjunctivae normal.     Pupils: Pupils are equal, round, and reactive to light.  Neck:     Musculoskeletal: Normal range of motion and neck supple. No neck rigidity or muscular tenderness.     Trachea: No tracheal deviation.  Cardiovascular:     Rate and Rhythm: Normal rate and regular rhythm.     Pulses: Normal pulses.  Pulmonary:     Effort: Pulmonary effort is normal.     Breath sounds: Normal breath sounds.  Abdominal:     General: There is no distension.     Palpations: Abdomen is soft.     Tenderness: There is no guarding or rebound.  Musculoskeletal:        General: No deformity.     Right lower leg: No edema.     Left lower leg: No edema.     Comments: Examination of the cervical spine shows well-healed anterior surgical scar with somewhat forward flexed cervical spine.  He has some pain at end ranges of rotation negative Spurling's test.  Some trigger point type taut bands in the trapezius but these do not reproduce his pain.  He has significant pain on the left with external rotation as well as O'Brien's testing.  He has a negative drop arm test.  He does have trouble with abduction.  He has good strength in the upper extremities bilaterally.  He has decreased sensation in an ulnar nerve distribution on the left with a positive Tinel's over the left  ulnar groove.  He has a negative Wartenberg sign negative Benedictine sign and no clawing.  No atrophy.  Skin:    General: Skin is warm and dry.     Findings: No erythema or rash.  Neurological:     General: No focal deficit present.  Mental Status: He is alert and oriented to person, place, and time.     Motor: No abnormal muscle tone.     Coordination: Coordination normal.     Gait: Gait normal.  Psychiatric:        Mood and Affect: Mood normal.        Behavior: Behavior normal.        Thought Content: Thought content normal.     Ortho Exam Imaging: Xr C-arm No Report  Result Date: 08/31/2019 Please see Notes tab for imaging impression.  Xr Cervical Spine 2 Or 3 Views  Result Date: 09/01/2019 AP and lateral cervical spine shows prior ACDF at C5-6 and C6-7 with increased kyphosis at the level above the fusion with some anterior spurring at C3-4 and C4-5 and upper cervical spondylosis.  Some uncovertebral joint hypertrophy above the fusion.  Fairly normal anatomic alignment in the AP dimension.  Xr Shoulder Left  Result Date: 09/01/2019 X-rays of the left shoulder show no specific fracture or dislocation really minimal degenerative changes AC joint is well-maintained without degenerative change.   Past Medical/Family/Surgical/Social History: Medications & Allergies reviewed per EMR, new medications updated. Patient Active Problem List   Diagnosis Date Noted   Attention deficit disorder 02/04/2017   Gastroesophageal reflux disease 02/04/2017   Family history of heart disease in male family member before age 65 10/12/2015   Depression, major, in remission ([place])    Hypothyroid 05/16/2011   Hypertension 05/16/2011   Obesity 05/16/2011   Past Medical History:  Diagnosis Date   ADD (attention deficit disorder)    Allergy    Depression    GERD (gastroesophageal reflux disease)    Hypertension    Sleep apnea    Thyroid disease    History reviewed. No  pertinent family history. Past Surgical History:  Procedure Laterality Date   NO PAST SURGERIES     Social History   Occupational History   Not on file  Tobacco Use   Smoking status: Former Smoker    Quit date: 12/23/1998    Years since quitting: 20.7   Smokeless tobacco: Never Used  Substance and Sexual Activity   Alcohol use: Yes    Comment: 3 beers weekly in past 8 months; 1 pt weekly liquor   Drug use: No   Sexual activity: Never

## 2019-09-28 ENCOUNTER — Encounter: Payer: Self-pay | Admitting: Family Medicine

## 2019-10-19 ENCOUNTER — Other Ambulatory Visit: Payer: Self-pay

## 2019-10-19 ENCOUNTER — Ambulatory Visit: Payer: 59 | Admitting: Family Medicine

## 2019-10-19 ENCOUNTER — Encounter: Payer: Self-pay | Admitting: Family Medicine

## 2019-10-19 VITALS — BP 140/96 | HR 93 | Temp 97.7°F | Wt 254.4 lb

## 2019-10-19 DIAGNOSIS — N481 Balanitis: Secondary | ICD-10-CM

## 2019-10-19 DIAGNOSIS — F439 Reaction to severe stress, unspecified: Secondary | ICD-10-CM | POA: Diagnosis not present

## 2019-10-19 NOTE — Progress Notes (Signed)
   Subjective:    Patient ID: Russell Peters, male    DOB: 1966/09/28, 53 y.o.   MRN: [id number]  HPI He has noted skin irritation of the glans penis.  He is now much more sexually active and does have a new girlfriend.  She has had no difficulty.  He is also noted some slight discharge near the glans penis.  He is circumcised but the skin does pulled back over the head of the penis.  No dysuria, discharge urgency or frequency.  His girlfriend is having no vaginal discharge.  They do not use condoms. He also states that he is now back in counseling and is now dealing with the realization that he has had boundary issues with his exdaughter-in-law.  This was pointed out to him by his new girlfriend.  He realizes the need for this and is therefore motivated.   Review of Systems     Objective:   Physical Exam Exam of the penis shows slight erythema of the  glans penis the rest of the exam is negative including normal testes.       Assessment & Plan:  Balanitis  Stress at home Recommend he treat this like it is a yeast infection with Lamisil. I then discussed the counseling that he is getting and did gain some guidance concerning the fact that he is dealing with guilt and recognizing that he has become enabler.  Discussed issues of maintaining boundaries and how to handle it as it looks as if she is throwing guilt trips at him.  Over 25 minutes, greater than 50% spent in counseling and coordination of care

## 2019-10-19 NOTE — Patient Instructions (Signed)
Use the Lamisil sparingly twice a day for the next couple weeks

## 2019-12-11 ENCOUNTER — Other Ambulatory Visit: Payer: Self-pay | Admitting: Family Medicine

## 2019-12-11 DIAGNOSIS — E039 Hypothyroidism, unspecified: Secondary | ICD-10-CM

## 2019-12-22 ENCOUNTER — Telehealth: Payer: Self-pay

## 2019-12-22 NOTE — Telephone Encounter (Signed)
Pt. Called stating that he has had blood in his stool for about 3 and half weeks now, thought it would get better but it has not, last two days had blood in his stool twice daily and the blood amount is more a bit alarming now, abdomen feels tight and full, sometimes feels like he has to go to the bathroom but nothing comes out, he didn't know if he needed to go to the ER or if he needs a colonoscopy, or if you wanted to see him. Please advise.

## 2019-12-22 NOTE — Telephone Encounter (Signed)
Made an appt for Monday. [place]

## 2019-12-22 NOTE — Telephone Encounter (Signed)
Let him know that he does not need to go to the emergency room.  Have him come in next week to evaluate this further.  Also let him know that he is overdue for colon cancer screening and we can set that up when he comes in.

## 2019-12-27 ENCOUNTER — Other Ambulatory Visit: Payer: Self-pay

## 2019-12-27 ENCOUNTER — Ambulatory Visit (INDEPENDENT_AMBULATORY_CARE_PROVIDER_SITE_OTHER): Payer: 59 | Admitting: Family Medicine

## 2019-12-27 ENCOUNTER — Encounter: Payer: Self-pay | Admitting: Family Medicine

## 2019-12-27 ENCOUNTER — Encounter: Payer: Self-pay | Admitting: Gastroenterology

## 2019-12-27 VITALS — BP 120/86 | HR 95 | Temp 98.2°F | Wt 252.6 lb

## 2019-12-27 DIAGNOSIS — K625 Hemorrhage of anus and rectum: Secondary | ICD-10-CM | POA: Diagnosis not present

## 2019-12-27 LAB — HEMOCCULT GUIAC POC 1CARD (OFFICE): Fecal Occult Blood, POC: NEGATIVE

## 2019-12-27 NOTE — Progress Notes (Signed)
   Subjective:    Patient ID: Russell Peters, male    DOB: July 05, 1966, 54 y.o.   MRN: [id number]  HPI He has a 1 month history of bright red blood per rectum.  He notes this with every stool.  He has had no pain with this, felt no lumps or abdominal distress.  He did have Cologuard done in June 2019 which was negative.   Review of Systems     Objective:   Physical Exam Alert and in no distress.  Exam of the anal area shows no external hemorrhoids or rectal fissure.  Digital rectal exam was guaiac negative.  No lesions were palpable.      Assessment & Plan:  Rectal bleeding - Plan: CBC with Differential, Ambulatory referral to Gastroenterology, Hemoccult - 1 Card (office) Although his Cologuard was negative, I think it is reasonable to send to GI for further evaluation of the bleeding as well as possible colonoscopy

## 2019-12-28 LAB — CBC WITH DIFFERENTIAL/PLATELET
Basophils Absolute: 0 10*3/uL (ref 0.0–0.2)
Basos: 0 %
EOS (ABSOLUTE): 0.1 10*3/uL (ref 0.0–0.4)
Eos: 2 %
Hematocrit: 45.2 % (ref 37.5–51.0)
Hemoglobin: 15.7 g/dL (ref 13.0–17.7)
Immature Grans (Abs): 0 10*3/uL (ref 0.0–0.1)
Immature Granulocytes: 0 %
Lymphocytes Absolute: 2.8 10*3/uL (ref 0.7–3.1)
Lymphs: 33 %
MCH: 32.4 pg (ref 26.6–33.0)
MCHC: 34.7 g/dL (ref 31.5–35.7)
MCV: 93 fL (ref 79–97)
Monocytes Absolute: 0.8 10*3/uL (ref 0.1–0.9)
Monocytes: 9 %
Neutrophils Absolute: 4.8 10*3/uL (ref 1.4–7.0)
Neutrophils: 56 %
Platelets: 306 10*3/uL (ref 150–450)
RBC: 4.85 x10E6/uL (ref 4.14–5.80)
RDW: 12 % (ref 11.6–15.4)
WBC: 8.5 10*3/uL (ref 3.4–10.8)

## 2020-01-05 ENCOUNTER — Telehealth: Payer: Self-pay

## 2020-01-05 ENCOUNTER — Ambulatory Visit: Payer: 59 | Admitting: Gastroenterology

## 2020-01-05 ENCOUNTER — Encounter: Payer: Self-pay | Admitting: Gastroenterology

## 2020-01-05 VITALS — BP 138/90 | HR 68 | Temp 97.8°F | Ht 71.0 in | Wt 254.0 lb

## 2020-01-05 DIAGNOSIS — K625 Hemorrhage of anus and rectum: Secondary | ICD-10-CM

## 2020-01-05 DIAGNOSIS — Z01818 Encounter for other preprocedural examination: Secondary | ICD-10-CM

## 2020-01-05 MED ORDER — NA SULFATE-K SULFATE-MG SULF 17.5-3.13-1.6 GM/177ML PO SOLN: 1.0000 | Freq: Once | ORAL | 0 refills | Status: AC

## 2020-01-05 NOTE — Telephone Encounter (Signed)
-----   Message from Denita Lung, MD sent at 01/05/2020  4:30 PM EST ----- He needs to come in for a visit ----- Message ----- From: Doran Stabler, MD Sent: 01/05/2020   3:11 PM EST To: Denita Lung, MD  Dr. Redmond School,  I did write my note on this patient from today's visit to you, but wanted to be sure you are aware that he has been experiencing intermittent exertional chest pressure and dyspnea for about a year. Please make arrangements for him to have a cardiac stress test, as I would like it done before we proceed with colonoscopy.  Thanks very much.  Mallie Mussel

## 2020-01-05 NOTE — Progress Notes (Signed)
[facility] Consult Note:  History: Russell Peters 01/05/2020  Referring provider: Denita Lung, MD  Reason for consult/chief complaint: Rectal Bleeding (BRB on tissue and in toilet when patient has bowel movement)   Subjective  HPI:  This is a very pleasant 54 year old man referred by his primary care provider for recent onset rectal bleeding.  It began without clear trigger early last month, where he would have some painless bright red blood in the toilet bowl early every day.  Prior to that, he would typically have a BM about every other day, then it changed to be 1-2 times daily but still formed and otherwise normal.  He denies abdominal or rectal pain.  Negative Cologuard June 2019.  He denies frequent heartburn, denies dysphagia, odynophagia, nausea vomiting or early satiety.  He has gained some weight over this last year and says he is more sedentary than usual due to Covid.  ROS:  Review of Systems  Constitutional: Negative for appetite change and unexpected weight change.  HENT: Negative for mouth sores and voice change.   Eyes: Negative for pain and redness.  Respiratory: Negative for cough and shortness of breath.   Cardiovascular: Positive for chest pain. Negative for palpitations.  Genitourinary: Negative for dysuria and hematuria.  Musculoskeletal: Negative for arthralgias and myalgias.  Skin: Negative for pallor and rash.  Neurological: Negative for weakness and headaches.  Hematological: Negative for adenopathy.   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  He had not mentioned it to primary care, and attributed to being "out of shape".  Past Medical History: Past Medical History:  Diagnosis Date  . ADD (attention deficit disorder)   . Allergy   . Depression   . GERD  (gastroesophageal reflux disease)   . Hypertension   . Sleep apnea   . Thyroid disease      Past Surgical History: Past Surgical History:  Procedure Laterality Date  . NO PAST SURGERIES       Family History: Family History  Problem Relation Age of Onset  . Rheum arthritis Mother   . Esophageal cancer Father   . Colon cancer Maternal Grandfather   He reports that his father had heart disease in addition to ultimately dying from esophageal cancer  Social History: Social History   Socioeconomic History  . Marital status: Married    Spouse name: Not on file  . Number of children: Not on file  . Years of education: Not on file  . Highest education level: Not on file  Occupational History  . Not on file  Tobacco Use  . Smoking status: Former Smoker    Quit date: 12/23/1998    Years since quitting: 21.0  . Smokeless tobacco: Never Used  Substance and Sexual Activity  . Alcohol use: Yes    Comment: 3 beers weekly in past 8 months; 1 pt weekly liquor  . Drug use: No  . Sexual activity: Not Currently  Other Topics Concern  . Not on file  Social History Narrative  . Not on file   Social Determinants of Health   Financial Resource Strain:   . Difficulty of Paying Living Expenses: Not on file  Food Insecurity:   . Worried About Charity fundraiser in the Last Year: Not on file  . Ran Out  of Food in the Last Year: Not on file  Transportation Needs:   . Lack of Transportation (Medical): Not on file  . Lack of Transportation (Non-Medical): Not on file  Physical Activity:   . Days of Exercise per Week: Not on file  . Minutes of Exercise per Session: Not on file  Stress:   . Feeling of Stress : Not on file  Social Connections:   . Frequency of Communication with Friends and Family: Not on file  . Frequency of Social Gatherings with Friends and Family: Not on file  . Attends Religious Services: Not on file  . Active Member of Clubs or Organizations: Not on file  .  Attends Archivist Meetings: Not on file  . Marital Status: Not on file    Allergies: Allergies  Allergen Reactions  . Penicillins     Unknown reaction.     Outpatient Meds: Current Outpatient Medications  Medication Sig Dispense Refill  . amphetamine-dextroamphetamine (ADDERALL) 30 MG tablet Take 30 mg by mouth daily.    Marland Kitchen levothyroxine (SYNTHROID) 88 MCG tablet TAKE 1 TABLET BY MOUTH EVERY DAY 90 tablet 0  . lisinopril-hydrochlorothiazide (PRINZIDE,ZESTORETIC) 10-12.5 MG tablet Take 1 tablet by mouth daily. 90 tablet 3  . omeprazole (PRILOSEC) 20 MG capsule Take 1 capsule (20 mg total) by mouth daily.    . Na Sulfate-K Sulfate-Mg Sulf 17.5-3.13-1.6 GM/177ML SOLN Take 1 kit by mouth once for 1 dose. 354 mL 0   No current facility-administered medications for this visit.      ___________________________________________________________________ Objective   Exam:  BP 138/90   Pulse 68   Temp 97.8 F (36.6 C)   Ht 5' 11" (1.803 m)   Wt 254 lb (115.2 kg)   BMI 35.43 kg/m    General: Well-appearing  Eyes: sclera anicteric, no redness  ENT: oral mucosa moist without lesions, no cervical or supraclavicular lymphadenopathy  CV: RRR without murmur, S1/S2, no JVD, no peripheral edema  Resp: clear to auscultation bilaterally, normal RR and effort noted  GI: soft, obese, no tenderness, with active bowel sounds. No guarding or palpable organomegaly noted.  Skin; warm and dry, no rash or jaundice noted  Neuro: awake, alert and oriented x 3. Normal gross motor function and fluent speech Rectal: Normal external, no fissure, copious firm stool in rectal vault, heme positive  Labs:  Negative cologuard 05/2018  CBC Latest Ref Rng & Units 12/27/2019 03/11/2019 03/19/2018  WBC 3.4 - 10.8 x10E3/uL 8.5 7.4 9.5  Hemoglobin 13.0 - 17.7 g/dL 15.7 15.6 15.9  Hematocrit 37.5 - 51.0 % 45.2 45.9 46.2  Platelets 150 - 450 x10E3/uL 306 288 301     Assessment: Encounter  Diagnoses  Name Primary?  . Rectal bleeding Yes  . Preprocedural examination    Recent onset of painless rectal bleeding without development of constipation. He also reports intermittent exertional chest pressure and dyspnea.  Plan:  Half capful of MiraLAX a day to soften stool in case this is hemorrhoidal bleeding As needed use of Preparation H suppository Colonoscopy, which was scheduled a few weeks from now to allow time for patient to have a cardiac stress test.  Risks of colonoscopy were reviewed and patient was agreeable.  The benefits and risks of the planned procedure were described in detail with the patient or (when appropriate) their health care proxy.  Risks were outlined as including, but not limited to, bleeding, infection, perforation, adverse medication reaction leading to cardiac or pulmonary decompensation, pancreatitis (if ERCP).  The limitation of incomplete mucosal visualization was also discussed.  No guarantees or warranties were given.  I will copy this to his primary care provider and send him a separate message as well asking to arrange an exercise stress test for this patient.  Thank you for the courtesy of this consult.  Please call me with any questions or concerns.  Nelida Meuse III  CC: Referring provider noted above

## 2020-01-05 NOTE — Telephone Encounter (Signed)
Pt was called to schedule an appt. No answer lvm for pt to call back . [place]

## 2020-01-05 NOTE — Patient Instructions (Addendum)
If you are age 52 or older, your body mass index should be between 23-30. Your Body mass index is 35.43 kg/m. If this is out of the aforementioned range listed, please consider follow up with your Primary Care Provider.  If you are age 12 or younger, your body mass index should be between 19-25. Your Body mass index is 35.43 kg/m. If this is out of the aformentioned range listed, please consider follow up with your Primary Care Provider.   1/2 capful of Miralax daily in a glass of water or juice. Please use over the counter Preparation H as needed for rectal bleeding.  We will send a recommendation to your family doctor for a cardiac stress test.   You have been scheduled for a colonoscopy. Please follow written instructions given to you at your visit today.  Please pick up your prep supplies at the pharmacy within the next 1-3 days. If you use inhalers (even only as needed), please bring them with you on the day of your procedure.  It was a pleasure to see you today!  Dr. Loletha Carrow

## 2020-01-07 ENCOUNTER — Other Ambulatory Visit: Payer: Self-pay

## 2020-01-10 ENCOUNTER — Telehealth: Payer: Self-pay | Admitting: Cardiovascular Disease

## 2020-01-10 ENCOUNTER — Other Ambulatory Visit: Payer: Self-pay

## 2020-01-10 ENCOUNTER — Encounter: Payer: Self-pay | Admitting: Family Medicine

## 2020-01-10 ENCOUNTER — Ambulatory Visit: Payer: 59 | Admitting: Family Medicine

## 2020-01-10 VITALS — BP 132/88 | HR 86 | Temp 97.5°F | Wt 253.6 lb

## 2020-01-10 DIAGNOSIS — Z8249 Family history of ischemic heart disease and other diseases of the circulatory system: Secondary | ICD-10-CM | POA: Diagnosis not present

## 2020-01-10 DIAGNOSIS — E669 Obesity, unspecified: Secondary | ICD-10-CM | POA: Diagnosis not present

## 2020-01-10 DIAGNOSIS — E785 Hyperlipidemia, unspecified: Secondary | ICD-10-CM

## 2020-01-10 DIAGNOSIS — I10 Essential (primary) hypertension: Secondary | ICD-10-CM | POA: Diagnosis not present

## 2020-01-10 NOTE — Progress Notes (Signed)
Cardiology Office Note:   Date:  01/11/2020  NAME:  Russell Peters    MRN: [id number] DOB:  02/26/1966   PCP:  Denita Lung, MD  Cardiologist:  No primary care provider on file.   Referring MD: Denita Lung, MD   Chief Complaint  Patient presents with  . Pre-op Exam    History of Present Illness:   Russell Peters is a 54 y.o. male with a hx of hypertension, hyperlipidemia who is being seen today for the evaluation of preoperative assessment at the request of Denita Lung, MD.  He reports he is had rectal bleeding and will undergo colonoscopy.  He had an episode of shortness of breath in the summer while carrying a casket 150 yards.  He reports today was rather hot.  He reports he had some tightness then.  He has no symptoms of chest pain or shortness of breath with activity.  He is able to climb 2 flights of stairs without any limitations.  He reports that day was unusual and out of the ordinary for him.  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  Cardiovascular disease risk factors include hypertension, hyperlipidemia.  He has no prior history of myocardial infarction, stroke, CHF, diabetes, CKD.  His most recent lipid profile was uncontrolled.  He is going to start taking Lipitor.  His EKG today shows normal sinus rhythm without any acute ST-T changes.  He has had no further symptoms since that episode in the summer.  Past Medical History: Past Medical History:  Diagnosis Date  . ADD (attention deficit disorder)   . Allergy   . Depression   . GERD (gastroesophageal reflux disease)   . Hypertension   . Sleep apnea   . Thyroid disease     Past Surgical History: Past Surgical History:  Procedure Laterality Date  . NO PAST SURGERIES      Current Medications: Current Meds  Medication Sig  . amphetamine-dextroamphetamine (ADDERALL) 30 MG tablet Take 30 mg by mouth daily.  Marland Kitchen atorvastatin (LIPITOR) 20 MG tablet Take 1 tablet (20 mg total) by mouth daily.  Marland Kitchen  levothyroxine (SYNTHROID) 88 MCG tablet TAKE 1 TABLET BY MOUTH EVERY DAY  . lisinopril-hydrochlorothiazide (PRINZIDE,ZESTORETIC) 10-12.5 MG tablet Take 1 tablet by mouth daily.  Marland Kitchen omeprazole (PRILOSEC) 20 MG capsule Take 1 capsule (20 mg total) by mouth daily.     Allergies:    Penicillins   Social History: Social History   Socioeconomic History  . Marital status: Married    Spouse name: Not on file  . Number of children: Not on file  . Years of education: Not on file  . Highest education level: Not on file  Occupational History  . Not on file  Tobacco Use  . Smoking status: Former Smoker    Quit date: 12/23/1998    Years since quitting: 21.0  . Smokeless tobacco: Never Used  Substance and Sexual Activity  . Alcohol use: Yes    Comment: 3 beers weekly in past 8 months; 1 pt weekly liquor  . Drug use: No  . Sexual activity: Not Currently  Other Topics Concern  . Not on file  Social History Narrative  . Not on file   Social Determinants of Health   Financial Resource Strain:   . Difficulty of Paying Living Expenses: Not on file  Food Insecurity:   . Worried About Charity fundraiser in the Last Year: Not on file  . Ran Out of  Food in the Last Year: Not on file  Transportation Needs:   . Lack of Transportation (Medical): Not on file  . Lack of Transportation (Non-Medical): Not on file  Physical Activity:   . Days of Exercise per Week: Not on file  . Minutes of Exercise per Session: Not on file  Stress:   . Feeling of Stress : Not on file  Social Connections:   . Frequency of Communication with Friends and Family: Not on file  . Frequency of Social Gatherings with Friends and Family: Not on file  . Attends Religious Services: Not on file  . Active Member of Clubs or Organizations: Not on file  . Attends Archivist Meetings: Not on file  . Marital Status: Not on file     Family History: The patient's family history includes Colon cancer in his maternal  grandfather; Esophageal cancer in his father; Rheum arthritis in his mother.  ROS:   All other ROS reviewed and negative. Pertinent positives noted in the HPI.     EKGs/Labs/Other Studies Reviewed:   The following studies were personally reviewed by me today:  EKG:  EKG is ordered today.  The ekg ordered today demonstrates normal sinus rhythm, heart rate 92, no acute ST-T changes, no evidence of prior infarction, and was personally reviewed by me.   Recent Labs: 03/11/2019: ALT 24; BUN 14; Creatinine, Ser 1.15; Potassium 4.9; Sodium 139; TSH 3.170 12/27/2019: Hemoglobin 15.7; Platelets 306   Recent Lipid Panel    Component Value Date/Time   CHOL 230 (H) 01/10/2020 0900   TRIG 84 01/10/2020 0900   HDL 48 01/10/2020 0900   CHOLHDL 4.8 01/10/2020 0900   CHOLHDL 4.7 02/04/2017 1504   VLDL 18 02/04/2017 1504   LDLCALC 167 (H) 01/10/2020 0900    Physical Exam:   VS:  BP (!) 126/92   Pulse 92   Ht 5\' 11"  (1.803 m)   Wt 254 lb (115.2 kg)   SpO2 100%   BMI 35.43 kg/m    Wt Readings from Last 3 Encounters:  01/11/20 254 lb (115.2 kg)  01/10/20 253 lb 9.6 oz (115 kg)  01/05/20 254 lb (115.2 kg)    General: Well nourished, well developed, in no acute distress Heart: Atraumatic, normal size  Eyes: PEERLA, EOMI  Neck: Supple, no JVD Endocrine: No thryomegaly Cardiac: Normal S1, S2; RRR; no murmurs, rubs, or gallops Lungs: Clear to auscultation bilaterally, no wheezing, rhonchi or rales  Abd: Soft, nontender, no hepatomegaly  Ext: No edema, pulses 2+ Musculoskeletal: No deformities, BUE and BLE strength normal and equal Skin: Warm and dry, no rashes   Neuro: Alert and oriented to person, place, time, and situation, CNII-XII grossly intact, no focal deficits  Psych: Normal mood and affect   ASSESSMENT:   Russell Peters is a 54 y.o. male who presents for the following: 1. Preoperative cardiovascular examination   2. Essential hypertension   3. Mixed hyperlipidemia   4. Obesity  (BMI 30-39.9)     PLAN:   1. Preoperative cardiovascular examination -The Revised Cardiac Risk Index = 0 which equates to very low risk (0=0.4%) estimated risk of perioperative myocardial infarction, pulmonary edema, ventricular fibrillation, cardiac arrest, or complete heart block.  -No further cardiac testing is recommended prior to surgery.  -The patient may proceed to surgery at acceptable risk.   -He can easily complete greater than 4 METS without any major limitations. -The episode that occurred in the summer appears to be a one-time episode  that was out of the ordinary for him.  I see no reason to delay his colonoscopy for this.  Should he have further symptoms or issues we can reevaluate him but no major evaluation is needed prior to his planned colonoscopy. - Our service is available as needed in the peri-operative period.    2. Essential hypertension -Continue current BP medications  3. Mixed hyperlipidemia -I have asked him to go ahead with his Lipitor as planned by his primary care physician  4. Obesity (BMI 30-39.9) -Counseled on importance of weight loss and exercise  Disposition: No follow-ups on file.  Medication Adjustments/Labs and Tests Ordered: Current medicines are reviewed at length with the patient today.  Concerns regarding medicines are outlined above.  Orders Placed This Encounter  Procedures  . EKG 12-Lead   No orders of the defined types were placed in this encounter.   Patient Instructions  Medication Instructions:  The current medical regimen is effective;  continue present plan and medications.  *If you need a refill on your cardiac medications before your next appointment, please call your pharmacy*  Follow-Up: At [facility], you and your health needs are our priority.  As part of our continuing mission to provide you with exceptional heart care, we have created designated Provider Care Teams.  These Care Teams include your primary  Cardiologist (physician) and Advanced Practice Providers (APPs -  Physician Assistants and Nurse Practitioners) who all work together to provide you with the care you need, when you need it.  Your next appointment:   As needed  The format for your next appointment:   Either In Person or Virtual  Provider:   Eleonore Chiquito, MD      Signed, Addison Naegeli. Audie Box, [place]  [address] [place], [place] [address] [phone]  01/11/2020 3:37 PM    '

## 2020-01-10 NOTE — Telephone Encounter (Signed)
New Message      [facility] Pre-operative Risk Assessment    Request for surgical clearance:  1. What type of surgery is being performed? Colonoscopy  2. When is this surgery scheduled? 01/27/2020   3. What type of clearance is required (medical clearance vs. Pharmacy clearance to hold med vs. Both)? Medical   4. Are there any medications that need to be held prior to surgery and how long? none   5. Practice name and name of physician performing surgery? [facility], Dr. Wilfrid Lund  6. What is your office phone number [phone]   7.   What is your office fax number [phone]   8.   Anesthesia type (None, local, MAC, general) ? Unknown, pcp was calling to schedule and not [facility]   Russell Peters 01/10/2020, 12:03 PM  _________________________________________________________________   (provider comments below)

## 2020-01-10 NOTE — Telephone Encounter (Signed)
   Primary Cardiologist:No primary care provider on file.  Chart reviewed as part of pre-operative protocol coverage.  Russell Peters is not currently followed by our service.   Pre-op covering staff: - Please contact requesting surgeon's office via preferred method (i.e, phone, fax) to inform them that this patient has never been seen by [facility] need for appointment prior to surgery.   Kathyrn Drown, NP  01/10/2020, 12:16 PM

## 2020-01-10 NOTE — Progress Notes (Addendum)
   Subjective:    Patient ID: Russell Peters, male    DOB: 18-Mar-1966, 54 y.o.   MRN: [id number]  HPI He is here for consult concerning underlying cardiovascular disease.  He does have a family history of heart disease however he is not really had any dyspnea on exertion, PND, chest pain.  He did describe one episode of being short of breath however it was after lifting a casket and helping to move at 150 yards!  He is scheduled for colonoscopy and this is what triggered the visit.   Review of Systems     Objective:   Physical Exam Alert and in no distress.  Cardiac exam shows regular rhythm without murmurs or gallop.  Lungs are clear to auscultation.  EKG is normal.       Assessment & Plan:  Family history of heart disease in male family member before age 62 - Plan: Lipid panel, EKG 12-Lead, Ambulatory referral to Cardiology  Essential hypertension  Obesity, Class II, BMI 35-39.9 I think it is reasonable to get a cardiology evaluation to assess his risk.  Explained that he will most likely be placed on a statin drug but we will wait to see what the lipids show. 1/19 the LDL is 167.  I will place him on Lipitor.

## 2020-01-11 ENCOUNTER — Ambulatory Visit: Payer: 59 | Admitting: Cardiovascular Disease

## 2020-01-11 ENCOUNTER — Encounter: Payer: Self-pay | Admitting: Cardiovascular Disease

## 2020-01-11 VITALS — BP 126/92 | HR 92 | Ht 71.0 in | Wt 254.0 lb

## 2020-01-11 DIAGNOSIS — E782 Mixed hyperlipidemia: Secondary | ICD-10-CM

## 2020-01-11 DIAGNOSIS — I10 Essential (primary) hypertension: Secondary | ICD-10-CM | POA: Diagnosis not present

## 2020-01-11 DIAGNOSIS — Z01810 Encounter for preprocedural cardiovascular examination: Secondary | ICD-10-CM | POA: Diagnosis not present

## 2020-01-11 DIAGNOSIS — E669 Obesity, unspecified: Secondary | ICD-10-CM | POA: Diagnosis not present

## 2020-01-11 LAB — LIPID PANEL
Chol/HDL Ratio: 4.8 ratio (ref 0.0–5.0)
Cholesterol, Total: 230 mg/dL — ABNORMAL HIGH (ref 100–199)
HDL: 48 mg/dL (ref >39)
LDL Chol Calc (NIH): 167 mg/dL — ABNORMAL HIGH (ref 0–99)
Triglycerides: 84 mg/dL (ref 0–149)
VLDL Cholesterol Cal: 15 mg/dL (ref 5–40)

## 2020-01-11 MED ORDER — ATORVASTATIN CALCIUM 20 MG PO TABS: 20.0000 mg | ORAL_TABLET | Freq: Every day | ORAL | 3 refills | Status: DC

## 2020-01-11 NOTE — Telephone Encounter (Signed)
Pt has appt today with Dr. Marisue Ivan. I will send pre op notes to MD for clearance.

## 2020-01-11 NOTE — Patient Instructions (Signed)
Medication Instructions:  The current medical regimen is effective;  continue present plan and medications.  *If you need a refill on your cardiac medications before your next appointment, please call your pharmacy*  Follow-Up: At [facility], you and your health needs are our priority.  As part of our continuing mission to provide you with exceptional heart care, we have created designated Provider Care Teams.  These Care Teams include your primary Cardiologist (physician) and Advanced Practice Providers (APPs -  Physician Assistants and Nurse Practitioners) who all work together to provide you with the care you need, when you need it.  Your next appointment:   As needed  The format for your next appointment:   Either In Person or Virtual  Provider:   Eleonore Chiquito, MD

## 2020-01-11 NOTE — Addendum Note (Signed)
Addended by: Denita Lung on: 01/11/2020 08:19 AM   Modules accepted: Orders

## 2020-01-25 ENCOUNTER — Other Ambulatory Visit: Payer: Self-pay | Admitting: Gastroenterology

## 2020-01-25 ENCOUNTER — Ambulatory Visit (INDEPENDENT_AMBULATORY_CARE_PROVIDER_SITE_OTHER): Payer: 59

## 2020-01-25 DIAGNOSIS — Z1159 Encounter for screening for other viral diseases: Secondary | ICD-10-CM

## 2020-01-25 LAB — SARS CORONAVIRUS 2 (TAT 6-24 HRS): SARS Coronavirus 2: NEGATIVE

## 2020-01-27 ENCOUNTER — Other Ambulatory Visit: Payer: Self-pay

## 2020-01-27 ENCOUNTER — Encounter: Payer: Self-pay | Admitting: Gastroenterology

## 2020-01-27 ENCOUNTER — Ambulatory Visit (AMBULATORY_SURGERY_CENTER): Payer: 59 | Admitting: Gastroenterology

## 2020-01-27 VITALS — BP 123/91 | HR 83 | Temp 97.3°F | Resp 10 | Ht 71.0 in | Wt 254.0 lb

## 2020-01-27 DIAGNOSIS — Z1211 Encounter for screening for malignant neoplasm of colon: Secondary | ICD-10-CM

## 2020-01-27 DIAGNOSIS — D124 Benign neoplasm of descending colon: Secondary | ICD-10-CM

## 2020-01-27 DIAGNOSIS — D123 Benign neoplasm of transverse colon: Secondary | ICD-10-CM | POA: Diagnosis not present

## 2020-01-27 DIAGNOSIS — K625 Hemorrhage of anus and rectum: Secondary | ICD-10-CM

## 2020-01-27 MED ORDER — SODIUM CHLORIDE 0.9 % IV SOLN: 500.0000 mL | Freq: Once | INTRAVENOUS | Status: DC

## 2020-01-27 NOTE — Progress Notes (Signed)
Temp LC V/S CW 

## 2020-01-27 NOTE — Progress Notes (Signed)
Called to room to assist during endoscopic procedure.  Patient ID and intended procedure confirmed with present staff. Received instructions for my participation in the procedure from the performing physician.  

## 2020-01-27 NOTE — Patient Instructions (Signed)
YOU HAD AN ENDOSCOPIC PROCEDURE TODAY AT [facility]:   Refer to the procedure report that was given to you for any specific questions about what was found during the examination.  If the procedure report does not answer your questions, please call your gastroenterologist to clarify.  If you requested that your care partner not be given the details of your procedure findings, then the procedure report has been included in a sealed envelope for you to review at your convenience later.  YOU SHOULD EXPECT: Some feelings of bloating in the abdomen. Passage of more gas than usual.  Walking can help get rid of the air that was put into your GI tract during the procedure and reduce the bloating. If you had a lower endoscopy (such as a colonoscopy or flexible sigmoidoscopy) you may notice spotting of blood in your stool or on the toilet paper. If you underwent a bowel prep for your procedure, you may not have a normal bowel movement for a few days.  Please Note:  You might notice some irritation and congestion in your nose or some drainage.  This is from the oxygen used during your procedure.  There is no need for concern and it should clear up in a day or so.  SYMPTOMS TO REPORT IMMEDIATELY:   Following lower endoscopy (colonoscopy or flexible sigmoidoscopy):  Excessive amounts of blood in the stool  Significant tenderness or worsening of abdominal pains  Swelling of the abdomen that is new, acute  Fever of 100F or higher   For urgent or emergent issues, a gastroenterologist can be reached at any hour by calling [phone].   DIET:  We do recommend a small meal at first, but then you may proceed to your regular diet.  Drink plenty of fluids but you should avoid alcoholic beverages for 24 hours.  ACTIVITY:  You should plan to take it easy for the rest of today and you should NOT DRIVE or use heavy machinery until tomorrow (because of the sedation medicines used during the test).     FOLLOW UP: Our staff will call the number listed on your records 48-72 hours following your procedure to check on you and address any questions or concerns that you may have regarding the information given to you following your procedure. If we do not reach you, we will leave a message.  We will attempt to reach you two times.  During this call, we will ask if you have developed any symptoms of COVID 19. If you develop any symptoms (ie: fever, flu-like symptoms, shortness of breath, cough etc.) before then, please call [phone].  If you test positive for Covid 19 in the 2 weeks post procedure, please call and report this information to [place].    If any biopsies were taken you will be contacted by phone or by letter within the next 1-3 weeks.  Please call us at [phone] if you have not heard about the biopsies in 3 weeks.    SIGNATURES/CONFIDENTIALITY: You and/or your care partner have signed paperwork which will be entered into your electronic medical record.  These signatures attest to the fact that that the information above on your After Visit Summary has been reviewed and is understood.  Full responsibility of the confidentiality of this discharge information lies with you and/or your care-partner.   Resume medications. Information given on polyps,diverticulosis and hemorrhoids.

## 2020-01-27 NOTE — Progress Notes (Signed)
Report given to PACU, vss 

## 2020-01-27 NOTE — Op Note (Signed)
Russell Peters Patient Name: Russell Peters Procedure Date: 01/27/2020 10:34 AM MRN: [id number] Endoscopist: Mallie Mussel L. Russell Peters , MD Age: 54 Referring MD:  Date of Birth: 31-Jul-1966 Gender: Male Account #: [id number] Procedure:                Colonoscopy Indications:              Screening for colorectal malignant neoplasm, This                            is the patient's first colonoscopy                           (Patient incidentally had a recent episode of                            self-limited rectal bleeding) Medicines:                Monitored Anesthesia Care Procedure:                Pre-Anesthesia Assessment:                           - Prior to the procedure, a History and Physical                            was performed, and patient medications and                            allergies were reviewed. The patient's tolerance of                            previous anesthesia was also reviewed. The risks                            and benefits of the procedure and the sedation                            options and risks were discussed with the patient.                            All questions were answered, and informed consent                            was obtained. Prior Anticoagulants: The patient has                            taken no previous anticoagulant or antiplatelet                            agents. ASA Grade Assessment: II - A patient with                            mild systemic disease. After reviewing the risks  and benefits, the patient was deemed in                            satisfactory condition to undergo the procedure.                           After obtaining informed consent, the colonoscope                            was passed under direct vision. Throughout the                            procedure, the patient's blood pressure, pulse, and                            oxygen saturations were monitored continuously. The                             Colonoscope was introduced through the anus and                            advanced to the the cecum, identified by                            appendiceal orifice and ileocecal valve. The                            colonoscopy was performed without difficulty. The                            patient tolerated the procedure well. The quality                            of the bowel preparation was initially fair, then                            improved to good with lavage. The ileocecal valve,                            appendiceal orifice, and rectum were photographed.                            The bowel preparation used was SUPREP. Scope In: 10:55:23 AM Scope Out: 11:25:41 AM Scope Withdrawal Time: 0 hours 24 minutes 34 seconds  Total Procedure Duration: 0 hours 30 minutes 18 seconds  Findings:                 The perianal and digital rectal examinations were                            normal.                           Four sessile polyps were found in the descending  colon and transverse colon. The polyps were 4 to 6                            mm in size. These polyps were removed with a cold                            snare. Resection and retrieval were complete.                           Two sessile polyps were found in the descending                            colon. The polyps were 5 mm in size. These polyps                            were removed with a cold snare. Resection and                            retrieval were complete.                           Two semi-pedunculated polyps were found in the                            descending colon. The polyps were 6 to 8 mm in                            size. These polyps were removed with a hot snare.                            Resection and retrieval were complete.                           Multiple diverticula were found in the left colon.                           Internal  hemorrhoids were found.                           The exam was otherwise without abnormality on                            direct and retroflexion views. Complications:            No immediate complications. Estimated Blood Loss:     Estimated blood loss was minimal. Impression:               - Four 4 to 6 mm polyps in the descending colon and                            in the transverse colon, removed with a cold snare.  Resected and retrieved.                           - Two 5 mm polyps in the descending colon, removed                            with a cold snare. Resected and retrieved.                           - Two 6 to 8 mm polyps in the descending colon,                            removed with a hot snare. Resected and retrieved.                           - Diverticulosis in the left colon.                           - Internal hemorrhoids.                           - The examination was otherwise normal on direct                            and retroflexion views. Recommendation:           - Patient has a contact number available for                            emergencies. The signs and symptoms of potential                            delayed complications were discussed with the                            patient. Return to normal activities tomorrow.                            Written discharge instructions were provided to the                            patient.                           - Resume previous diet.                           - Continue present medications.                           - Await pathology results.                           - Repeat colonoscopy in 3 years for surveillance.                            (  2-DAY PREP for next exam)                           - Increase dietary fiber, water intake and physical                            activity to avoid constipation and hemorrhoidal                            bleeding. Bucky Grigg L. Russell Carrow,  MD 01/27/2020 11:33:46 AM This report has been signed electronically.

## 2020-01-31 ENCOUNTER — Telehealth: Payer: Self-pay

## 2020-01-31 NOTE — Telephone Encounter (Signed)
Left message on follow up call. 

## 2020-02-04 ENCOUNTER — Encounter: Payer: Self-pay | Admitting: Gastroenterology

## 2020-03-23 ENCOUNTER — Other Ambulatory Visit: Payer: Self-pay | Admitting: Family Medicine

## 2020-03-23 DIAGNOSIS — E039 Hypothyroidism, unspecified: Secondary | ICD-10-CM

## 2020-05-08 ENCOUNTER — Other Ambulatory Visit: Payer: Self-pay | Admitting: Family Medicine

## 2020-05-08 DIAGNOSIS — I10 Essential (primary) hypertension: Secondary | ICD-10-CM

## 2020-05-29 ENCOUNTER — Encounter: Payer: Self-pay | Admitting: Family Medicine

## 2020-05-30 ENCOUNTER — Other Ambulatory Visit: Payer: Self-pay

## 2020-05-30 DIAGNOSIS — I10 Essential (primary) hypertension: Secondary | ICD-10-CM

## 2020-05-30 MED ORDER — LISINOPRIL-HYDROCHLOROTHIAZIDE 10-12.5 MG PO TABS: 1.0000 | ORAL_TABLET | Freq: Every day | ORAL | 3 refills | Status: DC

## 2020-06-29 ENCOUNTER — Other Ambulatory Visit: Payer: Self-pay | Admitting: Family Medicine

## 2020-06-29 DIAGNOSIS — E039 Hypothyroidism, unspecified: Secondary | ICD-10-CM

## 2020-07-17 ENCOUNTER — Ambulatory Visit: Payer: 59 | Admitting: Family Medicine

## 2020-07-17 ENCOUNTER — Other Ambulatory Visit: Payer: Self-pay

## 2020-07-17 VITALS — BP 130/92 | HR 92 | Temp 97.7°F | Wt 268.8 lb

## 2020-07-17 DIAGNOSIS — K219 Gastro-esophageal reflux disease without esophagitis: Secondary | ICD-10-CM

## 2020-07-17 DIAGNOSIS — Z1159 Encounter for screening for other viral diseases: Secondary | ICD-10-CM

## 2020-07-17 DIAGNOSIS — E669 Obesity, unspecified: Secondary | ICD-10-CM

## 2020-07-17 DIAGNOSIS — F325 Major depressive disorder, single episode, in full remission: Secondary | ICD-10-CM | POA: Diagnosis not present

## 2020-07-17 DIAGNOSIS — Z8249 Family history of ischemic heart disease and other diseases of the circulatory system: Secondary | ICD-10-CM | POA: Diagnosis not present

## 2020-07-17 DIAGNOSIS — F988 Other specified behavioral and emotional disorders with onset usually occurring in childhood and adolescence: Secondary | ICD-10-CM

## 2020-07-17 DIAGNOSIS — E039 Hypothyroidism, unspecified: Secondary | ICD-10-CM

## 2020-07-17 DIAGNOSIS — I10 Essential (primary) hypertension: Secondary | ICD-10-CM

## 2020-07-17 DIAGNOSIS — Z86010 Personal history of colonic polyps: Secondary | ICD-10-CM | POA: Insufficient documentation

## 2020-07-17 NOTE — Progress Notes (Addendum)
   Subjective:    Patient ID: Russell Peters, male    DOB: May 29, 1966, 54 y.o.   MRN: [id number]  HPI He is here for an interval evaluation.  Earlier this year he did have a colonoscopy which did show some colonic polyps and is scheduled for repeat in 3 years.  He continues on Synthroid and is having no difficulty with that.  Also atorvastatin is causing no aches or pains.  He continues on lisinopril/HCTZ.  He has gained some weight and blames this on a recent break-up.  He seems to be handling this well.  Psychologically he says he is doing quite well but he does see Dr. Toy Peters and also continues to get counseling.  She mainly gives him his Adderall.  His work is going well.  He does have a family history of heart disease. he has no other concerns.   Review of Systems     Objective:   Physical Exam Alert and in no distress. Tympanic membranes and canals are normal. Pharyngeal area is normal. Neck is supple without adenopathy or thyromegaly. Cardiac exam shows a regular sinus rhythm without murmurs or gallops. Lungs are clear to auscultation.        Assessment & Plan:  Family history of heart disease in male family member before age 85 - Plan: CBC with Differential/Platelet, Comprehensive metabolic panel, Lipid panel  Attention deficit disorder, unspecified hyperactivity presence  Depression, major, in remission ([place])  Gastroesophageal reflux disease, unspecified whether esophagitis present - Plan: CBC with Differential/Platelet  Essential hypertension - Plan: CBC with Differential/Platelet, Comprehensive metabolic panel, CANCELED: CBC with Differential/Platelet, CANCELED: Comprehensive metabolic panel  Hypothyroidism, unspecified type - Plan: TSH, CANCELED: TSH  Obesity, Class II, BMI 35-39.9 - Plan: CBC with Differential/Platelet, Lipid panel, CANCELED: CBC with Differential/Platelet, CANCELED: Comprehensive metabolic panel, CANCELED: Lipid panel He will continue on his present  medications.  I then discussed risk for hepatitis C.  He apparently did get his Covid vaccine.  He will continue to be followed by Dr. Toy Peters although I did suggest that he can get his Adderall for me if he would like. I then discussed diet and exercise specifically cutting back on carbohydrates.  Over 30 minutes spent discussing all these issues with him.   06/27/21 lipids are not at goal.  I will switch him to Crestor to get better control

## 2020-07-18 LAB — CBC WITH DIFFERENTIAL/PLATELET
Basophils Absolute: 0.1 10*3/uL (ref 0.0–0.2)
Basos: 1 %
EOS (ABSOLUTE): 0.3 10*3/uL (ref 0.0–0.4)
Eos: 3 %
Hematocrit: 48.1 % (ref 37.5–51.0)
Hemoglobin: 16.1 g/dL (ref 13.0–17.7)
Immature Grans (Abs): 0 10*3/uL (ref 0.0–0.1)
Immature Granulocytes: 1 %
Lymphocytes Absolute: 2 10*3/uL (ref 0.7–3.1)
Lymphs: 25 %
MCH: 30.7 pg (ref 26.6–33.0)
MCHC: 33.5 g/dL (ref 31.5–35.7)
MCV: 92 fL (ref 79–97)
Monocytes Absolute: 0.8 10*3/uL (ref 0.1–0.9)
Monocytes: 10 %
Neutrophils Absolute: 4.9 10*3/uL (ref 1.4–7.0)
Neutrophils: 60 %
Platelets: 276 10*3/uL (ref 150–450)
RBC: 5.24 x10E6/uL (ref 4.14–5.80)
RDW: 12.3 % (ref 11.6–15.4)
WBC: 8.1 10*3/uL (ref 3.4–10.8)

## 2020-07-18 LAB — COMPREHENSIVE METABOLIC PANEL
ALT: 30 IU/L (ref 0–44)
AST: 19 IU/L (ref 0–40)
Albumin/Globulin Ratio: 2.4 — ABNORMAL HIGH (ref 1.2–2.2)
Albumin: 4.7 g/dL (ref 3.8–4.9)
Alkaline Phosphatase: 81 IU/L (ref 48–121)
BUN/Creatinine Ratio: 15 (ref 9–20)
BUN: 15 mg/dL (ref 6–24)
Bilirubin Total: 0.5 mg/dL (ref 0.0–1.2)
CO2: 25 mmol/L (ref 20–29)
Calcium: 9.9 mg/dL (ref 8.7–10.2)
Chloride: 99 mmol/L (ref 96–106)
Creatinine, Ser: 1.01 mg/dL (ref 0.76–1.27)
GFR calc Af Amer: 98 mL/min/{1.73_m2} (ref >59)
GFR calc non Af Amer: 85 mL/min/{1.73_m2} (ref >59)
Globulin, Total: 2 g/dL (ref 1.5–4.5)
Glucose: 148 mg/dL — ABNORMAL HIGH (ref 65–99)
Potassium: 5.2 mmol/L (ref 3.5–5.2)
Sodium: 140 mmol/L (ref 134–144)
Total Protein: 6.7 g/dL (ref 6.0–8.5)

## 2020-07-18 LAB — LIPID PANEL
Chol/HDL Ratio: 3.3 ratio (ref 0.0–5.0)
Cholesterol, Total: 177 mg/dL (ref 100–199)
HDL: 53 mg/dL (ref >39)
LDL Chol Calc (NIH): 113 mg/dL — ABNORMAL HIGH (ref 0–99)
Triglycerides: 55 mg/dL (ref 0–149)
VLDL Cholesterol Cal: 11 mg/dL (ref 5–40)

## 2020-07-18 LAB — TSH: TSH: 2.22 u[IU]/mL (ref 0.450–4.500)

## 2020-07-18 LAB — HEPATITIS C ANTIBODY: Hep C Virus Ab: <0.1 s/co ratio (ref 0.0–0.9)

## 2020-07-24 ENCOUNTER — Ambulatory Visit: Payer: 59 | Admitting: Family Medicine

## 2020-07-24 ENCOUNTER — Other Ambulatory Visit: Payer: Self-pay

## 2020-07-24 VITALS — BP 130/88 | HR 98 | Temp 98.4°F | Wt 269.8 lb

## 2020-07-24 DIAGNOSIS — E119 Type 2 diabetes mellitus without complications: Secondary | ICD-10-CM | POA: Diagnosis not present

## 2020-07-24 NOTE — Progress Notes (Signed)
° °  Subjective:    Patient ID: Russell Peters, male    DOB: 01-14-1966, 54 y.o.   MRN: [id number]  HPI He is here for follow-up on recent blood work which did show a hemoglobin A1c of 7.2.  In the past he did have difficulty with glucose intolerance.  He is presently asymptomatic.   Review of Systems     Objective:   Physical Exam Alert and in no distress otherwise not examined       Assessment & Plan:  New onset type 2 diabetes mellitus ([place]) My nurse and I discussed diabetes with him in regard to diet, exercise, foot care, eye care, checking blood sugars.  Discussed the need for him to maintain a regular eating as well as exercise patterns.  He was instructed on how to check his blood sugars.  All of his questions were answered.  He was encouraged to write down any questions he has and we will readdress these in 1 month.  He was comfortable with that.

## 2020-07-25 ENCOUNTER — Other Ambulatory Visit: Payer: Self-pay

## 2020-07-25 LAB — SPECIMEN STATUS REPORT

## 2020-07-25 LAB — HGB A1C W/O EAG: Hgb A1c MFr Bld: 7.2 % — ABNORMAL HIGH (ref 4.8–5.6)

## 2020-07-25 MED ORDER — GLUCOSE BLOOD VI STRP: 1.0000 | ORAL_STRIP | 3 refills | Status: AC | PRN

## 2020-07-25 MED ORDER — ACCU-CHEK SOFTCLIX LANCETS MISC: 1.0000 | Freq: Every day | 3 refills | Status: AC

## 2020-07-27 ENCOUNTER — Other Ambulatory Visit: Payer: Self-pay | Admitting: Family Medicine

## 2020-07-27 DIAGNOSIS — E039 Hypothyroidism, unspecified: Secondary | ICD-10-CM

## 2020-07-27 NOTE — Telephone Encounter (Signed)
[facility]. Please advise [facility] 

## 2020-08-15 DIAGNOSIS — Z0289 Encounter for other administrative examinations: Secondary | ICD-10-CM

## 2020-08-22 ENCOUNTER — Other Ambulatory Visit: Payer: Self-pay | Admitting: Family Medicine

## 2020-08-22 DIAGNOSIS — E039 Hypothyroidism, unspecified: Secondary | ICD-10-CM

## 2020-08-29 ENCOUNTER — Ambulatory Visit: Payer: 59 | Admitting: Family Medicine

## 2020-08-29 ENCOUNTER — Encounter: Payer: Self-pay | Admitting: Family Medicine

## 2020-08-29 VITALS — BP 146/96 | HR 74 | Temp 97.4°F | Wt 259.0 lb

## 2020-08-29 DIAGNOSIS — E119 Type 2 diabetes mellitus without complications: Secondary | ICD-10-CM

## 2020-08-29 NOTE — Progress Notes (Signed)
° °  Subjective:    Patient ID: Russell Peters, male    DOB: 1966/06/18, 54 y.o.   MRN: [id number]  HPI He is here for recheck on his diabetes.  He has been checking his blood sugars before meals and 2 hours after meal he has had a few of them were elevated and did recognize what the issue was with that.  He did have concerns over blood sugar elevations when he wakes up in the morning.  He also notes that he is lost several pounds.   Review of Systems     Objective:   Physical Exam Alert and in no distress otherwise not examined      Assessment & Plan:  New onset type 2 diabetes mellitus ([place]) I explained the mechanism of lack of feedback causing the blood sugar to elevate in regard to the liver gluconeogenesis to him.  He did seem to understand this.  Encouraged him to continue with his diet and exercise regimen.  Discussed the fact that we will continue lifestyle change we could potentially relabel him diabetes in remission.

## 2020-09-14 ENCOUNTER — Other Ambulatory Visit: Payer: Self-pay

## 2020-09-14 ENCOUNTER — Telehealth: Payer: Self-pay

## 2020-09-14 DIAGNOSIS — I10 Essential (primary) hypertension: Secondary | ICD-10-CM

## 2020-09-14 DIAGNOSIS — E039 Hypothyroidism, unspecified: Secondary | ICD-10-CM

## 2020-09-14 DIAGNOSIS — E785 Hyperlipidemia, unspecified: Secondary | ICD-10-CM

## 2020-09-14 MED ORDER — ATORVASTATIN CALCIUM 20 MG PO TABS: 20.0000 mg | ORAL_TABLET | Freq: Every day | ORAL | 0 refills | Status: DC

## 2020-09-14 MED ORDER — LEVOTHYROXINE SODIUM 88 MCG PO TABS: 88.0000 ug | ORAL_TABLET | Freq: Every day | ORAL | 0 refills | Status: DC

## 2020-09-14 MED ORDER — LISINOPRIL-HYDROCHLOROTHIAZIDE 10-12.5 MG PO TABS: 1.0000 | ORAL_TABLET | Freq: Every day | ORAL | 0 refills | Status: DC

## 2020-09-14 NOTE — Telephone Encounter (Signed)
Received fax from [facility] for a refill on his Atorvastatin, lisinopril/hctz, and Levothyroxine pt. Last apt was 08/29/20 and next apt 12/05/20.

## 2020-09-14 NOTE — Telephone Encounter (Signed)
Med fill and pt advised [facility]

## 2020-09-20 ENCOUNTER — Other Ambulatory Visit: Payer: Self-pay | Admitting: Family Medicine

## 2020-09-20 DIAGNOSIS — E039 Hypothyroidism, unspecified: Secondary | ICD-10-CM

## 2020-09-29 ENCOUNTER — Ambulatory Visit: Payer: 59 | Admitting: Medical

## 2020-09-29 ENCOUNTER — Other Ambulatory Visit: Payer: Self-pay

## 2020-09-29 ENCOUNTER — Encounter: Payer: Self-pay | Admitting: Family Medicine

## 2020-09-29 ENCOUNTER — Encounter: Payer: Self-pay | Admitting: Medical

## 2020-09-29 VITALS — BP 126/88 | HR 90 | Temp 98.2°F | Ht 71.0 in | Wt 251.8 lb

## 2020-09-29 DIAGNOSIS — M7989 Other specified soft tissue disorders: Secondary | ICD-10-CM | POA: Diagnosis not present

## 2020-09-29 DIAGNOSIS — M19041 Primary osteoarthritis, right hand: Secondary | ICD-10-CM

## 2020-09-29 DIAGNOSIS — M778 Other enthesopathies, not elsewhere classified: Secondary | ICD-10-CM

## 2020-09-29 NOTE — Progress Notes (Signed)
Established patient visit   Patient: Russell Peters   DOB: [birth year]   54 y.o. Male  MRN: [id number] Visit Date: 09/29/2020  Today's healthcare provider: Dorothea Ogle, PA-C   Chief Complaint  Patient presents with  . Hand Pain    right x4 days   I,Russell Peters,acting as a scribe for [employer], PA-C.,have documented all relevant documentation on his behalf,as directed and in the presence of Russell Peters.  Subjective    HPI HPI    Hand Pain     Additional comments: right x4 days        Last edited by Russell Peters, CMA on 09/29/2020  1:35 PM. (History)       Hand Pain Patient presents today for right hand pain that seems to be getting worse. He reports some elbow pain. Patient reports he painted front porch on Saturday, 09/23/20 and then on Monday,09/25/20 saw swelling. He reports on Tuesday,09/26/20 and Wednesday, 09/27/20 placing ice on his hand.  Maternal great uncle have arthritis.    Medications: Outpatient Medications Prior to Visit  Medication Sig  . Accu-Chek Softclix Lancets lancets 1 each by Other route daily. Use as instructed to check blood sugar  . amphetamine-dextroamphetamine (ADDERALL) 30 MG tablet Take 30 mg by mouth daily.  Marland Kitchen atorvastatin (LIPITOR) 20 MG tablet Take 1 tablet (20 mg total) by mouth daily.  Marland Kitchen glucose blood test strip 1 each by Other route as needed for other. Use as instructed to check blood sugar  . levothyroxine (SYNTHROID) 88 MCG tablet TAKE 1 TABLET BY MOUTH EVERY DAY  . lisinopril-hydrochlorothiazide (ZESTORETIC) 10-12.5 MG tablet Take 1 tablet by mouth daily.  Marland Kitchen omeprazole (PRILOSEC) 20 MG capsule Take 1 capsule (20 mg total) by mouth daily.   No facility-administered medications prior to visit.    Review of Systems As in subjective   Objective    BP 126/88   Pulse 90   Temp 98.2 F (36.8 C) (Oral)   Ht 5\' 11"  (1.803 m)   Wt 251 lb 12.8 oz (114.2 kg)   SpO2 94%   BMI 35.12 kg/m    Physical  Exam Constitutional:      Appearance: Normal appearance. He is obese.  Musculoskeletal:     Right elbow: Normal range of motion. Tenderness present.     Right hand: Swelling and tenderness present.  Skin:    General: Skin is warm and dry.  Neurological:     General: No focal deficit present.     Mental Status: He is alert and oriented to person, place, and time.  Psychiatric:        Mood and Affect: Mood normal.        Behavior: Behavior normal.        Assessment & Plan   Encounter Diagnoses  Name Primary?  . Swelling of right hand Yes  . Osteoarthritis of right hand, unspecified osteoarthritis type   . Elbow tendonitis         We discussed his exam findings and possible diagnosis.  There is no obvious sign of redness, warmth, superficial findings suggestive of cellulitis, no fever.  His symptoms suggest right elbow tendinitis mild in right hand MCP arthritis DIP 2nd and 3rd digits.    Patient was advised to ice, rest, and elevate hand up for 20 minutes at a time over the weekend. Also can use a brace and ibuprofen.   Advised ibuprofen over-the-counter 600 mg twice daily through the weekend  He was  concerned about cellulitis or joint infection as his girlfriend had a nasty finger joint infection last year after doing some yard work.  I advised that he currently has no exam findings suggestive of septic joint.  Over the weekend if signs suggestive of infection such as worse pain or swelling in the joint, redness or joint warmth, fever, nausea, inability to bend fingers then get checked out right away.  Russell Peters was seen today for hand pain.  Diagnoses and all orders for this visit:  Swelling of right hand  Osteoarthritis of right hand, unspecified osteoarthritis type  Elbow tendonitis  Follow-up as needed

## 2020-10-06 ENCOUNTER — Other Ambulatory Visit: Payer: Self-pay | Admitting: Medical

## 2020-10-06 ENCOUNTER — Telehealth: Payer: Self-pay | Admitting: Family Medicine

## 2020-10-06 DIAGNOSIS — M7989 Other specified soft tissue disorders: Secondary | ICD-10-CM

## 2020-10-06 DIAGNOSIS — M25541 Pain in joints of right hand: Secondary | ICD-10-CM

## 2020-10-06 NOTE — Telephone Encounter (Signed)
I put in xray of hand.   Get up date on symptoms currently?  If warmth, redness, then I need to see the hand. If just not much better and unchanged he can go for hand xray today  Please go to [facility] for your xray.   Their hours are 8am - 4:30 pm Monday - Friday.  Take your insurance card with you.  [facility] [phone]  [address], [place], [place] [address]  [address] [place], [place] [address]

## 2020-10-06 NOTE — Telephone Encounter (Signed)
Pt called and states that he saw [facility] for hand pain. He states he continues to have issues without much improvement. He states maybe time for xray. Please advise pt on what he should do next. Pt can be reached at [phone]

## 2020-10-09 ENCOUNTER — Other Ambulatory Visit: Payer: Self-pay

## 2020-10-09 ENCOUNTER — Telehealth: Payer: Self-pay | Admitting: Medical

## 2020-10-09 ENCOUNTER — Ambulatory Visit
Admission: RE | Admit: 2020-10-09 | Discharge: 2020-10-09 | Disposition: A | Discharge status: Home / Self Care | Payer: 59 | Source: Ambulatory Visit | Attending: Medical | Admitting: Medical

## 2020-10-09 DIAGNOSIS — M7989 Other specified soft tissue disorders: Secondary | ICD-10-CM

## 2020-10-09 DIAGNOSIS — M25541 Pain in joints of right hand: Secondary | ICD-10-CM

## 2020-10-09 NOTE — Telephone Encounter (Signed)
Message has been sent to patient via mychart  

## 2020-10-09 NOTE — Telephone Encounter (Signed)
Left detail message on machine asking patient to call and give update on his symptoms.

## 2020-10-09 NOTE — Telephone Encounter (Signed)
As of 10:00 PM Monday night I still do not have x-ray results.  Please call [facility] first thing in the morning and find out why there is a delay in reporting recently.  I need to see his results  Let him know about the hold-up

## 2020-10-10 ENCOUNTER — Encounter: Payer: Self-pay | Admitting: Family Medicine

## 2020-10-10 ENCOUNTER — Ambulatory Visit: Payer: 59 | Admitting: Family Medicine

## 2020-10-10 VITALS — BP 140/86 | HR 83 | Temp 97.1°F | Wt 250.2 lb

## 2020-10-10 DIAGNOSIS — M659 Synovitis and tenosynovitis, unspecified: Secondary | ICD-10-CM

## 2020-10-10 DIAGNOSIS — M7989 Other specified soft tissue disorders: Secondary | ICD-10-CM

## 2020-10-10 DIAGNOSIS — M65949 Unspecified synovitis and tenosynovitis, unspecified hand: Secondary | ICD-10-CM

## 2020-10-10 NOTE — Telephone Encounter (Signed)
Results are in

## 2020-10-10 NOTE — Telephone Encounter (Signed)
I sent a reply to him and you today regarding the results and need to have follow-up with Dr. Redmond School

## 2020-10-10 NOTE — Progress Notes (Signed)
   Subjective:    Patient ID: Russell Peters, male    DOB: [birth year], 54 y.o.   MRN: [id number]  HPI He is here for consult concerning continued difficulty with swelling of the right third MCP.  No other joints are involved in his hand.  No pain or swelling of his wrist, elbows, knees or ankles.  Recent x-rays were negative.  He has been taking 3 ibuprofen daily.   Review of Systems     Objective:   Physical Exam Exam of the right third MCP does show some swelling but it is not hot or tender.  No swelling noted in other joints of the fingers, wrists, elbows, knees or ankles.       Assessment & Plan:  Synovitis of finger  Swelling of right hand Claimed that I did not think that this was a systemic illness.  Discussed synovitis with him.  I will get more aggressive with 2 Aleve twice per day for the next several weeks.  If no improvement.  May consider sending him to rheumatology.

## 2020-10-10 NOTE — Patient Instructions (Signed)
Take 2 Aleve twice per day for the next couple of weeks and see if that quiets it down.  If you are still having difficulty call me and then I will make a referral

## 2020-10-31 ENCOUNTER — Ambulatory Visit: Payer: 59

## 2020-10-31 ENCOUNTER — Ambulatory Visit: Payer: 59 | Admitting: Podiatry

## 2020-10-31 ENCOUNTER — Encounter: Payer: Self-pay | Admitting: Podiatry

## 2020-10-31 ENCOUNTER — Other Ambulatory Visit: Payer: Self-pay

## 2020-10-31 DIAGNOSIS — L989 Disorder of the skin and subcutaneous tissue, unspecified: Secondary | ICD-10-CM | POA: Diagnosis not present

## 2020-10-31 DIAGNOSIS — B079 Viral wart, unspecified: Secondary | ICD-10-CM | POA: Diagnosis not present

## 2020-10-31 DIAGNOSIS — L570 Actinic keratosis: Secondary | ICD-10-CM

## 2020-10-31 DIAGNOSIS — M778 Other enthesopathies, not elsewhere classified: Secondary | ICD-10-CM

## 2020-10-31 DIAGNOSIS — Q828 Other specified congenital malformations of skin: Secondary | ICD-10-CM | POA: Diagnosis not present

## 2020-11-04 NOTE — Progress Notes (Signed)
Subjective:  Patient ID: Russell Peters, male    DOB: [birth year],  MRN: [id number]  Chief Complaint  Patient presents with   Foot Pain    Sub 5th met right - tiny, callused area x 2 months, tender with walking now, no treatment   New Patient (Initial Visit)    54 y.o. male presents with the above complaint. History confirmed with patient.   Objective:  Physical Exam: warm, good capillary refill, no trophic changes or ulcerative lesions, normal DP and PT pulses and normal sensory exam.   Right Foot: Porokeratosis submet 5 right    Assessment:   1. Porokeratosis   2. Benign skin lesion excluding plantar wart      Plan:  Patient was evaluated and treated and all questions answered.  Discussed with him the etiology and treatment options for skin lesion such as this.  I think this is likely a porokeratosis.  I debrided the porokeratosis thoroughly and applied salinocaine ointment to destroy the lesion and occlusive dressing with an aperture pad.  Recommended use salicylic acid treatment at home.  No follow-ups on file.

## 2020-12-05 ENCOUNTER — Ambulatory Visit: Payer: 59 | Admitting: Family Medicine

## 2020-12-05 ENCOUNTER — Encounter: Payer: Self-pay | Admitting: Family Medicine

## 2020-12-05 ENCOUNTER — Other Ambulatory Visit: Payer: Self-pay

## 2020-12-05 VITALS — BP 138/94 | HR 87 | Temp 97.3°F | Wt 243.6 lb

## 2020-12-05 DIAGNOSIS — F988 Other specified behavioral and emotional disorders with onset usually occurring in childhood and adolescence: Secondary | ICD-10-CM | POA: Diagnosis not present

## 2020-12-05 DIAGNOSIS — E1169 Type 2 diabetes mellitus with other specified complication: Secondary | ICD-10-CM

## 2020-12-05 DIAGNOSIS — E119 Type 2 diabetes mellitus without complications: Secondary | ICD-10-CM | POA: Insufficient documentation

## 2020-12-05 DIAGNOSIS — E1159 Type 2 diabetes mellitus with other circulatory complications: Secondary | ICD-10-CM

## 2020-12-05 DIAGNOSIS — N522 Drug-induced erectile dysfunction: Secondary | ICD-10-CM | POA: Insufficient documentation

## 2020-12-05 DIAGNOSIS — I152 Hypertension secondary to endocrine disorders: Secondary | ICD-10-CM

## 2020-12-05 DIAGNOSIS — E785 Hyperlipidemia, unspecified: Secondary | ICD-10-CM

## 2020-12-05 DIAGNOSIS — E1165 Type 2 diabetes mellitus with hyperglycemia: Secondary | ICD-10-CM | POA: Insufficient documentation

## 2020-12-05 LAB — POCT GLYCOSYLATED HEMOGLOBIN (HGB A1C): Hemoglobin A1C: 6 % — AB (ref 4.0–5.6)

## 2020-12-05 MED ORDER — LISINOPRIL-HYDROCHLOROTHIAZIDE 20-12.5 MG PO TABS: 1.0000 | ORAL_TABLET | Freq: Every day | ORAL | 3 refills | Status: DC

## 2020-12-05 MED ORDER — TADALAFIL 5 MG PO TABS: 5.0000 mg | ORAL_TABLET | Freq: Every day | ORAL | 0 refills | Status: DC | PRN

## 2020-12-05 NOTE — Progress Notes (Signed)
  Subjective:    Patient ID: Russell Peters, male    DOB: 12/13/1966, 54 y.o.   MRN: [id number]  Russell Peters is a 54 y.o. male who presents for follow-up of Type 2 diabetes mellitus.  Home blood sugar records: meter records , post meal and fasting , 82- 260 Current symptoms/problems include none at this time. Daily foot checks: yes   Any foot concerns: none  Exercise: staying active with walking on a daily basis. Diet: good he has made diet changes in terms of cutting back on carbohydrates. He continues on atorvastatin as well as lisinopril/HCTZ.  He is now having some difficulty with erectile dysfunction and would like some help with that.  He does have underlying ADHD and will eventually need a refill on his medications.  He would like to come to me for that. The following portions of the patient's history were reviewed and updated as appropriate: allergies, current medications, past medical history, past social history and problem list.  ROS as in subjective above.     Objective:    Physical Exam Alert and in no distress otherwise not examined. His weight is down. Hemoglobin A1c is 6.0 Lab Review Diabetic Labs Latest Ref Rng & Units 07/17/2020 01/10/2020 03/11/2019 03/19/2018 02/04/2017  HbA1c 4.8 - 5.6 % 7.2(H) - - - 6.2(H)  Chol 100 - 199 mg/dL 177 230(H) 221(H) 209(H) 193  HDL >39 mg/dL 53 48 41 57 41  Calc LDL 0 - 99 mg/dL 113(H) 167(H) 165(H) 134(H) 134(H)  Triglycerides 0 - 149 mg/dL 55 84 75 92 91  Creatinine 0.76 - 1.27 mg/dL 1.01 - 1.15 0.99 1.12   BP/Weight 10/10/2020 09/29/2020 08/29/2020 07/24/2020 9/93/5701  Systolic BP 779 390 300 923 300  Diastolic BP 86 88 96 88 92  Wt. (Lbs) 250.2 251.8 259 269.8 268.8  BMI 34.9 35.12 36.12 37.63 37.49  Some encounter information is confidential and restricted. Go to Review Flowsheets activity to see all data.    Russell Peters  reports that he quit smoking about 21 years ago. He quit after 5.00 years of use. He has never used  smokeless tobacco. He reports current alcohol use. He reports that he does not use drugs.     Assessment & Plan:    Attention deficit disorder, unspecified hyperactivity presence  Drug-induced erectile dysfunction - Plan: tadalafil (CIALIS) 5 MG tablet  Hypertension associated with type 2 diabetes mellitus ([place]) - Plan: lisinopril-hydrochlorothiazide (ZESTORETIC) 20-12.5 MG tablet  Hyperlipidemia associated with type 2 diabetes mellitus ([place]) - Plan: Lipid panel  Type 2 diabetes mellitus with other specified complication, without long-term current use of insulin ([facility]) - Plan: POCT glycosylated hemoglobin (Hb A1C)   1. Rx changes: none 2. Education: Reviewed 'ABCs' of diabetes management (respective goals in parentheses):  A1C (<7), blood pressure (<130/80), and cholesterol (LDL <100). 3. Compliance at present is estimated to be excellent. Efforts to improve compliance (if necessary) will be directed at Continue with present diet and exercise.. 4. Follow up: 4 months I also told him that I would write for his ADD med when he needs it.  I congratulated him on the work that he is doing to reduce his weight. Discussed the possibility of being relabeled as diabetes in remission if he continues to do a good job.

## 2020-12-06 LAB — LIPID PANEL
Chol/HDL Ratio: 2.8 ratio (ref 0.0–5.0)
Cholesterol, Total: 159 mg/dL (ref 100–199)
HDL: 57 mg/dL (ref >39)
LDL Chol Calc (NIH): 90 mg/dL (ref 0–99)
Triglycerides: 57 mg/dL (ref 0–149)
VLDL Cholesterol Cal: 12 mg/dL (ref 5–40)

## 2020-12-06 MED ORDER — ATORVASTATIN CALCIUM 40 MG PO TABS
40.0000 mg | ORAL_TABLET | Freq: Every day | ORAL | 3 refills | Status: DC
Start: 2020-12-06 — End: 2021-06-27

## 2020-12-06 NOTE — Addendum Note (Signed)
Addended by: Denita Lung on: 12/06/2020 11:37 AM   Modules accepted: Orders

## 2020-12-07 ENCOUNTER — Encounter: Payer: Self-pay | Admitting: Family Medicine

## 2020-12-07 DIAGNOSIS — N522 Drug-induced erectile dysfunction: Secondary | ICD-10-CM

## 2020-12-07 MED ORDER — AMPHETAMINE-DEXTROAMPHETAMINE 30 MG PO TABS: 30.0000 mg | ORAL_TABLET | Freq: Two times a day (BID) | ORAL | 0 refills | Status: DC

## 2020-12-07 MED ORDER — TADALAFIL 5 MG PO TABS: 5.0000 mg | ORAL_TABLET | Freq: Every day | ORAL | 5 refills | Status: DC | PRN

## 2020-12-23 ENCOUNTER — Other Ambulatory Visit: Payer: Self-pay | Admitting: Family Medicine

## 2020-12-23 DIAGNOSIS — E039 Hypothyroidism, unspecified: Secondary | ICD-10-CM

## 2021-01-07 ENCOUNTER — Other Ambulatory Visit: Payer: Self-pay | Admitting: Family Medicine

## 2021-01-07 DIAGNOSIS — E785 Hyperlipidemia, unspecified: Secondary | ICD-10-CM

## 2021-01-21 ENCOUNTER — Other Ambulatory Visit: Payer: Self-pay | Admitting: Family Medicine

## 2021-01-22 MED ORDER — AMPHETAMINE-DEXTROAMPHETAMINE 30 MG PO TABS: 30.0000 mg | ORAL_TABLET | Freq: Two times a day (BID) | ORAL | 0 refills | Status: DC

## 2021-01-22 NOTE — Telephone Encounter (Signed)
[facility] Please advise [facility]

## 2021-02-23 ENCOUNTER — Other Ambulatory Visit: Payer: Self-pay | Admitting: Family Medicine

## 2021-02-23 MED ORDER — AMPHETAMINE-DEXTROAMPHETAMINE 30 MG PO TABS: 30.0000 mg | ORAL_TABLET | Freq: Two times a day (BID) | ORAL | 0 refills | Status: DC

## 2021-02-23 NOTE — Telephone Encounter (Signed)
Pt. Requesting refill on his adderall pt. Last apt was 12/05/20.

## 2021-03-20 ENCOUNTER — Other Ambulatory Visit: Payer: Self-pay | Admitting: Family Medicine

## 2021-03-20 DIAGNOSIS — E039 Hypothyroidism, unspecified: Secondary | ICD-10-CM

## 2021-05-09 ENCOUNTER — Encounter: Payer: Self-pay | Admitting: Family Medicine

## 2021-05-23 ENCOUNTER — Encounter: Payer: Self-pay | Admitting: Family Medicine

## 2021-05-25 ENCOUNTER — Other Ambulatory Visit: Payer: Self-pay | Admitting: Family Medicine

## 2021-05-25 DIAGNOSIS — I10 Essential (primary) hypertension: Secondary | ICD-10-CM

## 2021-06-12 ENCOUNTER — Other Ambulatory Visit: Payer: Self-pay | Admitting: Family Medicine

## 2021-06-12 DIAGNOSIS — E039 Hypothyroidism, unspecified: Secondary | ICD-10-CM

## 2021-06-13 ENCOUNTER — Telehealth: Payer: Self-pay

## 2021-06-13 MED ORDER — AMPHETAMINE-DEXTROAMPHETAMINE 30 MG PO TABS: 30.0000 mg | ORAL_TABLET | Freq: Two times a day (BID) | ORAL | 0 refills | Status: DC

## 2021-06-13 MED ORDER — LEVOTHYROXINE SODIUM 88 MCG PO TABS: 88.0000 ug | ORAL_TABLET | Freq: Every day | ORAL | 0 refills | Status: DC

## 2021-06-13 NOTE — Telephone Encounter (Signed)
[facility] Please advise [facility]

## 2021-06-13 NOTE — Telephone Encounter (Signed)
LVM for pt to call back and schedule a med check  appt. Datil

## 2021-06-15 ENCOUNTER — Telehealth: Payer: Self-pay

## 2021-06-15 ENCOUNTER — Other Ambulatory Visit: Payer: Self-pay

## 2021-06-15 DIAGNOSIS — E039 Hypothyroidism, unspecified: Secondary | ICD-10-CM

## 2021-06-15 MED ORDER — AMPHETAMINE-DEXTROAMPHETAMINE 30 MG PO TABS: 30.0000 mg | ORAL_TABLET | Freq: Two times a day (BID) | ORAL | 0 refills | Status: DC

## 2021-06-15 MED ORDER — LEVOTHYROXINE SODIUM 88 MCG PO TABS: 88.0000 ug | ORAL_TABLET | Freq: Every day | ORAL | 0 refills | Status: DC

## 2021-06-15 NOTE — Telephone Encounter (Signed)
Pt. Called stating he needs his adderall switched to [facility] on [address]. [facility]

## 2021-06-16 ENCOUNTER — Other Ambulatory Visit: Payer: Self-pay | Admitting: Family Medicine

## 2021-06-16 DIAGNOSIS — E039 Hypothyroidism, unspecified: Secondary | ICD-10-CM

## 2021-06-26 ENCOUNTER — Other Ambulatory Visit: Payer: Self-pay

## 2021-06-26 ENCOUNTER — Ambulatory Visit: Payer: 59 | Admitting: Family Medicine

## 2021-06-26 ENCOUNTER — Encounter: Payer: Self-pay | Admitting: Family Medicine

## 2021-06-26 VITALS — BP 130/88 | HR 89 | Temp 98.0°F | Ht 71.0 in | Wt 243.2 lb

## 2021-06-26 DIAGNOSIS — I152 Hypertension secondary to endocrine disorders: Secondary | ICD-10-CM

## 2021-06-26 DIAGNOSIS — E1169 Type 2 diabetes mellitus with other specified complication: Secondary | ICD-10-CM | POA: Diagnosis not present

## 2021-06-26 DIAGNOSIS — Z23 Encounter for immunization: Secondary | ICD-10-CM | POA: Diagnosis not present

## 2021-06-26 DIAGNOSIS — E669 Obesity, unspecified: Secondary | ICD-10-CM

## 2021-06-26 DIAGNOSIS — F988 Other specified behavioral and emotional disorders with onset usually occurring in childhood and adolescence: Secondary | ICD-10-CM | POA: Diagnosis not present

## 2021-06-26 DIAGNOSIS — E1159 Type 2 diabetes mellitus with other circulatory complications: Secondary | ICD-10-CM | POA: Diagnosis not present

## 2021-06-26 DIAGNOSIS — E039 Hypothyroidism, unspecified: Secondary | ICD-10-CM

## 2021-06-26 DIAGNOSIS — Z86010 Personal history of colonic polyps: Secondary | ICD-10-CM

## 2021-06-26 DIAGNOSIS — E785 Hyperlipidemia, unspecified: Secondary | ICD-10-CM

## 2021-06-26 LAB — POCT GLYCOSYLATED HEMOGLOBIN (HGB A1C): Hemoglobin A1C: 6 % — AB (ref 4.0–5.6)

## 2021-06-26 NOTE — Progress Notes (Signed)
  Subjective:    Patient ID: Russell Peters, male    DOB: November 18, 1966, 55 y.o.   MRN: [id number]  Russell Peters is a 55 y.o. male who presents for follow-up of Type 2 diabetes mellitus.  Home blood sugar records:  fasting and post meal , meter record , average 133. Current symptoms/problems include none and have been stable. Daily foot checks:yes   Any foot concerns: none Exercise: walking 30 min QD Diet: fair He exercises regularly and has made some dietary changes.  He is now living with his girlfriend and therefore his diet has changed.  That relationship seems to be going well.  He continues on his thyroid medicine and is having no difficulty with that.  He does have underlying ADD and is taking Adderall.  1 tablet usually lasts 4 to 6 hours and he sometimes skips the afternoon dose due to not needing it and also concerned about sleep issues.  Presently he is not on any diabetes mediated medications.  Continues on lisinopril/HCTZ without difficulty.  He did have a recent colonoscopy which did show adenomatous polyps and is scheduled for repeat in 2024 The following portions of the patient's history were reviewed and updated as appropriate: allergies, current medications, past medical history, past social history and problem list.  ROS as in subjective above.     Objective:    Physical Exam Alert and in no distress otherwise not examined.  Hemoglobin A1c is 6.0 Lab Review Diabetic Labs Latest Ref Rng & Units 12/05/2020 07/17/2020 01/10/2020 03/11/2019 03/19/2018  HbA1c 4.0 - 5.6 % 6.0(A) 7.2(H) - - -  Chol 100 - 199 mg/dL 159 177 230(H) 221(H) 209(H)  HDL >39 mg/dL 57 53 48 41 57  Calc LDL 0 - 99 mg/dL 90 113(H) 167(H) 165(H) 134(H)  Triglycerides 0 - 149 mg/dL 57 55 84 75 92  Creatinine 0.76 - 1.27 mg/dL - 1.01 - 1.15 0.99   BP/Weight 06/26/2021 12/05/2020 10/10/2020 59/08/3569 12/29/7937  Systolic BP 030 092 330 076 226  Diastolic BP 88 94 86 88 96  Wt. (Lbs) 243.2 243.6 250.2 251.8  259  BMI 33.92 33.98 34.9 35.12 36.12  Some encounter information is confidential and restricted. Go to Review Flowsheets activity to see all data.    Russell Peters  reports that he quit smoking about 22 years ago. His smoking use included cigarettes. He has never used smokeless tobacco. He reports current alcohol use. He reports that he does not use drugs.     Assessment & Plan:    Type 2 diabetes mellitus with other specified complication, without long-term current use of insulin ([place]) - Plan: Moderna Covid-19 Booster, POCT glycosylated hemoglobin (Hb A1C)  Hypothyroidism, unspecified type - Plan: TSH  Attention deficit disorder, unspecified hyperactivity presence  Hyperlipidemia associated with type 2 diabetes mellitus ([place]) - Plan: Lipid panel  Hypertension associated with type 2 diabetes mellitus ([place]) - Plan: CBC with Differential/Platelet, Comprehensive metabolic panel  Obesity, Class II, BMI 35-39.9  Hx of adenomatous colonic polyps  Need for vaccination against Streptococcus pneumoniae - Plan: Pneumococcal conjugate vaccine 20-valent (Prevnar 20)  Rx changes: none Education: Reviewed 'ABCs' of diabetes management (respective goals in parentheses):  A1C (<7), blood pressure (<130/80), and cholesterol (LDL <100). Compliance at present is estimated to be excellent. Efforts to improve compliance (if necessary) will be directed at  no change . Follow up: 6 months

## 2021-06-27 LAB — CBC WITH DIFFERENTIAL/PLATELET
Basophils Absolute: 0 10*3/uL (ref 0.0–0.2)
Basos: 1 %
EOS (ABSOLUTE): 0.3 10*3/uL (ref 0.0–0.4)
Eos: 4 %
Hematocrit: 46.6 % (ref 37.5–51.0)
Hemoglobin: 16.3 g/dL (ref 13.0–17.7)
Immature Grans (Abs): 0 10*3/uL (ref 0.0–0.1)
Immature Granulocytes: 0 %
Lymphocytes Absolute: 2.5 10*3/uL (ref 0.7–3.1)
Lymphs: 39 %
MCH: 32.9 pg (ref 26.6–33.0)
MCHC: 35 g/dL (ref 31.5–35.7)
MCV: 94 fL (ref 79–97)
Monocytes Absolute: 0.7 10*3/uL (ref 0.1–0.9)
Monocytes: 11 %
Neutrophils Absolute: 2.9 10*3/uL (ref 1.4–7.0)
Neutrophils: 45 %
Platelets: 271 10*3/uL (ref 150–450)
RBC: 4.96 x10E6/uL (ref 4.14–5.80)
RDW: 12.8 % (ref 11.6–15.4)
WBC: 6.4 10*3/uL (ref 3.4–10.8)

## 2021-06-27 LAB — COMPREHENSIVE METABOLIC PANEL
ALT: 14 IU/L (ref 0–44)
AST: 13 IU/L (ref 0–40)
Albumin/Globulin Ratio: 2.4 — ABNORMAL HIGH (ref 1.2–2.2)
Albumin: 4.7 g/dL (ref 3.8–4.9)
Alkaline Phosphatase: 58 IU/L (ref 44–121)
BUN/Creatinine Ratio: 15 (ref 9–20)
BUN: 15 mg/dL (ref 6–24)
Bilirubin Total: 0.5 mg/dL (ref 0.0–1.2)
CO2: 25 mmol/L (ref 20–29)
Calcium: 9.6 mg/dL (ref 8.7–10.2)
Chloride: 102 mmol/L (ref 96–106)
Creatinine, Ser: 0.98 mg/dL (ref 0.76–1.27)
Globulin, Total: 2 g/dL (ref 1.5–4.5)
Glucose: 128 mg/dL — ABNORMAL HIGH (ref 65–99)
Potassium: 4.8 mmol/L (ref 3.5–5.2)
Sodium: 139 mmol/L (ref 134–144)
Total Protein: 6.7 g/dL (ref 6.0–8.5)
eGFR: 92 mL/min/{1.73_m2} (ref >59)

## 2021-06-27 LAB — LIPID PANEL
Chol/HDL Ratio: 4.2 ratio (ref 0.0–5.0)
Cholesterol, Total: 214 mg/dL — ABNORMAL HIGH (ref 100–199)
HDL: 51 mg/dL (ref >39)
LDL Chol Calc (NIH): 139 mg/dL — ABNORMAL HIGH (ref 0–99)
Triglycerides: 134 mg/dL (ref 0–149)
VLDL Cholesterol Cal: 24 mg/dL (ref 5–40)

## 2021-06-27 LAB — TSH: TSH: 2.88 u[IU]/mL (ref 0.450–4.500)

## 2021-06-27 MED ORDER — ROSUVASTATIN CALCIUM 40 MG PO TABS: 40.0000 mg | ORAL_TABLET | Freq: Every day | ORAL | 3 refills | Status: DC

## 2021-06-27 NOTE — Addendum Note (Signed)
Addended by: Denita Lung on: 06/27/2021 08:07 AM   Modules accepted: Orders

## 2021-06-28 NOTE — Progress Notes (Signed)
Called pt and mailed letter due to no  answer. Russell Peters

## 2021-06-28 NOTE — Progress Notes (Signed)
Called and no answer and will mail letter. [place]

## 2021-07-12 ENCOUNTER — Other Ambulatory Visit: Payer: Self-pay

## 2021-07-12 MED ORDER — ROSUVASTATIN CALCIUM 40 MG PO TABS
40.0000 mg | ORAL_TABLET | Freq: Every day | ORAL | 3 refills | Status: DC
Start: 2021-07-12 — End: 2021-08-07

## 2021-07-31 ENCOUNTER — Encounter: Payer: Self-pay | Admitting: Family Medicine

## 2021-08-06 ENCOUNTER — Encounter: Payer: Self-pay | Admitting: Family Medicine

## 2021-08-07 ENCOUNTER — Telehealth: Payer: Self-pay

## 2021-08-07 ENCOUNTER — Other Ambulatory Visit: Payer: Self-pay

## 2021-08-07 MED ORDER — ATORVASTATIN CALCIUM 20 MG PO TABS: 20.0000 mg | ORAL_TABLET | Freq: Every day | ORAL | 3 refills | Status: DC

## 2021-08-07 NOTE — Addendum Note (Signed)
Addended by: Denita Lung on: 08/07/2021 03:58 PM   Modules accepted: Orders

## 2021-08-07 NOTE — Telephone Encounter (Signed)
Pt is not sure what strength of lipitor he is taking. He is sure it is lipitor though. Pt would like to start with the 20 mg but last dose was d/c at 40 mg. Please advise if the 20 mg can be sent in per his rerquest. [facility]

## 2021-09-12 ENCOUNTER — Encounter: Payer: Self-pay | Admitting: Family Medicine

## 2021-09-17 ENCOUNTER — Other Ambulatory Visit: Payer: Self-pay | Admitting: Family Medicine

## 2021-09-17 DIAGNOSIS — E039 Hypothyroidism, unspecified: Secondary | ICD-10-CM

## 2021-09-18 MED ORDER — LEVOTHYROXINE SODIUM 88 MCG PO TABS: 88.0000 ug | ORAL_TABLET | Freq: Every day | ORAL | 2 refills | Status: DC

## 2021-09-24 ENCOUNTER — Telehealth: Payer: Self-pay | Admitting: Family Medicine

## 2021-09-24 ENCOUNTER — Telehealth: Payer: Self-pay | Admitting: Physical Medicine and Rehabilitation

## 2021-09-24 DIAGNOSIS — E1159 Type 2 diabetes mellitus with other circulatory complications: Secondary | ICD-10-CM

## 2021-09-24 MED ORDER — ATORVASTATIN CALCIUM 20 MG PO TABS: 20.0000 mg | ORAL_TABLET | Freq: Every day | ORAL | 3 refills | Status: DC

## 2021-09-24 MED ORDER — LISINOPRIL-HYDROCHLOROTHIAZIDE 20-12.5 MG PO TABS: 1.0000 | ORAL_TABLET | Freq: Every day | ORAL | 3 refills | Status: DC

## 2021-09-24 NOTE — Telephone Encounter (Signed)
Pt called requesting a call back to set appt for left arm pains. Seen Dr. Ernestina Patches couple years ago. Please call pt to set appt at [phone].

## 2021-09-24 NOTE — Telephone Encounter (Signed)
Pt stopped by and needs a refill on Atorvastatin and Lisinopril-hydrochlorothiazide sent to [facility]

## 2021-09-25 NOTE — Telephone Encounter (Signed)
Scheduled for OV. 

## 2021-09-26 ENCOUNTER — Encounter: Payer: Self-pay | Admitting: Physical Medicine and Rehabilitation

## 2021-09-26 ENCOUNTER — Other Ambulatory Visit: Payer: Self-pay

## 2021-09-26 ENCOUNTER — Ambulatory Visit: Payer: 59 | Admitting: Physical Medicine and Rehabilitation

## 2021-09-26 VITALS — BP 147/97 | HR 88

## 2021-09-26 DIAGNOSIS — M5412 Radiculopathy, cervical region: Secondary | ICD-10-CM | POA: Diagnosis not present

## 2021-09-26 DIAGNOSIS — G8929 Other chronic pain: Secondary | ICD-10-CM

## 2021-09-26 DIAGNOSIS — G5622 Lesion of ulnar nerve, left upper limb: Secondary | ICD-10-CM

## 2021-09-26 DIAGNOSIS — M25512 Pain in left shoulder: Secondary | ICD-10-CM

## 2021-09-26 DIAGNOSIS — R202 Paresthesia of skin: Secondary | ICD-10-CM | POA: Diagnosis not present

## 2021-09-26 NOTE — Progress Notes (Signed)
Pt state left arm pain. Pt state rising his left arm and sleep at night makes the pain worse. Pt state he takes over the counter pain meds and uses heat to help ease his pain.  Numeric Pain Rating Scale and Functional Assessment Average Pain 8 Pain Right Now 4 My pain is constant, burning, dull, stabbing, tingling, and aching Pain is worse with: standing and some activites Pain improves with: heat/ice and medication   In the last MONTH (on 0-10 scale) has pain interfered with the following?  1. General activity like being  able to carry out your everyday physical activities such as walking, climbing stairs, carrying groceries, or moving a chair?  Rating(7)  2. Relation with others like being able to carry out your usual social activities and roles such as  activities at home, at work and in your community. Rating(8)  3. Enjoyment of life such that you have  been bothered by emotional problems such as feeling anxious, depressed or irritable?  Rating(9)

## 2021-09-26 NOTE — Progress Notes (Signed)
Russell Peters - 55 y.o. male MRN [id number]  Date of birth: [birth year]  Office Visit Note: Visit Date: 09/26/2021 PCP: Denita Lung, MD Referred by: Denita Lung, MD  Subjective: Chief Complaint  Patient presents with   Left Arm - Pain   HPI: Russell Peters is a 55 y.o. male who comes in today for evaluation of chronic, worsening and severe left sided neck and shoulder pain radiating down left arm. Patient also endorses numbness/tingling to left 4th and 5th fingers. Left shoulder pain is the most severe pain. Patient states pain has been chronic for several years. Patient states pain feels similar to when he had issues with left shoulder in 2020. He did receive left glenohumeral injection in 2020, which he reports gave him significant and sustained pain relief until the last several months.  He feels like the pain pattern including the referral pain pattern is very similar to 2020. Patient states pain is exacerbated by lifting and carrying heavy items, describes as soreness/numbness sensation, currently rates as 7 out of 10. Patient reports some pain relief with Ibuprofen and heat. Patient's cervical MRI from 2012 exhibits bilateral disc protrusions at C5-C6, right greater than left causing moderate spinal stenosis. Disc protrusions also noted at C6-C7. I am unable to see surgical notes, however patient reports he had ACDF in 2013 to C5-C6 and C6-C7, which helped to resolve his previous right sided radicular symptoms. Patient states he works at [employer] and has to perform frequent heavy lifting, pushing and pulling. He reports his pain is usually worse after working a full day. Patient states it is increasingly difficult to lift his arm above his head due to severe pain. Patient denies focal weakness, numbness and tingling. Patient denies recent trauma or falls.   We have seen patient in the past for chronic bilateral lower back pain. Patient reports good and sustained relief from  bilateral L5-S1 facet joint injections that we performed in 2019.     Review of Systems  Musculoskeletal:  Positive for joint pain and neck pain.  Neurological:  Negative for focal weakness and weakness.  All other systems reviewed and are negative. Otherwise per HPI.  Assessment & Plan: Visit Diagnoses:    ICD-10-CM   1. Chronic left shoulder pain  M25.512 Ambulatory referral to Physical Medicine Rehab   G89.29     2. Radiculopathy, cervical region  M54.12     3. Paresthesia of skin  R20.2     4. 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 Numbness and tingling noted to left 4th and 5th fingers. Left shoulder pain is most severe. Patient had good and sustained relief from left glenohumeral injection in 2020 until approximately 3 months ago. Patient's clinical presentation and exam are complex. We feel that his symptoms could be related to left shoulder osteoarthritis, possible ulnar nerve entrapment or cervical radiculopathy. We feel the next step is to repeat left glenohumeral injection under fluoroscopic guidance. Depending on how patient does with repeat injection, we would also consider performing left upper extremity NCV with EMG or ordering new cervical MRI imaging.  Radicular type symptoms can be residual from the prior cervical spine issue and surgery and this clearly does not seem to be the biggest issue with him.  No red flag symptoms noted upon exam.    Meds & Orders: No  orders of the defined types were placed in this encounter.   Orders Placed This Encounter  Procedures   Ambulatory referral to Physical Medicine Rehab     Follow-up: Return in about 1 week (around 10/03/2021) for Left gluenohumeral injection.   Procedures: No procedures performed      Clinical History: MRI CERVICAL SPINE WITHOUT CONTRAST   Technique:  Multiplanar and multiecho pulse sequences  of the cervical spine, to include the craniocervical junction and cervicothoracic junction, were obtained according to standard protocol without intravenous contrast.   Comparison: CT 01/30/2011   Findings: Patient motion degrades image quality.   Normal alignment with straightening of the cervical lordosis. Negative for fracture or mass lesion.  Spinal cord is difficult to evaluate due to motion but no definite cord edema is present.   C2-3:  Mild disc degeneration.   C3-4:  Mild disc degeneration.   C4-5:  Disc degeneration with disc bulging and a small central disc protrusion.  No cord deformity or stenosis.   C5-6:  Bilateral disc protrusions are present, right greater than left.  There is flattening of the cord on the right due to disc protrusion.  This causes mild to moderate spinal stenosis.  Mild associated spurring is present.   C6-7:  Shallow broad-based disc protrusion centrally with associated uncinate spurring.  No significant stenosis.   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  This is causing some cord deformity on the right and moderate spinal stenosis.   Shallow disc protrusions C6-7 with associated spondylosis.   Original Report Authenticated By: Truett Perna, M.D.   He reports that he quit smoking about 22 years ago. His smoking use included cigarettes. He has never used smokeless tobacco.  Recent Labs    12/05/20 1307 06/26/21 0909  HGBA1C 6.0* 6.0*    Objective:  VS:  HT:    WT:   BMI:     BP: (!) 147/97  HR:88bpm  TEMP: ( )  RESP:  Physical Exam HENT:     Head: Normocephalic and atraumatic.     Right Ear: Tympanic membrane normal.     Left Ear: Tympanic membrane normal.     Nose: Nose normal.     Mouth/Throat:     Mouth: Mucous membranes are moist.  Eyes:     Pupils: Pupils are equal, round, and reactive to light.  Cardiovascular:     Rate and Rhythm: Normal rate.     Pulses:  Normal pulses.  Pulmonary:     Effort: Pulmonary effort is normal.  Abdominal:     General: Abdomen is flat. There is no distension.  Musculoskeletal:        General: Tenderness present.     Cervical back: Tenderness present.     Comments: Discomfort noted to left side of neck with flexion, extension and side-to-side rotation. Good strength noted to bilateral upper extremities. Sensation intact bilaterally. Tenderness noted upon palpation of trigger points to left trapezius. Negative Hoffman's sign.    Pain noted upon abduction and upward rotation of left shoulder. Negative drop arm test.   Skin:    General: Skin is warm and dry.     Capillary Refill: Capillary refill takes less than 2 seconds.  Neurological:     General: No focal deficit present.     Mental Status: He is alert.  Psychiatric:        Mood and Affect: Mood normal.    Ortho Exam  Imaging:  No results found.  Past Medical/Family/Surgical/Social History: Medications & Allergies reviewed per EMR, new medications updated. Patient Active Problem List   Diagnosis Date Noted   Hyperlipidemia associated with type 2 diabetes mellitus ([place]) 12/05/2020   Drug-induced erectile dysfunction 12/05/2020   Diabetes mellitus ([place]) 12/05/2020   Hx of adenomatous colonic polyps 07/17/2020   Attention deficit disorder 02/04/2017   Gastroesophageal reflux disease 02/04/2017   Family history of heart disease in male family member before age 22 10/12/2015   Depression, major, in remission ([place])    Hypothyroid 05/16/2011   Hypertension 05/16/2011   Obesity, Class II, BMI 35-39.9 05/16/2011   Past Medical History:  Diagnosis Date   ADD (attention deficit disorder)    Allergy    Anxiety    Chronic kidney disease    kidney stone years ago   Depression    GERD (gastroesophageal reflux disease)    Hyperlipidemia    Hypertension    Sleep apnea    wears Cpap   Thyroid disease    Family History  Problem Relation Age of Onset    Rheum arthritis Mother    Esophageal cancer Father    Colon cancer Maternal Grandfather    Rectal cancer Neg Hx    Stomach cancer Neg Hx    Past Surgical History:  Procedure Laterality Date   NECK SURGERY     NO PAST SURGERIES     Social History   Occupational History   Not on file  Tobacco Use   Smoking status: Former    Years: 5.00    Types: Cigarettes    Quit date: 12/23/1998    Years since quitting: 22.7   Smokeless tobacco: Never  Vaping Use   Vaping Use: Never used  Substance and Sexual Activity   Alcohol use: Yes    Comment: 3 beers weekly in past 8 months; 1 pt weekly liquor   Drug use: No   Sexual activity: Not Currently

## 2021-09-27 ENCOUNTER — Encounter: Payer: Self-pay | Admitting: Physical Medicine and Rehabilitation

## 2021-09-27 ENCOUNTER — Ambulatory Visit: Payer: Self-pay

## 2021-09-27 ENCOUNTER — Ambulatory Visit (INDEPENDENT_AMBULATORY_CARE_PROVIDER_SITE_OTHER): Payer: 59 | Admitting: Physical Medicine and Rehabilitation

## 2021-09-27 DIAGNOSIS — M25512 Pain in left shoulder: Secondary | ICD-10-CM | POA: Diagnosis not present

## 2021-09-27 DIAGNOSIS — G8929 Other chronic pain: Secondary | ICD-10-CM

## 2021-09-27 NOTE — Progress Notes (Signed)
Pt state left arm pain. Pt state rising his left arm and sleep at night makes the pain worse. Pt state he takes over the counter pain meds and uses heat to help ease his pain.  Numeric Pain Rating Scale and Functional Assessment Average Pain 5   In the last MONTH (on 0-10 scale) has pain interfered with the following?  1. General activity like being  able to carry out your everyday physical activities such as walking, climbing stairs, carrying groceries, or moving a chair?  Rating(10)  -BT, -Dye Allergies.

## 2021-09-30 MED ORDER — BUPIVACAINE HCL 0.5 % IJ SOLN
3.0000 mL | INTRAMUSCULAR | Status: AC | PRN
  Administered 2021-09-27: 3 mL via INTRA_ARTICULAR

## 2021-09-30 MED ORDER — TRIAMCINOLONE ACETONIDE 40 MG/ML IJ SUSP
60.0000 mg | INTRAMUSCULAR | Status: AC | PRN
  Administered 2021-09-27: 60 mg via INTRA_ARTICULAR

## 2021-09-30 NOTE — Progress Notes (Signed)
Russell Peters - 55 y.o. male MRN [id number]  Date of birth: 04/01/1966  Office Visit Note: Visit Date: 09/27/2021 PCP: Denita Lung, MD Referred by: Denita Lung, MD  Subjective: Chief Complaint  Patient presents with   Left Shoulder - Pain   Left Arm - Pain   HPI:  Russell Peters is a 55 y.o. male who comes in today at the request of Barnet Pall, FNP for planned Left anesthetic glenohumeral arthrogram with fluoroscopic guidance.  The patient has failed conservative care including home exercise, medications, time and activity modification.  This injection will be diagnostic and hopefully therapeutic.  Please see requesting physician notes for further details and justification.   ROS Otherwise per HPI.  Assessment & Plan: Visit Diagnoses:    ICD-10-CM   1. Chronic left shoulder pain  M25.512 XR C-ARM NO REPORT   G89.29       Plan: No additional findings.   Meds & Orders: No orders of the defined types were placed in this encounter.   Orders Placed This Encounter  Procedures   Large Joint Inj   XR C-ARM NO REPORT    Follow-up: Return if symptoms worsen or fail to improve.   Procedures: Large Joint Inj: L glenohumeral on 09/27/2021 9:15 AM Indications: pain and diagnostic evaluation Details: 22 G 3.5 in needle, fluoroscopy-guided anteromedial approach  Arthrogram: No  Medications: 3 mL bupivacaine 0.5 %; 60 mg triamcinolone acetonide 40 MG/ML Outcome: tolerated well, no immediate complications  There was excellent flow of contrast producing a partial arthrogram of the glenohumeral joint. The patient did have relief of symptoms during the anesthetic phase of the injection. Procedure, treatment alternatives, risks and benefits explained, specific risks discussed. Consent was given by the patient. Immediately prior to procedure a time out was called to verify the correct patient, procedure, equipment, support staff and site/side marked as required. Patient was  prepped and draped in the usual sterile fashion.         Clinical History: MRI CERVICAL SPINE WITHOUT CONTRAST   Technique:  Multiplanar and multiecho pulse sequences of the cervical spine, to include the craniocervical junction and cervicothoracic junction, were obtained according to standard protocol without intravenous contrast.   Comparison: CT 01/30/2011   Findings: Patient motion degrades image quality.   Normal alignment with straightening of the cervical lordosis. Negative for fracture or mass lesion.  Spinal cord is difficult to evaluate due to motion but no definite cord edema is present.   C2-3:  Mild disc degeneration.   C3-4:  Mild disc degeneration.   C4-5:  Disc degeneration with disc bulging and a small central disc protrusion.  No cord deformity or stenosis.   C5-6:  Bilateral disc protrusions are present, right greater than left.  There is flattening of the cord on the right due to disc protrusion.  This causes mild to moderate spinal stenosis.  Mild associated spurring is present.   C6-7:  Shallow broad-based disc protrusion centrally with associated uncinate spurring.  No significant stenosis.   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  This is causing some cord deformity on the right and moderate spinal stenosis.   Shallow disc protrusions C6-7 with associated spondylosis.   Original Report Authenticated By: Truett Perna, M.D.     Objective:  VS:  HT:    WT:   BMI:     BP:   HR: bpm  TEMP: ( )  RESP:  Physical Exam   Imaging: No results found.

## 2021-10-16 ENCOUNTER — Encounter: Payer: Self-pay | Admitting: Family Medicine

## 2021-10-16 ENCOUNTER — Other Ambulatory Visit: Payer: Self-pay

## 2021-10-16 ENCOUNTER — Ambulatory Visit: Payer: 59 | Admitting: Family Medicine

## 2021-10-16 VITALS — BP 142/96 | HR 80 | Temp 96.0°F | Wt 241.2 lb

## 2021-10-16 DIAGNOSIS — Z23 Encounter for immunization: Secondary | ICD-10-CM | POA: Diagnosis not present

## 2021-10-16 DIAGNOSIS — L989 Disorder of the skin and subcutaneous tissue, unspecified: Secondary | ICD-10-CM

## 2021-10-16 NOTE — Progress Notes (Signed)
   Subjective:    Patient ID: Russell Peters, male    DOB: Nov 22, 1966, 55 y.o.   MRN: [id number]  HPI He is here for evaluation of a lesion in the mid occipital area that has been present for the last several months.  He states that the scab does not tend to heal.  He did tell his dermatologist several months ago about it and was apparently given a topical cortisone cream.  He has had no effect.  He also has a lesion on the mid parietal area that he is concerned about.   Review of Systems     Objective:   Physical Exam Exam of the occipital area does show a nonhealing lesion.  The edges do not appear raised or waxy.  He also has a lesion in the mid parietal area that is slightly raised.       Assessment & Plan:   Scalp lesion  Need for influenza vaccination - Plan: Flu Vaccine QUAD 6+ mos PF IM (Fluarix Quad PF)  Immunization, viral disease - Plan: Moderna Covid-19 Vaccine Bivalent Booster His immunizations were updated.  I did recommend he go back to the dermatologist as the lesions probably both need to be removed.  Explained that I thought they were skin cancers.

## 2021-10-18 ENCOUNTER — Other Ambulatory Visit: Payer: Self-pay | Admitting: Family Medicine

## 2021-10-19 MED ORDER — AMPHETAMINE-DEXTROAMPHETAMINE 30 MG PO TABS: 30.0000 mg | ORAL_TABLET | Freq: Two times a day (BID) | ORAL | 0 refills | Status: DC

## 2021-10-26 ENCOUNTER — Encounter: Payer: Self-pay | Admitting: Physical Medicine and Rehabilitation

## 2021-11-01 ENCOUNTER — Encounter: Payer: Self-pay | Admitting: Family Medicine

## 2021-11-05 ENCOUNTER — Encounter: Payer: Self-pay | Admitting: Physician Assistant

## 2021-11-05 ENCOUNTER — Ambulatory Visit: Payer: 59 | Admitting: Physician Assistant

## 2021-11-05 ENCOUNTER — Ambulatory Visit: Payer: Self-pay

## 2021-11-05 DIAGNOSIS — M542 Cervicalgia: Secondary | ICD-10-CM | POA: Diagnosis not present

## 2021-11-05 NOTE — Progress Notes (Addendum)
Office Visit Note   Patient: Russell Peters           Date of Birth: 1966-04-15           MRN: [id number] Visit Date: 11/05/2021              Requested by: Denita Lung, MD [place],  [place] [id number] PCP: Denita Lung, MD   Assessment & Plan: Visit Diagnoses:  1. Neck pain     Plan: Given patient's radicular symptoms down the left arm recommend repeat MRI cervical spine to rule out HNP as a source of his pain.  He will work on figuring out who performed his cervical spine surgery before retiring after the MRI to go over the results and discuss further treatment.  Questions were encouraged and answered at length today. Addendum patient's neurosurgeon was Dr. Trenton Gammon, [facility]ie [facility] . Follow-Up Instructions: Return After MRI.   Orders:  Orders Placed This Encounter  Procedures   XR Cervical Spine 2 or 3 views   No orders of the defined types were placed in this encounter.     Procedures: No procedures performed   Clinical Data: No additional findings.   Subjective: Chief Complaint  Patient presents with   Left Shoulder - Pain    HPI [name] is a 55 year old male who is referred due to left shoulder and left upper extremity pain by Dr. Ernestina Patches.  He is someone I saw in 2019 for low back pain.  Been seeing Dr. Ernestina Patches for left shoulder and neck pain and actually underwent an intra-articular injection of left shoulder in early October of this year.  The injection gave him 50% relief for about 2 weeks.  He had similar shoulder pain in 2020 and was given left shoulder intra-articular injection given sustained relief for some time.  He describes left neck pain that radiates down his arm into the ulnar distribution of the left hand.  Also notes decreased range of motion and weakness of the left shoulder.  History of cervical spine surgery with fusion of C5-C6 and C6-C7.  He is unsure of when exactly and who performed the surgery on his neck.  He has had no new  injuries.   Review of Systems  Constitutional:  Negative for chills and fever.  Musculoskeletal:  Positive for neck pain.    Objective: Vital Signs: There were no vitals taken for this visit.  Physical Exam Constitutional:      Appearance: He is not ill-appearing or diaphoretic.  Pulmonary:     Effort: Pulmonary effort is normal.  Neurological:     Mental Status: He is alert and oriented to person, place, and time.  Psychiatric:        Mood and Affect: Mood normal.    Ortho Exam Cervical spine is full flexion extension of cervical spine without significant pain.  He has pain with rotation cervical spine left and right.  Spurling's is negative bilaterally.  He has tenderness left scapular medial border and left trapezius region.  Sensation grossly intact bilateral hands light touch.  He has full motor both hands.  Upper extremity strength testing reveals 5 out of 5 strength throughout except for slight weakness with liftoff on the left.  Impingement testing bilateral shoulders negative.  He is somewhat apprehensive with motion of his left shoulder at first but once shoulders been moved passively his apprehension subsides.  Specialty Comments:  No specialty comments available.  Imaging: XR Cervical Spine 2 or 3  views  Result Date: 11/05/2021 Cervical spine 2 views: Status post C5-C6 C6-C7 fusion without hardware failure.  Loss of lordotic curvature.  Anterior endplate spurring at N2-T5 and C4-C5.  No acute fractures or acute findings otherwise.    PMFS History: Patient Active Problem List   Diagnosis Date Noted   Hyperlipidemia associated with type 2 diabetes mellitus ([place]) 12/05/2020   Drug-induced erectile dysfunction 12/05/2020   Diabetes mellitus ([place]) 12/05/2020   Hx of adenomatous colonic polyps 07/17/2020   Attention deficit disorder 02/04/2017   Gastroesophageal reflux disease 02/04/2017   Family history of heart disease in male family member before age 70 10/12/2015    Depression, major, in remission ([place])    Hypothyroid 05/16/2011   Hypertension 05/16/2011   Obesity, Class II, BMI 35-39.9 05/16/2011   Past Medical History:  Diagnosis Date   ADD (attention deficit disorder)    Allergy    Anxiety    Chronic kidney disease    kidney stone years ago   Depression    GERD (gastroesophageal reflux disease)    Hyperlipidemia    Hypertension    Sleep apnea    wears Cpap   Thyroid disease     Family History  Problem Relation Age of Onset   Rheum arthritis Mother    Esophageal cancer Father    Colon cancer Maternal Grandfather    Rectal cancer Neg Hx    Stomach cancer Neg Hx     Past Surgical History:  Procedure Laterality Date   NECK SURGERY     NO PAST SURGERIES     Social History   Occupational History   Not on file  Tobacco Use   Smoking status: Former    Years: 5.00    Types: Cigarettes    Quit date: 12/23/1998    Years since quitting: 22.8   Smokeless tobacco: Never  Vaping Use   Vaping Use: Never used  Substance and Sexual Activity   Alcohol use: Yes    Comment: 3 beers weekly in past 8 months; 1 pt weekly liquor   Drug use: No   Sexual activity: Not Currently

## 2021-11-05 NOTE — Addendum Note (Signed)
Addended by: Robyne Peers on: 11/05/2021 03:17 PM   Modules accepted: Orders

## 2021-11-20 HISTORY — PX: SKIN BIOPSY: SHX1

## 2021-12-03 ENCOUNTER — Other Ambulatory Visit: Payer: Self-pay

## 2021-12-03 ENCOUNTER — Ambulatory Visit
Admission: RE | Admit: 2021-12-03 | Discharge: 2021-12-03 | Disposition: A | Discharge status: Home / Self Care | Payer: 59 | Source: Ambulatory Visit | Attending: Physician Assistant | Admitting: Physician Assistant

## 2021-12-03 DIAGNOSIS — M542 Cervicalgia: Secondary | ICD-10-CM

## 2021-12-22 ENCOUNTER — Other Ambulatory Visit: Payer: Self-pay | Admitting: Family Medicine

## 2021-12-22 DIAGNOSIS — E039 Hypothyroidism, unspecified: Secondary | ICD-10-CM

## 2022-01-03 HISTORY — PX: PUNCH BIOPSY OF SKIN: SHX6390

## 2022-01-03 HISTORY — PX: SKIN BIOPSY: SHX1

## 2022-01-14 ENCOUNTER — Encounter: Payer: Self-pay | Admitting: Family Medicine

## 2022-01-14 ENCOUNTER — Ambulatory Visit: Payer: 59 | Admitting: Family Medicine

## 2022-01-14 ENCOUNTER — Other Ambulatory Visit: Payer: Self-pay

## 2022-01-14 VITALS — BP 132/86 | HR 91 | Temp 97.7°F | Ht 69.0 in | Wt 240.6 lb

## 2022-01-14 DIAGNOSIS — F988 Other specified behavioral and emotional disorders with onset usually occurring in childhood and adolescence: Secondary | ICD-10-CM

## 2022-01-14 DIAGNOSIS — I10 Essential (primary) hypertension: Secondary | ICD-10-CM

## 2022-01-14 DIAGNOSIS — Z0000 Encounter for general adult medical examination without abnormal findings: Secondary | ICD-10-CM

## 2022-01-14 DIAGNOSIS — K219 Gastro-esophageal reflux disease without esophagitis: Secondary | ICD-10-CM

## 2022-01-14 DIAGNOSIS — F325 Major depressive disorder, single episode, in full remission: Secondary | ICD-10-CM | POA: Diagnosis not present

## 2022-01-14 DIAGNOSIS — E039 Hypothyroidism, unspecified: Secondary | ICD-10-CM

## 2022-01-14 DIAGNOSIS — E669 Obesity, unspecified: Secondary | ICD-10-CM

## 2022-01-14 DIAGNOSIS — E785 Hyperlipidemia, unspecified: Secondary | ICD-10-CM

## 2022-01-14 DIAGNOSIS — E1169 Type 2 diabetes mellitus with other specified complication: Secondary | ICD-10-CM

## 2022-01-14 DIAGNOSIS — N522 Drug-induced erectile dysfunction: Secondary | ICD-10-CM

## 2022-01-14 DIAGNOSIS — Z86010 Personal history of colonic polyps: Secondary | ICD-10-CM

## 2022-01-14 LAB — POCT GLYCOSYLATED HEMOGLOBIN (HGB A1C): Hemoglobin A1C: 6 % — AB (ref 4.0–5.6)

## 2022-01-14 MED ORDER — LEVOTHYROXINE SODIUM 88 MCG PO TABS: 88.0000 ug | ORAL_TABLET | Freq: Every day | ORAL | 1 refills | Status: DC

## 2022-01-14 NOTE — Progress Notes (Signed)
° °  Subjective:    Patient ID: Russell Peters, male    DOB: 01-11-1966, 56 y.o.   MRN: [id number]  HPI He is here for complete examination.  His main concern today is continued difficulty with [place] joint pain.  He has seen PM&R and subsequently has been turned back over to orthopedics and will be seen soon for reevaluation of that.  Otherwise he has been exercising regularly.  Has made some dietary changes.  Does not smoke.  He does usually have 2 alcoholic beverages per day.  He continues on his Synthroid without difficulty.  He also is taking Adderall and gets roughly 7 hours of benefit out of that.  He will occasionally take the second dose if he has a busy day.  Does have a history of colonic polyps did have a colonoscopy in 2021.  Is scheduled for follow-up in 3 years.  Continues on atorvastatin as well as lisinopril.  Rarely needs his Cialis.  Otherwise his family and social history as well as health maintenance immunization was reviewed   Review of Systems  All other systems reviewed and are negative.     Objective:   Physical Exam Alert and in no distress. Tympanic membranes and canals are normal. Pharyngeal area is normal. Neck is supple without adenopathy or thyromegaly. Cardiac exam shows a regular sinus rhythm without murmurs or gallops. Lungs are clear to auscultation. Hemoglobin A1c is 6.0       Assessment & Plan:  Type 2 diabetes mellitus with other specified complication, without long-term current use of insulin ([place]) - Plan: POCT glycosylated hemoglobin (Hb A1C), POCT UA - Microalbumin  Hypertension, unspecified type  Gastroesophageal reflux disease, unspecified whether esophagitis present  Hyperlipidemia associated with type 2 diabetes mellitus ([place]) - Plan: Lipid panel  Hypothyroidism, unspecified type  Depression, major, in remission ([place])  Attention deficit disorder, unspecified hyperactivity presence  Drug-induced erectile dysfunction  Hx of adenomatous colonic  polyps  Obesity, Class II, BMI 35-39.9 I congratulated him on the good work that he is doing.  Plan to recheck all this again in 6 months.  Encouraged him to continue with his diet and exercise and possibly cut down to 1 alcoholic beverage per day to help with his weight.

## 2022-01-15 LAB — LIPID PANEL
Chol/HDL Ratio: 3.1 ratio (ref 0.0–5.0)
Cholesterol, Total: 160 mg/dL (ref 100–199)
HDL: 51 mg/dL (ref >39)
LDL Chol Calc (NIH): 99 mg/dL (ref 0–99)
Triglycerides: 46 mg/dL (ref 0–149)
VLDL Cholesterol Cal: 10 mg/dL (ref 5–40)

## 2022-01-15 MED ORDER — ROSUVASTATIN CALCIUM 40 MG PO TABS: 40.0000 mg | ORAL_TABLET | Freq: Every day | ORAL | 3 refills | Status: DC

## 2022-01-15 NOTE — Addendum Note (Signed)
Addended by: Denita Lung on: 01/15/2022 01:04 PM   Modules accepted: Orders

## 2022-01-16 ENCOUNTER — Encounter: Payer: Self-pay | Admitting: Family Medicine

## 2022-01-31 ENCOUNTER — Other Ambulatory Visit: Payer: Self-pay | Admitting: Family Medicine

## 2022-02-01 MED ORDER — AMPHETAMINE-DEXTROAMPHETAMINE 30 MG PO TABS: 30.0000 mg | ORAL_TABLET | Freq: Two times a day (BID) | ORAL | 0 refills | Status: DC

## 2022-02-01 NOTE — Telephone Encounter (Signed)
[facility] is requesting to fill pt adderall. Please advise [facility]

## 2022-02-06 ENCOUNTER — Encounter: Payer: Self-pay | Admitting: Family Medicine

## 2022-04-03 DIAGNOSIS — M25512 Pain in left shoulder: Secondary | ICD-10-CM | POA: Diagnosis not present

## 2022-04-05 ENCOUNTER — Ambulatory Visit (HOSPITAL_BASED_OUTPATIENT_CLINIC_OR_DEPARTMENT_OTHER)
Admission: RE | Admit: 2022-04-05 | Discharge: 2022-04-05 | Disposition: A | Discharge status: Home / Self Care | Payer: BC Managed Care – PPO | Source: Ambulatory Visit | Attending: Orthopaedic Surgery | Admitting: Orthopaedic Surgery

## 2022-04-05 ENCOUNTER — Ambulatory Visit (HOSPITAL_BASED_OUTPATIENT_CLINIC_OR_DEPARTMENT_OTHER): Payer: BC Managed Care – PPO | Admitting: Orthopaedic Surgery

## 2022-04-05 ENCOUNTER — Other Ambulatory Visit (HOSPITAL_BASED_OUTPATIENT_CLINIC_OR_DEPARTMENT_OTHER): Payer: Self-pay | Admitting: Orthopaedic Surgery

## 2022-04-05 DIAGNOSIS — G8929 Other chronic pain: Secondary | ICD-10-CM | POA: Diagnosis not present

## 2022-04-05 DIAGNOSIS — M25512 Pain in left shoulder: Secondary | ICD-10-CM | POA: Insufficient documentation

## 2022-04-05 DIAGNOSIS — M19012 Primary osteoarthritis, left shoulder: Secondary | ICD-10-CM | POA: Diagnosis not present

## 2022-04-05 DIAGNOSIS — M24812 Other specific joint derangements of left shoulder, not elsewhere classified: Secondary | ICD-10-CM

## 2022-04-05 MED ORDER — TRIAMCINOLONE ACETONIDE 40 MG/ML IJ SUSP
80.0000 mg | INTRAMUSCULAR | Status: AC | PRN
  Administered 2022-04-05: 80 mg via INTRA_ARTICULAR

## 2022-04-05 MED ORDER — LIDOCAINE HCL 1 % IJ SOLN
4.0000 mL | INTRAMUSCULAR | Status: AC | PRN
  Administered 2022-04-05: 4 mL

## 2022-04-05 NOTE — Progress Notes (Signed)
? ?                            ? ? ?Chief Complaint: Left [place] joint symptoms ?  ? ? ?History of Present Illness:  ? ? ?Russell Peters is a 56 y.o. male presents today with pain about his left [facility] joint which has been ongoing for approximately 18 months.  This has been worsening over time.  He endorses clicking and popping about the area.  He has also been having pain about the posterior shoulder as well.  He is seen multiple doctors including Dr. Trenton Gammon at [facility] neurosurgery.  He has had previous intra-articular glenohumeral injections with Dr. Trevor Mace office in 2020 and 2022 which only gave him a couple weeks of relief.  His stepdaughter Steffanie Dunn is under our care for her hip.  Denies any frank history of trauma or dislocation about the shoulder.  Denies any history of rib fractures.  He is here today for further assessment.  He works as a Brewing technologist ? ? ? ?Surgical History:   ?None ? ?PMH/PSH/Family History/Social History/Meds/Allergies:   ? ?Past Medical History:  ?Diagnosis Date  ?? ADD (attention deficit disorder)   ?? Allergy   ?? Anxiety   ?? Chronic kidney disease   ? kidney stone years ago  ?? Depression   ?? GERD (gastroesophageal reflux disease)   ?? Hyperlipidemia   ?? Hypertension   ?? Sleep apnea   ? wears Cpap  ?? Thyroid disease   ? ?Past Surgical History:  ?Procedure Laterality Date  ?? NECK SURGERY    ?? NO PAST SURGERIES    ?? PUNCH BIOPSY OF SKIN N/A 01/03/2022  ? picker's nodule  ?? SKIN BIOPSY Bilateral 11/20/2021  ? prurio nodularis and scar of the scalp  ?? SKIN BIOPSY Left 01/03/2022  ? neurofibroma  ? ?Social History  ? ?Socioeconomic History  ?? Marital status: Divorced  ?  Spouse name: Not on file  ?? Number of children: Not on file  ?? Years of education: Not on file  ?? Highest education level: Not on file  ?Occupational History  ?? Not on file  ?Tobacco Use  ?? Smoking status: Former  ?  Years: 5.00  ?  Types: Cigarettes  ?  Quit date: 12/23/1998  ?  Years since quitting:  23.2  ?? Smokeless tobacco: Never  ?Vaping Use  ?? Vaping Use: Never used  ?Substance and Sexual Activity  ?? Alcohol use: Yes  ?  Comment: 3 beers weekly in past 8 months; 1 pt weekly liquor  ?? Drug use: No  ?? Sexual activity: Not Currently  ?Other Topics Concern  ?? Not on file  ?Social History Narrative  ?? Not on file  ? ?Social Determinants of Health  ? ?Financial Resource Strain: Not on file  ?Food Insecurity: Not on file  ?Transportation Needs: Not on file  ?Physical Activity: Not on file  ?Stress: Not on file  ?Social Connections: Not on file  ? ?Family History  ?Problem Relation Age of Onset  ?? Rheum arthritis Mother   ?? Esophageal cancer Father   ?? Colon cancer Maternal Grandfather   ?? Rectal cancer Neg Hx   ?? Stomach cancer Neg Hx   ? ?Allergies  ?Allergen Reactions  ?? Penicillins   ?  Unknown reaction.   ? ?Current Outpatient Medications  ?Medication Sig Dispense Refill  ?? Accu-Chek Softclix Lancets lancets 1 each by Other route  daily. Use as instructed to check blood sugar 100 each 3  ?? amphetamine-dextroamphetamine (ADDERALL) 30 MG tablet TAKE 1 TABLET BY MOUTH 2 TIMES DAILY. EFFECTIVE 12/19/2021 60 tablet 0  ?? amphetamine-dextroamphetamine (ADDERALL) 30 MG tablet Take 1 tablet by mouth 2 (two) times daily. 60 tablet 0  ?? amphetamine-dextroamphetamine (ADDERALL) 30 MG tablet Take 1 tablet by mouth 2 (two) times daily. 60 tablet 0  ?? fluocinolone (SYNALAR) 0.01 % external solution SMARTSIG:Drop(s) Topical 1-2 Times Daily    ?? glucose blood test strip 1 each by Other route as needed for other. Use as instructed to check blood sugar 100 each 3  ?? levothyroxine (SYNTHROID) 88 MCG tablet Take 1 tablet (88 mcg total) by mouth daily. 90 tablet 1  ?? lisinopril-hydrochlorothiazide (ZESTORETIC) 20-12.5 MG tablet Take 1 tablet by mouth daily. 90 tablet 3  ?? omeprazole (PRILOSEC) 20 MG capsule Take 1 capsule (20 mg total) by mouth daily.    ?? rosuvastatin (CRESTOR) 40 MG tablet Take 1 tablet (40  mg total) by mouth daily. 90 tablet 3  ? ?No current facility-administered medications for this visit.  ? ?No results found. ? ?Review of Systems:   ?A ROS was performed including pertinent positives and negatives as documented in the HPI. ? ?Physical Exam :   ?Constitutional: NAD and appears stated age ?Neurological: Alert and oriented ?Psych: Appropriate affect and cooperative ?There were no vitals taken for this visit.  ? ?Comprehensive Musculoskeletal Exam:   ? ?Musculoskeletal Exam    ?Inspection Right Left  ?Skin No atrophy or winging No atrophy or winging  ?Palpation    ?Tenderness None I see  ?Range of Motion    ?Flexion (passive) 170 170  ?Flexion (active) 170 170  ?Abduction 170 170  ?ER at the side 70 70  ?Can reach behind back to T12 T12  ?Strength    ? Full Full  ?Special Tests    ?Pseudoparalytic No No  ?Neurologic    ?Fires PIN, radial, median, ulnar, musculocutaneous, axillary, suprascapular, long thoracic, and spinal accessory innervated muscles. No abnormal sensibility  ?Vascular/Lymphatic    ?Radial Pulse 2+ 2+  ?Cervical Exam    ?Patient has symmetric cervical range of motion with negative Spurling's test.  ?Special Test: Terminous joint pain or crepitus with range of motion  ? ? ? ?Imaging:   ?Xray (3 views left shoulder): ?Very mild narrowing of the glenohumeral joint with inferior osteophyte as well as narrowing of the AC joint ? ?I personally reviewed and interpreted the radiographs. ? ? ?Assessment:   ?56 y.o. male with left [place] joint pain.  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  He does have very mechanical symptoms.  I have described that is not uncommon with SI joint pathology to have pain emanate to the posterior trapezius and into the shoulder.  I have stated that this generally improves with physical therapy and an [place] joint injection.  I would like to perform him for this today.  He will plan to have physical therapy for scapular  strengthening as well in order to help optimize his shoulder girdle as a whole. ? ?Plan :   ? ?-Left ultrasound-guided AC joint injection performed today after verbal consent obtained ? ? ? ?Procedure Note ? ?Patient: RIVEN MABILE             ?Date of Birth: Mar 14, 1966           ?MRN: [id number]             ?  Visit Date: 04/05/2022 ? ?Procedures: ?Visit Diagnoses:  ?1. Chronic left shoulder pain   ? ? ?Large Joint Inj on 04/05/2022 9:49 AM ?Indications: pain ?Details: 22 G 1.5 in needle, ultrasound-guided anterior approach ? ?Arthrogram: No ? ?Medications: 4 mL lidocaine 1 %; 80 mg triamcinolone acetonide 40 MG/ML ?Outcome: tolerated well, no immediate complications ?Procedure, treatment alternatives, risks and benefits explained, specific risks discussed. Consent was given by the patient. Immediately prior to procedure a time out was called to verify the correct patient, procedure, equipment, support staff and site/side marked as required. Patient was prepped and draped in the usual sterile fashion.  ? ? ? ? ? ? ?I personally saw and evaluated the patient, and participated in the management and treatment plan. ? ?Vanetta Mulders, MD ?Attending Physician, Orthopedic Surgery ? ?This document was dictated using Systems analyst. A reasonable attempt at proof reading has been made to minimize errors. ?

## 2022-04-30 ENCOUNTER — Ambulatory Visit (INDEPENDENT_AMBULATORY_CARE_PROVIDER_SITE_OTHER): Payer: BC Managed Care – PPO

## 2022-04-30 ENCOUNTER — Ambulatory Visit: Payer: BC Managed Care – PPO | Admitting: Podiatry

## 2022-04-30 DIAGNOSIS — M7751 Other enthesopathy of right foot: Secondary | ICD-10-CM | POA: Diagnosis not present

## 2022-04-30 DIAGNOSIS — M21621 Bunionette of right foot: Secondary | ICD-10-CM

## 2022-05-01 ENCOUNTER — Encounter: Payer: Self-pay | Admitting: Podiatry

## 2022-05-01 NOTE — Progress Notes (Signed)
?  Subjective:  ?Patient ID: Russell Peters, male    DOB: 07/24/1966,  MRN: [id number] ? ?Chief Complaint  ?Patient presents with  ? Callouses  ?  Callus to right foot near 5th toe. Tender when walking.   ? ? ?56 y.o. male presents with the above complaint. History confirmed with patient.  Continues to have a painful area on the outside of the right foot.  This is same area that we have treated before.  The skin continues to get thickened and hard and causes pain and issues. ? ?Objective:  ?Physical Exam: ?warm, good capillary refill, no trophic changes or ulcerative lesions, normal DP and PT pulses, normal sensory exam, and right foot he has a palpable tailor's bunion on the distal lateral fifth metatarsal with hyperkeratosis plantar and lateral to this. ? ? ?Radiographs: ?Multiple views x-ray of the right foot: There is a tailor's bunions with prominent bone and soft tissue on the lateral fifth metatarsal ?Assessment:  ? ?1. Tailor's bunion of right foot   ? ? ? ?Plan:  ?Patient was evaluated and treated and all questions answered. ? ?We reviewed today's x-rays and discussed the biomechanics that contribute to pressure on the lateral fifth metatarsal.  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  I think an orthotic or significant sugar changes may be difficult for him.  We discussed surgical correction as well.  He is interested in pursuing this.  I think this would greatly benefit him by offloading the area and realignment of the prominent bone.  I recommended a tailor's bunionectomy with fifth metatarsal osteotomy.  We discussed the risk benefits and potential complications of the procedure including but not limited to pain, swelling, infection, scar, numbness which may be temporary or permanent, chronic pain, stiffness, nerve pain or damage, wound healing problems, bone healing problems including delayed or non-union.  He  understands and wishes to proceed.  All questions were addressed.  Informed consent was signed and reviewed. ? ? ?Surgical plan: ? ?Procedure: ?-Right foot tailor's bunionectomy with fifth metatarsal osteotomy ? ?Location: ?-[facility] ? ?Anesthesia plan: ?-IV sedation with local ? ?Postoperative pain plan: ?- Tylenol 1000 mg every 6 hours, ibuprofen 600 mg every 6 hours, gabapentin 300 mg every 8 hours x5 days, oxycodone 5 mg 1-2 tabs every 6 hours only as needed ? ?DVT prophylaxis: ?-None required ? ?WB Restrictions / DME needs: ?-WBAT in Short cam boot which was dispensed today ? ? ? ?No follow-ups on file.  ? ?

## 2022-05-13 ENCOUNTER — Ambulatory Visit: Payer: 59 | Admitting: Podiatry

## 2022-06-01 ENCOUNTER — Encounter: Payer: Self-pay | Admitting: Family Medicine

## 2022-06-02 DIAGNOSIS — N390 Urinary tract infection, site not specified: Secondary | ICD-10-CM | POA: Diagnosis not present

## 2022-06-02 DIAGNOSIS — E118 Type 2 diabetes mellitus with unspecified complications: Secondary | ICD-10-CM | POA: Diagnosis not present

## 2022-06-02 DIAGNOSIS — I10 Essential (primary) hypertension: Secondary | ICD-10-CM | POA: Diagnosis not present

## 2022-06-07 ENCOUNTER — Ambulatory Visit (HOSPITAL_BASED_OUTPATIENT_CLINIC_OR_DEPARTMENT_OTHER): Payer: BC Managed Care – PPO | Admitting: Orthopaedic Surgery

## 2022-06-07 DIAGNOSIS — G8929 Other chronic pain: Secondary | ICD-10-CM | POA: Diagnosis not present

## 2022-06-07 DIAGNOSIS — M25512 Pain in left shoulder: Secondary | ICD-10-CM

## 2022-06-07 NOTE — Progress Notes (Signed)
Chief Complaint: Left [place] joint symptoms     History of Present Illness:   06/07/2022: Presents today for left shoulder [place] joint issues overall his pain is completely resolved since last injection denies any pain about the [place] joint.  He has been able to work without difficulty.  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 male presents today with pain about his left [place] joint which has been ongoing for approximately 18 months.  This has been worsening over time.  He endorses clicking and popping about the area.  He has also been having pain about the posterior shoulder as well.  He is seen multiple doctors including Dr. Trenton Gammon at [facility] neurosurgery.  He has had previous intra-articular glenohumeral injections with Dr. Trevor Mace office in 2020 and 2022 which only gave him a couple weeks of relief.  His stepdaughter Steffanie Dunn is under our care for her hip.  Denies any frank history of trauma or dislocation about the shoulder.  Denies any history of rib fractures.  He is here today for further assessment.  He works as a Teacher, early years/pre History:   None  PMH/PSH/Family History/Social History/Meds/Allergies:    Past Medical History:  Diagnosis Date   ADD (attention deficit disorder)    Allergy    Anxiety    Chronic kidney disease    kidney stone years ago   Depression    GERD (gastroesophageal reflux disease)    Hyperlipidemia    Hypertension    Sleep apnea    wears Cpap   Thyroid disease    Past Surgical History:  Procedure Laterality Date   NECK SURGERY     NO PAST SURGERIES     PUNCH BIOPSY OF SKIN N/A 01/03/2022   picker's nodule   SKIN BIOPSY Bilateral 11/20/2021   prurio nodularis and scar of the scalp   SKIN BIOPSY Left 01/03/2022   neurofibroma   Social History   Socioeconomic History   Marital status: Divorced    Spouse name: Not on file   Number of children: Not on file   Years of education: Not on file    Highest education level: Not on file  Occupational History   Not on file  Tobacco Use   Smoking status: Former    Years: 5.00    Types: Cigarettes    Quit date: 12/23/1998    Years since quitting: 23.4   Smokeless tobacco: Never  Vaping Use   Vaping Use: Never used  Substance and Sexual Activity   Alcohol use: Yes    Comment: 3 beers weekly in past 8 months; 1 pt weekly liquor   Drug use: No   Sexual activity: Not Currently  Other Topics Concern   Not on file  Social History Narrative   Not on file   Social Determinants of Health   Financial Resource Strain: Not on file  Food Insecurity: Not on file  Transportation Needs: Not on file  Physical Activity: Not on file  Stress: Not on file  Social Connections: Not on file   Family History  Problem Relation Age of Onset   Rheum arthritis Mother    Esophageal cancer Father    Colon cancer Maternal Grandfather    Rectal cancer Neg Hx    Stomach cancer  Neg Hx    Allergies  Allergen Reactions   Penicillins     Unknown reaction.    Current Outpatient Medications  Medication Sig Dispense Refill   Accu-Chek Softclix Lancets lancets 1 each by Other route daily. Use as instructed to check blood sugar 100 each 3   amphetamine-dextroamphetamine (ADDERALL) 30 MG tablet TAKE 1 TABLET BY MOUTH 2 TIMES DAILY. EFFECTIVE 12/19/2021 60 tablet 0   amphetamine-dextroamphetamine (ADDERALL) 30 MG tablet Take 1 tablet by mouth 2 (two) times daily. 60 tablet 0   amphetamine-dextroamphetamine (ADDERALL) 30 MG tablet Take 1 tablet by mouth 2 (two) times daily. 60 tablet 0   atorvastatin (LIPITOR) 20 MG tablet Take 20 mg by mouth daily.     fluocinolone (SYNALAR) 0.01 % external solution SMARTSIG:Drop(s) Topical 1-2 Times Daily     glucose blood test strip 1 each by Other route as needed for other. Use as instructed to check blood sugar 100 each 3   levothyroxine (SYNTHROID) 88 MCG tablet Take 1 tablet (88 mcg total) by mouth daily. 90 tablet 1    lisinopril-hydrochlorothiazide (ZESTORETIC) 20-12.5 MG tablet Take 1 tablet by mouth daily. 90 tablet 3   omeprazole (PRILOSEC) 20 MG capsule Take 1 capsule (20 mg total) by mouth daily.     rosuvastatin (CRESTOR) 40 MG tablet Take 1 tablet (40 mg total) by mouth daily. 90 tablet 3   No current facility-administered medications for this visit.   No results found.  Review of Systems:   A ROS was performed including pertinent positives and negatives as documented in the HPI.  Physical Exam :   Constitutional: NAD and appears stated age Neurological: Alert and oriented Psych: Appropriate affect and cooperative There were no vitals taken for this visit.   Comprehensive Musculoskeletal Exam:    Musculoskeletal Exam    Inspection Right Left  Skin No atrophy or winging No atrophy or winging  Palpation    Tenderness None No tenderness  Range of Motion    Flexion (passive) 170 170  Flexion (active) 170 170  Abduction 170 170  ER at the side 70 70  Can reach behind back to T12 T12  Strength     Full Full  Special Tests    Pseudoparalytic No No  Neurologic    Fires PIN, radial, median, ulnar, musculocutaneous, axillary, suprascapular, long thoracic, and spinal accessory innervated muscles. No abnormal sensibility  Vascular/Lymphatic    Radial Pulse 2+ 2+  Cervical Exam    Patient has symmetric cervical range of motion with negative Spurling's test.  Special Test:      Imaging:   Xray (3 views left shoulder): Very mild narrowing of the glenohumeral joint with inferior osteophyte as well as narrowing of the [facility] joint  I personally reviewed and interpreted the radiographs.   Assessment:   56 y.o. male with left [place] joint pain.  At today's visit he has much improved pain.  Really he is asymptomatic at this point.  I will plan to see him on an as-needed basis should he need an additional [place] joint injection  Plan :    -Return to clinic as needed     I personally saw and  evaluated the patient, and participated in the management and treatment plan.  Vanetta Mulders, MD Attending Physician, Orthopedic Surgery  This document was dictated using Dragon voice recognition software. A reasonable attempt at proof reading has been made to minimize errors.

## 2022-06-19 ENCOUNTER — Ambulatory Visit: Payer: BC Managed Care – PPO | Admitting: Family Medicine

## 2022-06-19 VITALS — BP 144/90 | HR 92 | Temp 98.1°F | Wt 237.8 lb

## 2022-06-19 DIAGNOSIS — Z860101 Personal history of adenomatous and serrated colon polyps: Secondary | ICD-10-CM

## 2022-06-19 DIAGNOSIS — Z86010 Personal history of colonic polyps: Secondary | ICD-10-CM | POA: Diagnosis not present

## 2022-06-19 DIAGNOSIS — N3000 Acute cystitis without hematuria: Secondary | ICD-10-CM | POA: Diagnosis not present

## 2022-06-19 DIAGNOSIS — K6289 Other specified diseases of anus and rectum: Secondary | ICD-10-CM | POA: Diagnosis not present

## 2022-06-19 DIAGNOSIS — R399 Unspecified symptoms and signs involving the genitourinary system: Secondary | ICD-10-CM

## 2022-06-19 LAB — POCT URINALYSIS DIP (PROADVANTAGE DEVICE)
Bilirubin, UA: NEGATIVE
Blood, UA: NEGATIVE
Glucose, UA: NEGATIVE mg/dL
Ketones, POC UA: NEGATIVE mg/dL
Leukocytes, UA: NEGATIVE
Nitrite, UA: NEGATIVE
Protein Ur, POC: NEGATIVE mg/dL
Specific Gravity, Urine: 1.02
Urobilinogen, Ur: 0.2
pH, UA: 7.5 (ref 5.0–8.0)

## 2022-06-19 NOTE — Progress Notes (Signed)
   Subjective:    Patient ID: Russell Peters, male    DOB: 1966/11/08, 56 y.o.   MRN: [id number]  HPI He is here for evaluation of a previous history of urinary tract infection.  He did have dysuria and frequency several weeks ago and was treated with an antibiotic for 5 days.  Apparently they did a culture which did show E. coli.  He still complains of some slight urinary pressure.  He also states that he has a 53-monthhistory of difficulty with slight anal discharge.  No rectal pain, bowel habit change, constipation or diarrhea.  He states that it is sometimes clear and sometimes has a slight color to it.   Review of Systems     Objective:   Physical Exam Urine dipstick is negative.  Anal exam shows some very tiny hemorrhoidal type tissue.  Digital rectal exam showed no particular lesions.       Assessment & Plan:  UTI symptoms - Plan: POCT Urinalysis DIP (Proadvantage Device)  Acute cystitis without hematuria  Hx of adenomatous colonic polyps  Rectal discomfort - Plan: Ambulatory referral to Gastroenterology Discussed the fact that if he does get urinary tract symptoms again, further evaluation including urologic referral will be made.  He does have a history of colonic polyps but I do not think that is causing the present trouble and will therefore refer to GI to see if there is an issue causing the slight rectal drainage.

## 2022-06-28 ENCOUNTER — Encounter: Payer: BC Managed Care – PPO | Admitting: Podiatry

## 2022-06-29 ENCOUNTER — Other Ambulatory Visit: Payer: Self-pay | Admitting: Family Medicine

## 2022-06-29 DIAGNOSIS — E039 Hypothyroidism, unspecified: Secondary | ICD-10-CM

## 2022-07-01 NOTE — Telephone Encounter (Signed)
Lvm for pt to call back to see if he has enough to last until his appt. [place]

## 2022-07-11 ENCOUNTER — Encounter: Payer: BC Managed Care – PPO | Admitting: Podiatry

## 2022-07-16 ENCOUNTER — Ambulatory Visit: Payer: 59 | Admitting: Family Medicine

## 2022-08-01 ENCOUNTER — Encounter: Payer: BC Managed Care – PPO | Admitting: Podiatry

## 2022-08-06 ENCOUNTER — Other Ambulatory Visit: Payer: Self-pay | Admitting: Family Medicine

## 2022-08-06 MED ORDER — AMPHETAMINE-DEXTROAMPHETAMINE 30 MG PO TABS: 30.0000 mg | ORAL_TABLET | Freq: Two times a day (BID) | ORAL | 0 refills | Status: DC

## 2022-08-06 NOTE — Telephone Encounter (Signed)
Refill for Adderall last apt 06/19/22 next apt 01/17/23.

## 2022-08-28 ENCOUNTER — Encounter: Payer: Self-pay | Admitting: Internal Medicine

## 2022-09-02 ENCOUNTER — Ambulatory Visit: Payer: BC Managed Care – PPO | Admitting: Family Medicine

## 2022-09-05 ENCOUNTER — Ambulatory Visit: Payer: BC Managed Care – PPO | Admitting: Family Medicine

## 2022-09-05 ENCOUNTER — Encounter: Payer: Self-pay | Admitting: Family Medicine

## 2022-09-05 VITALS — BP 132/88 | HR 86 | Temp 97.5°F | Wt 236.8 lb

## 2022-09-05 DIAGNOSIS — R151 Fecal smearing: Secondary | ICD-10-CM

## 2022-09-05 DIAGNOSIS — E785 Hyperlipidemia, unspecified: Secondary | ICD-10-CM | POA: Diagnosis not present

## 2022-09-05 DIAGNOSIS — Z23 Encounter for immunization: Secondary | ICD-10-CM | POA: Diagnosis not present

## 2022-09-05 DIAGNOSIS — E1169 Type 2 diabetes mellitus with other specified complication: Secondary | ICD-10-CM

## 2022-09-05 DIAGNOSIS — K219 Gastro-esophageal reflux disease without esophagitis: Secondary | ICD-10-CM

## 2022-09-05 DIAGNOSIS — E039 Hypothyroidism, unspecified: Secondary | ICD-10-CM | POA: Diagnosis not present

## 2022-09-05 DIAGNOSIS — I10 Essential (primary) hypertension: Secondary | ICD-10-CM | POA: Diagnosis not present

## 2022-09-05 LAB — POCT UA - MICROALBUMIN
Albumin/Creatinine Ratio, Urine, POC: 72.5
Creatinine, POC: 84 mg/dL
Microalbumin Ur, POC: 60.9 mg/L

## 2022-09-05 LAB — POCT GLYCOSYLATED HEMOGLOBIN (HGB A1C): Hemoglobin A1C: 6.1 % — AB (ref 4.0–5.6)

## 2022-09-05 MED ORDER — AMLODIPINE BESYLATE 5 MG PO TABS: 5.0000 mg | ORAL_TABLET | Freq: Every day | ORAL | 1 refills | Status: DC

## 2022-09-05 NOTE — Progress Notes (Signed)
Subjective:    Patient ID: Russell Peters, male    DOB: March 15, 1966, 56 y.o.   MRN: [id number]  Russell Peters is a 56 y.o. male who presents for follow-up of Type 2 diabetes mellitus.  Patient is checking home blood sugars.   Home blood sugar records: BGs have been labile ranging between 126 and 167 How often is blood sugars being checked: QD Current symptoms/problems include none and have been stable. Daily foot checks: yes   Any foot concerns: none Last eye exam: six months ago Dr. Katy Fitch  Exercise:  staying active at least 20 minutes  a day Presently he is on no antidiabetes medications.  He continues on his Adderall and states that 1 pill lasts roughly 5 to 6 hours and he occasionally does not take the second 1.  He is taking Crestor but complains of some morning aches and pains but this tends to go away as he proceeds through the day.  Does have underlying ED and does occasionally need medication.  Continues on Synthroid and having no difficulty with that.  Continues on lisinopril/HCTZ.  He has lost weight over the last couple of years and is happy with this. The following portions of the patient's history were reviewed and updated as appropriate: allergies, current medications, past medical history, past social history and problem list.  ROS as in subjective above.     Objective:    Physical Exam Alert and in no distress otherwise not examined. Hemoglobin A1c is 6.1 Blood pressure 132/88, pulse 86, temperature (!) 97.5 F (36.4 C), weight 236 lb 12.8 oz (107.4 kg), SpO2 98 %.  Lab Review    Latest Ref Rng & Units 09/05/2022    4:38 PM 09/05/2022    4:37 PM 01/14/2022   10:22 AM 01/14/2022    9:22 AM 06/26/2021    9:19 AM  Diabetic Labs  HbA1c 4.0 - 5.6 %  6.1  6.0     Microalbumin mg/L 60.9       Micro/Creat Ratio  72.5       Chol 100 - 199 mg/dL    160  214   HDL >39 mg/dL    51  51   Calc LDL 0 - 99 mg/dL    99  139   Triglycerides 0 - 149 mg/dL    46  134   Creatinine  0.76 - 1.27 mg/dL     0.98       09/05/2022    4:26 PM 09/05/2022    3:41 PM 06/19/2022    3:30 PM 01/14/2022    8:39 AM 10/16/2021    8:12 AM  BP/Weight  Systolic BP 176 160 737 106 269  Diastolic BP 88 98 90 86 96  Wt. (Lbs)  236.8 237.8 240.6 241.2  BMI  34.97 kg/m2 35.12 kg/m2 35.53 kg/m2 33.64 kg/m2      06/26/2021    8:15 AM  Foot/eye exam completion dates  Foot Form Completion Done    Wendell  reports that he quit smoking about 23 years ago. His smoking use included cigarettes. He has never used smokeless tobacco. He reports current alcohol use. He reports that he does not use drugs.     Assessment & Plan:    Type 2 diabetes mellitus with other specified complication, without long-term current use of insulin (HCC) - Plan: POCT glycosylated hemoglobin (Hb A1C), POCT UA - Microalbumin, Comprehensive metabolic panel, CBC with Differential/Platelet, Lipid panel  Hyperlipidemia associated with type 2 diabetes  mellitus ([place]) - Plan: Lipid panel  Hypothyroidism, unspecified type - Plan: TSH  Hypertension, unspecified type - Plan: amLODipine (NORVASC) 5 MG tablet  Gastroesophageal reflux disease, unspecified whether esophagitis present  Soiling - Plan: Ambulatory referral to Gastroenterology  Need for shingles vaccine - Plan: Varicella-zoster vaccine IM  Need for influenza vaccination - Plan: Flu Vaccine QUAD 4moIM (Fluarix, Fluzone & Alfiuria Quad PF)  Rx changes:  Norvasc added to his regimen.  He is also to bring his blood pressure cuff in with him in 1 month.  I complemented him on his weight loss. Education: Reviewed 'ABCs' of diabetes management (respective goals in parentheses):  A1C (<7), blood pressure (<130/80), and cholesterol (LDL <100). Compliance at present is estimated to be good. Efforts to improve compliance (if necessary) will be directed at  continue on present medication regimen. . Follow up: 1 month

## 2022-09-06 LAB — COMPREHENSIVE METABOLIC PANEL
ALT: 29 IU/L (ref 0–44)
AST: 23 IU/L (ref 0–40)
Albumin/Globulin Ratio: 2.3 — ABNORMAL HIGH (ref 1.2–2.2)
Albumin: 4.9 g/dL (ref 3.8–4.9)
Alkaline Phosphatase: 67 IU/L (ref 44–121)
BUN/Creatinine Ratio: 11 (ref 9–20)
BUN: 12 mg/dL (ref 6–24)
Bilirubin Total: 1.3 mg/dL — ABNORMAL HIGH (ref 0.0–1.2)
CO2: 23 mmol/L (ref 20–29)
Calcium: 9.7 mg/dL (ref 8.7–10.2)
Chloride: 98 mmol/L (ref 96–106)
Creatinine, Ser: 1.08 mg/dL (ref 0.76–1.27)
Globulin, Total: 2.1 g/dL (ref 1.5–4.5)
Glucose: 116 mg/dL — ABNORMAL HIGH (ref 70–99)
Potassium: 4 mmol/L (ref 3.5–5.2)
Sodium: 137 mmol/L (ref 134–144)
Total Protein: 7 g/dL (ref 6.0–8.5)
eGFR: 82 mL/min/{1.73_m2} (ref >59)

## 2022-09-06 LAB — CBC WITH DIFFERENTIAL/PLATELET
Basophils Absolute: 0 10*3/uL (ref 0.0–0.2)
Basos: 1 %
EOS (ABSOLUTE): 0.2 10*3/uL (ref 0.0–0.4)
Eos: 2 %
Hematocrit: 46.7 % (ref 37.5–51.0)
Hemoglobin: 16 g/dL (ref 13.0–17.7)
Immature Grans (Abs): 0 10*3/uL (ref 0.0–0.1)
Immature Granulocytes: 0 %
Lymphocytes Absolute: 2.6 10*3/uL (ref 0.7–3.1)
Lymphs: 30 %
MCH: 31.4 pg (ref 26.6–33.0)
MCHC: 34.3 g/dL (ref 31.5–35.7)
MCV: 92 fL (ref 79–97)
Monocytes Absolute: 0.7 10*3/uL (ref 0.1–0.9)
Monocytes: 8 %
Neutrophils Absolute: 5.1 10*3/uL (ref 1.4–7.0)
Neutrophils: 59 %
Platelets: 289 10*3/uL (ref 150–450)
RBC: 5.1 x10E6/uL (ref 4.14–5.80)
RDW: 11.8 % (ref 11.6–15.4)
WBC: 8.6 10*3/uL (ref 3.4–10.8)

## 2022-09-06 LAB — LIPID PANEL
Chol/HDL Ratio: 2.2 ratio (ref 0.0–5.0)
Cholesterol, Total: 143 mg/dL (ref 100–199)
HDL: 65 mg/dL (ref >39)
LDL Chol Calc (NIH): 65 mg/dL (ref 0–99)
Triglycerides: 61 mg/dL (ref 0–149)
VLDL Cholesterol Cal: 13 mg/dL (ref 5–40)

## 2022-09-06 LAB — TSH: TSH: 1.91 u[IU]/mL (ref 0.450–4.500)

## 2022-10-03 ENCOUNTER — Ambulatory Visit: Payer: BC Managed Care – PPO | Admitting: Family Medicine

## 2022-10-07 ENCOUNTER — Other Ambulatory Visit: Payer: Self-pay | Admitting: Family Medicine

## 2022-10-07 DIAGNOSIS — I152 Hypertension secondary to endocrine disorders: Secondary | ICD-10-CM

## 2022-10-14 ENCOUNTER — Encounter: Payer: Self-pay | Admitting: Internal Medicine

## 2022-11-06 DIAGNOSIS — H40013 Open angle with borderline findings, low risk, bilateral: Secondary | ICD-10-CM | POA: Diagnosis not present

## 2022-11-06 DIAGNOSIS — R7309 Other abnormal glucose: Secondary | ICD-10-CM | POA: Diagnosis not present

## 2022-11-06 LAB — HM DIABETES EYE EXAM

## 2022-11-12 ENCOUNTER — Other Ambulatory Visit: Payer: Self-pay | Admitting: Family Medicine

## 2022-11-12 DIAGNOSIS — E039 Hypothyroidism, unspecified: Secondary | ICD-10-CM

## 2022-11-13 ENCOUNTER — Encounter: Payer: Self-pay | Admitting: *Deleted

## 2022-12-19 ENCOUNTER — Other Ambulatory Visit: Payer: Self-pay | Admitting: Family Medicine

## 2022-12-19 DIAGNOSIS — E039 Hypothyroidism, unspecified: Secondary | ICD-10-CM

## 2023-01-06 ENCOUNTER — Encounter: Payer: Self-pay | Admitting: Gastroenterology

## 2023-01-13 DIAGNOSIS — L814 Other melanin hyperpigmentation: Secondary | ICD-10-CM | POA: Diagnosis not present

## 2023-01-13 DIAGNOSIS — L02821 Furuncle of head [any part, except face]: Secondary | ICD-10-CM | POA: Diagnosis not present

## 2023-01-13 DIAGNOSIS — D225 Melanocytic nevi of trunk: Secondary | ICD-10-CM | POA: Diagnosis not present

## 2023-01-13 DIAGNOSIS — L821 Other seborrheic keratosis: Secondary | ICD-10-CM | POA: Diagnosis not present

## 2023-01-17 ENCOUNTER — Encounter: Payer: 59 | Admitting: Family Medicine

## 2023-01-24 ENCOUNTER — Other Ambulatory Visit: Payer: Self-pay | Admitting: Family Medicine

## 2023-02-05 ENCOUNTER — Ambulatory Visit: Payer: BC Managed Care – PPO | Admitting: Family Medicine

## 2023-02-05 ENCOUNTER — Encounter: Payer: Self-pay | Admitting: Family Medicine

## 2023-02-05 VITALS — BP 130/88 | HR 83 | Temp 97.4°F | Resp 16 | Ht 69.5 in | Wt 237.0 lb

## 2023-02-05 DIAGNOSIS — E1169 Type 2 diabetes mellitus with other specified complication: Secondary | ICD-10-CM

## 2023-02-05 DIAGNOSIS — E66812 Obesity, class 2: Secondary | ICD-10-CM

## 2023-02-05 DIAGNOSIS — Z23 Encounter for immunization: Secondary | ICD-10-CM

## 2023-02-05 DIAGNOSIS — I10 Essential (primary) hypertension: Secondary | ICD-10-CM | POA: Diagnosis not present

## 2023-02-05 DIAGNOSIS — Z0000 Encounter for general adult medical examination without abnormal findings: Secondary | ICD-10-CM

## 2023-02-05 DIAGNOSIS — F988 Other specified behavioral and emotional disorders with onset usually occurring in childhood and adolescence: Secondary | ICD-10-CM

## 2023-02-05 DIAGNOSIS — K219 Gastro-esophageal reflux disease without esophagitis: Secondary | ICD-10-CM

## 2023-02-05 DIAGNOSIS — E039 Hypothyroidism, unspecified: Secondary | ICD-10-CM | POA: Diagnosis not present

## 2023-02-05 DIAGNOSIS — Z860101 Personal history of adenomatous and serrated colon polyps: Secondary | ICD-10-CM

## 2023-02-05 DIAGNOSIS — Z86010 Personal history of colonic polyps: Secondary | ICD-10-CM

## 2023-02-05 DIAGNOSIS — E669 Obesity, unspecified: Secondary | ICD-10-CM

## 2023-02-05 DIAGNOSIS — E785 Hyperlipidemia, unspecified: Secondary | ICD-10-CM

## 2023-02-05 DIAGNOSIS — F325 Major depressive disorder, single episode, in full remission: Secondary | ICD-10-CM

## 2023-02-05 LAB — POCT URINALYSIS DIP (PROADVANTAGE DEVICE)
Bilirubin, UA: NEGATIVE
Blood, UA: NEGATIVE
Glucose, UA: NEGATIVE mg/dL
Ketones, POC UA: NEGATIVE mg/dL
Leukocytes, UA: NEGATIVE
Nitrite, UA: NEGATIVE
Specific Gravity, Urine: 1.01
Urobilinogen, Ur: 0.2
pH, UA: 7.5 (ref 5.0–8.0)

## 2023-02-05 LAB — POCT GLYCOSYLATED HEMOGLOBIN (HGB A1C): Hemoglobin A1C: 6.9 % — AB (ref 4.0–5.6)

## 2023-02-05 MED ORDER — EMPAGLIFLOZIN 10 MG PO TABS: 10.0000 mg | ORAL_TABLET | Freq: Every day | ORAL | 1 refills | Status: DC

## 2023-02-05 NOTE — Progress Notes (Signed)
Complete physical exam  Patient: Russell Peters   DOB: 1966-06-11   57 y.o. Male  MRN: [id number]  Subjective:    Chief Complaint  Patient presents with   Annual Exam    Fasting.     Russell Peters is a 57 y.o. male who presents today for a complete physical exam. He reports consuming a general diet. Home exercise routine includes walking daily. He generally feels well. He reports sleeping fairly well. He does not have additional problems to discuss today.  He continues on Crestor and is having no difficulty with that.  He does check his blood sugars and states that they usually stay below 160.  Presently he is not on any diabetes meds.  Continues on Synthroid as well as lisinopril/HCTZ.  And amlodipine.  He uses his Adderall on an as-needed basis. He does have underlying ADD and gets his medicine from an online service.  He does have a history of colonic polyps and is scheduled for follow-up colonoscopy in the near future.  He does not smoke or drink.  Psychologically he is doing quite nicely and has been married for less than a year which is going quite well.  Most recent fall risk assessment:    02/05/2023   11:36 AM  [name] in the past year? 0  Number falls in past yr: 0  Injury with Fall? 0  Risk for fall due to : No Fall Risks  Follow up Falls evaluation completed     Most recent depression screenings:    02/05/2023   11:37 AM 09/05/2022    3:36 PM  PHQ 2/9 Scores  PHQ - 2 Score 0 0  PHQ- 9 Score 0 0    Vision:Within last year and Dental: No current dental problems and Receives regular dental care    Patient Care Team: Denita Lung, MD as PCP - General (Family Medicine)   Outpatient Medications Prior to Visit  Medication Sig   amLODipine (NORVASC) 5 MG tablet Take 1 tablet (5 mg total) by mouth daily.   amphetamine-dextroamphetamine (ADDERALL) 30 MG tablet Take 1 tablet by mouth 2 (two) times daily.   doxycycline (VIBRAMYCIN) 100 MG capsule Take  100 mg by mouth 2 (two) times daily.   fluocinolone (SYNALAR) 0.01 % external solution SMARTSIG:Drop(s) Topical 1-2 Times Daily   glucose blood test strip 1 each by Other route as needed for other. Use as instructed to check blood sugar   levothyroxine (SYNTHROID) 88 MCG tablet TAKE 1 TABLET (88 MCG TOTAL) BY MOUTH DAILY.   lisinopril-hydrochlorothiazide (ZESTORETIC) 20-12.5 MG tablet TAKE 1 TABLET BY MOUTH DAILY.   omeprazole (PRILOSEC) 20 MG capsule Take 1 capsule (20 mg total) by mouth daily.   rosuvastatin (CRESTOR) 40 MG tablet TAKE 1 TABLET (40 MG TOTAL) BY MOUTH DAILY.   [DISCONTINUED] amphetamine-dextroamphetamine (ADDERALL) 30 MG tablet Take 1 tablet by mouth 2 (two) times daily.   Accu-Chek Softclix Lancets lancets 1 each by Other route daily. Use as instructed to check blood sugar   [DISCONTINUED] amphetamine-dextroamphetamine (ADDERALL) 30 MG tablet TAKE 1 TABLET BY MOUTH 2 TIMES DAILY. FILL ON/AFTER 04/01/22   No facility-administered medications prior to visit.    Review of Systems  All other systems reviewed and are negative.         Objective:     BP 130/88   Pulse 83   Temp (!) 97.4 F (36.3 C) (Oral)   Resp 16   Ht 5' 9.5" (  1.765 m)   Wt 237 lb (107.5 kg)   SpO2 96% Comment: room air  BMI 34.50 kg/m    Physical Exam   Alert and in no distress. Tympanic membranes and canals are normal. Pharyngeal area is normal. Neck is supple without adenopathy or thyromegaly. Cardiac exam shows a regular sinus rhythm without murmurs or gallops. Lungs are clear to auscultation. Hemoglobin A1c is 6.9     Assessment & Plan:    Annual physical exam - Plan: POCT Urinalysis DIP (Proadvantage Device)  Hypertension, unspecified type  Gastroesophageal reflux disease, unspecified whether esophagitis present  Hypothyroidism, unspecified type  Hyperlipidemia associated with type 2 diabetes mellitus ([name])  Type 2 diabetes mellitus with other specified complication, without  long-term current use of insulin ([name]) - Plan: POCT glycosylated hemoglobin (Hb A1C), Amb Referral to Nutrition and Diabetic Education, empagliflozin (JARDIANCE) 10 MG TABS tablet  Hx of adenomatous colonic polyps  Obesity, Class II, BMI 35-39.9  Attention deficit disorder, unspecified hyperactivity presence  Depression, major, in remission ([name])  Need for COVID-19 vaccine - Plan: Pfizer Fall 2023 Covid-19 Vaccine 62yr and older  Need for shingles vaccine - Plan: Varicella-zoster vaccine IM  Immunization History  Administered Date(s) Administered   COVID-19, mRNA, vaccine(Comirnaty)12 years and older 02/05/2023   Influenza,inj,Quad PF,6+ Mos 10/16/2021, 09/05/2022   Influenza-Unspecified 02/25/2018, 10/18/2019   Moderna Covid-19 Vaccine Bivalent Booster 175yr& up 10/16/2021   Moderna Sars-Covid-2 Vaccination 07/07/2020, 08/04/2020, 01/11/2021   PNEUMOCOCCAL CONJUGATE-20 06/26/2021   Tdap 02/04/2017   Zoster Recombinat (Shingrix) 09/05/2022, 02/05/2023    Health Maintenance  Topic Date Due   HIV Screening  Never done   FOOT EXAM  06/26/2022   COLONOSCOPY (Pts 45-491yrnsurance coverage will need to be confirmed)  01/26/2023   HEMOGLOBIN A1C  08/06/2023   Diabetic kidney evaluation - eGFR measurement  09/06/2023   Diabetic kidney evaluation - Urine ACR  09/06/2023   OPHTHALMOLOGY EXAM  11/07/2023   DTaP/Tdap/Td (2 - Td or Tdap) 02/04/2027   INFLUENZA VACCINE  Completed   COVID-19 Vaccine  Completed   Hepatitis C Screening  Completed   Zoster Vaccines- Shingrix  Completed   HPV VACCINES  Aged Out   Fecal DNA (Cologuard)  Discontinued    Discussed the hemoglobin A1c of 6.9 and options including diet, exercise, medications.  Discussed GLP versus SGLT2.  He will continue on his other medications as well as his exercise and I will refer to dietitian to help fine-tune that and start him on Jardiance.  Discussed possible side effects.  Recheck here in 4 months..  Problem List  Items Addressed This Visit     Attention deficit disorder   Depression, major, in remission ([place] Diabetes mellitus ([place] Relevant Medications   empagliflozin (JARDIANCE) 10 MG TABS tablet   Other Relevant Orders   POCT glycosylated hemoglobin (Hb A1C) (Completed)   Amb Referral to Nutrition and Diabetic Education   Gastroesophageal reflux disease   Hx of adenomatous colonic polyps   Hyperlipidemia associated with type 2 diabetes mellitus ([place] Relevant Medications   empagliflozin (JARDIANCE) 10 MG TABS tablet   Hypertension   Hypothyroid   Obesity, Class II, BMI 35-39.9   Other Visit Diagnoses     Annual physical exam    -  Primary   Relevant Orders   POCT Urinalysis DIP (Proadvantage Device) (Completed)   Need for COVID-19 vaccine       Relevant Orders   Pfizer Fall 2023 Covid-19 Vaccine 12y55yr  and older (Completed)   Need for shingles vaccine       Relevant Orders   Varicella-zoster vaccine IM (Completed)      Follow-up 4 months.    Jill Alexanders, MD

## 2023-02-19 DIAGNOSIS — L02821 Furuncle of head [any part, except face]: Secondary | ICD-10-CM | POA: Diagnosis not present

## 2023-03-05 ENCOUNTER — Ambulatory Visit: Payer: BC Managed Care – PPO | Admitting: Family Medicine

## 2023-03-13 ENCOUNTER — Other Ambulatory Visit: Payer: Self-pay | Admitting: Family Medicine

## 2023-03-13 DIAGNOSIS — I152 Hypertension secondary to endocrine disorders: Secondary | ICD-10-CM

## 2023-03-13 NOTE — Telephone Encounter (Signed)
Pt needs a refill on his lisinopril today if possible he states he will be going out of town today. Prefers  [place], [place]

## 2023-04-04 ENCOUNTER — Encounter: Payer: BC Managed Care – PPO | Attending: Family Medicine | Admitting: Dietician

## 2023-04-04 ENCOUNTER — Encounter: Payer: Self-pay | Admitting: Dietician

## 2023-04-04 ENCOUNTER — Other Ambulatory Visit: Payer: Self-pay | Admitting: Family Medicine

## 2023-04-04 VITALS — Ht 69.0 in | Wt 241.6 lb

## 2023-04-04 DIAGNOSIS — E119 Type 2 diabetes mellitus without complications: Secondary | ICD-10-CM | POA: Diagnosis not present

## 2023-04-04 NOTE — Patient Instructions (Addendum)
Goal: start eating breakfast (ex: protein shake and fruit, boiled egg on toast, apple and peanut butter)  Goal: start testing your blood sugar (fasting in morning, and 2 hours after a meal)  Ranges:  Fasting: 80-130mg /dL 2 hours post-meal: 180mg /dL  Aim to eat within 1-2 hours of waking up and every 3-5 hours following. Avoid skipping meals.   At snacks aim to include a carb and protein.   Aim to make 1/2 of your plate vegetables as often as possible.   Aim for 150 minutes of physical activity weekly. Continue your current exercise regimen!

## 2023-04-04 NOTE — Telephone Encounter (Signed)
Refill request last apt 02/05/23 next apt 07/10/23.

## 2023-04-04 NOTE — Progress Notes (Signed)
Diabetes Self-Management Education  Visit Type: First/Initial  Appt. Start Time: 0850 Appt. End Time: 0940  04/04/2023  [name], identified by name and date of birth, is a 57 y.o. male with a diagnosis of Diabetes: Type 2.   ASSESSMENT  Primary concern: Pt states he is confused because he feels that he doesn't know a lot about diabetes.   History includes: allergies, anxiety, depression, GERD, sleep apnea, HLD, HTN, type 2 diabetes. Labs noted: 02/05/23 A1c 6.9% Medications include: jardiance Supplements: not assessed  Pt states he was checking his blood glucose but didn't make changes based on result and would like to better understand the readings because this caused him to fall out of the habit of checking.   Pt states he has familyy hx of diabetes.  Pt states he works at a [employer] home and theres always chips, goldfish, and other snacks available and he feels that he snacks all day instead of having a meal.   Pt reports he and his wife eat dinner at home most often and eat a lot of plant-based meals including lots of vegetables and beans.   Pt reports he has been trying to avoid starches at dinner for a few years but at work has goldfish, chips, and sliders.   Pt states he drinks 64 oz water daily along with black coffee in the morning.   Height  (1.753 m), weight 241 lb 9.6 oz (109.6 kg). Body mass index is 35.68 kg/m.   Diabetes Self-Management Education - 04/04/23 0848       Visit Information   Visit Type First/Initial      Initial Visit   Diabetes Type Type 2    Date Diagnosed 01/2023    Are you currently following a meal plan? No    Are you taking your medications as prescribed? No      Health Coping   How would you rate your overall health? Fair      Psychosocial Assessment   Patient Belief/Attitude about Diabetes Other (comment)   confused   What is the hardest part about your diabetes right now, causing you the most concern, or is the  most worrisome to you about your diabetes?   Making healty food and beverage choices    Self-care barriers None    Self-management support Doctor's office    Other persons present Patient    Patient Concerns Nutrition/Meal planning    Special Needs None    Preferred Learning Style No preference indicated    Learning Readiness Ready    How often do you need to have someone help you when you read instructions, pamphlets, or other written materials from your doctor or pharmacy? 1 - Never    What is the last grade level you completed in school? associates      Pre-Education Assessment   Patient understands the diabetes disease and treatment process. Needs Instruction    Patient understands incorporating nutritional management into lifestyle. Needs Instruction    Patient undertands incorporating physical activity into lifestyle. Needs Instruction    Patient understands using medications safely. Needs Instruction    Patient understands monitoring blood glucose, interpreting and using results Needs Instruction    Patient understands prevention, detection, and treatment of acute complications. Needs Instruction    Patient understands prevention, detection, and treatment of chronic complications. Needs Instruction    Patient understands how to develop strategies to address psychosocial issues. Needs Instruction    Patient understands how to develop strategies to  promote health/change behavior. Needs Instruction      Complications   Last HgB A1C per patient/outside source 6.9 %    How often do you check your blood sugar? 0 times/day (not testing)    Have you had a dilated eye exam in the past 12 months? Yes    Have you had a dental exam in the past 12 months? Yes    Are you checking your feet? No      Dietary Intake   Breakfast skips    Snack (morning) chips OR goldfish    Lunch white Retail banker (afternoon) none    Dinner 7pm: roasted vegetables and beans OR pork chop with salad     Snack (evening) none    Beverage(s) black coffee, 64 oz water      Activity / Exercise   Activity / Exercise Type Light (walking / raking leaves)    How many days per week do you exercise? 5    How many minutes per day do you exercise? 30    Total minutes per week of exercise 150      Patient Education   Previous Diabetes Education No    Disease Pathophysiology Definition of diabetes, type 1 and 2, and the diagnosis of diabetes    Healthy Eating Role of diet in the treatment of diabetes and the relationship between the three main macronutrients and blood glucose level;Plate Method;Reviewed blood glucose goals for pre and post meals and how to evaluate the patients' food intake on their blood glucose level.;Meal timing in regards to the patients' current diabetes medication.;Meal options for control of blood glucose level and chronic complications.;Information on hints to eating out and maintain blood glucose control.    Being Active Role of exercise on diabetes management, blood pressure control and cardiac health.;Identified with patient nutritional and/or medication changes necessary with exercise.;Helped patient identify appropriate exercises in relation to his/her diabetes, diabetes complications and other health issue.    Medications Reviewed patients medication for diabetes, action, purpose, timing of dose and side effects.    Monitoring Identified appropriate SMBG and/or A1C goals.;Daily foot exams;Yearly dilated eye exam    Acute complications Taught prevention, symptoms, and  treatment of hypoglycemia - the 15 rule.;Discussed and identified patients' prevention, symptoms, and treatment of hyperglycemia.    Chronic complications Relationship between chronic complications and blood glucose control;Assessed and discussed foot care and prevention of foot problems;Lipid levels, blood glucose control and heart disease;Identified and discussed with patient  current chronic complications;Dental  care;Retinopathy and reason for yearly dilated eye exams;Nephropathy, what it is, prevention of, the use of ACE, ARB's and early detection of through urine microalbumia.;Reviewed with patient heart disease, higher risk of, and prevention    Diabetes Stress and Support Identified and addressed patients feelings and concerns about diabetes;Worked with patient to identify barriers to care and solutions;Role of stress on diabetes    Lifestyle and Health Coping Lifestyle issues that need to be addressed for better diabetes care      Individualized Goals (developed by patient)   Nutrition General guidelines for healthy choices and portions discussed    Physical Activity Exercise 5-7 days per week;30 minutes per day    Medications take my medication as prescribed    Monitoring  Test my blood glucose as discussed    Problem Solving Eating Pattern    Reducing Risk examine blood glucose patterns;do foot checks daily;treat hypoglycemia with 15 grams of carbs if blood glucose less than /dL  Health Coping Ask for help with psychological, social, or emotional issues      Post-Education Assessment   Patient understands the diabetes disease and treatment process. Comprehends key points    Patient understands incorporating nutritional management into lifestyle. Comprehends key points    Patient undertands incorporating physical activity into lifestyle. Demonstrates understanding / competency    Patient understands using medications safely. Demonstrates understanding / competency    Patient understands monitoring blood glucose, interpreting and using results Comprehends key points    Patient understands prevention, detection, and treatment of acute complications. Comprehends key points    Patient understands prevention, detection, and treatment of chronic complications. Comprehends key points    Patient understands how to develop strategies to address psychosocial issues. Comprehends key points    Patient  understands how to develop strategies to promote health/change behavior. Comprehends key points      Outcomes   Expected Outcomes Demonstrated interest in learning. Expect positive outcomes    Future DMSE 3-4 months    Program Status Not Completed             Individualized Plan for Diabetes Self-Management Training:   Learning Objective:  Patient will have a greater understanding of diabetes self-management. Patient education plan is to attend individual and/or group sessions per assessed needs and concerns.   Plan:   Patient Instructions  Goal: start eating breakfast (ex: protein shake and fruit, boiled egg on toast, apple and peanut butter)  Goal: start testing your blood sugar (fasting in morning, and 2 hours after a meal)  Ranges:  Fasting: 80-130mg /dL 2 hours post-meal: 180mg /dL  Aim to eat within 1-2 hours of waking up and every 3-5 hours following. Avoid skipping meals.   At snacks aim to include a carb and protein.   Aim to make 1/2 of your plate vegetables as often as possible.   Aim for 150 minutes of physical activity weekly. Continue your current exercise regimen!   Expected Outcomes:  Demonstrated interest in learning. Expect positive outcomes  Education material provided: ADA - How to Thrive: A Guide for Your Journey with Diabetes  If problems or questions, patient to contact team via:  Phone  Future DSME appointment: 3-4 months

## 2023-04-09 ENCOUNTER — Encounter: Payer: Self-pay | Admitting: Gastroenterology

## 2023-04-09 ENCOUNTER — Ambulatory Visit: Payer: BC Managed Care – PPO | Admitting: Gastroenterology

## 2023-04-09 VITALS — BP 138/78 | HR 82 | Ht 69.0 in | Wt 239.0 lb

## 2023-04-09 DIAGNOSIS — Z86010 Personal history of colonic polyps: Secondary | ICD-10-CM

## 2023-04-09 DIAGNOSIS — K648 Other hemorrhoids: Secondary | ICD-10-CM

## 2023-04-09 MED ORDER — PEG 3350-KCL-NA BICARB-NACL 420 G PO SOLR: 4000.0000 mL | Freq: Once | ORAL | 0 refills | Status: AC

## 2023-04-09 NOTE — Progress Notes (Signed)
[facility] Consult Note:  History: Russell Peters 04/09/2023  Referring provider: Ronnald Nian, MD  Reason for consult/chief complaint: Colonoscopy (Discuss colonoscopy, no concerns)   Subjective  HPI: For screening colonoscopy with me February 2021 (when he was also having incidental painless rectal bleeding): 7-8 subcentimeter tubular adenomas, diverticulosis and internal hemorrhoids.  3-year recall recommended due to polyp findings and prep that was initially fair and then improved with lavage.  More extensive prep recommended for next exam. _____________  Today, he reports feeling well overall. He reports having a typically normal BM and states his stool in formed and consistent. He complains of hemorrhoids that prolapse and typically occur when having a BM. He also complains of fecal incontinence which he describes as seepage stool stool. He denies experiencing any bleeding with the hemorrhoids.  He denies any dysphagia, vomiting, abdominal pain, nausea, blood in stool or unexpected weight or appetite changes.    ROS:  Review of Systems  Constitutional:  Negative for appetite change.  HENT:  Negative for trouble swallowing.   Respiratory:  Negative for cough and shortness of breath.   Cardiovascular:  Negative for chest pain.  Gastrointestinal:  Negative for abdominal distention, abdominal pain, anal bleeding, blood in stool, constipation, diarrhea, nausea, rectal pain and vomiting.  Genitourinary:  Negative for dysuria.  Musculoskeletal:  Negative for back pain.  Skin:  Negative for rash.  Neurological:  Negative for weakness.  All other systems reviewed and are negative.    Past Medical History: Past Medical History:  Diagnosis Date   ADD (attention deficit disorder)    Allergy    Anxiety    Chronic kidney disease    kidney stone years ago   Depression    GERD (gastroesophageal reflux disease)    Hyperlipidemia    Hypertension     Sleep apnea    wears Cpap   Thyroid disease      Past Surgical History: Past Surgical History:  Procedure Laterality Date   NECK SURGERY     NO PAST SURGERIES     PUNCH BIOPSY OF SKIN N/A 01/03/2022   picker's nodule   SKIN BIOPSY Bilateral 11/20/2021   prurio nodularis and scar of the scalp   SKIN BIOPSY Left 01/03/2022   neurofibroma     Family History: Family History  Problem Relation Age of Onset   Rheum arthritis Mother    Esophageal cancer Father    Colon cancer Maternal Grandfather    Rectal cancer Neg Hx    Stomach cancer Neg Hx     Social History: Social History   Socioeconomic History   Marital status: Married    Spouse name: Not on file   Number of children: Not on file   Years of education: Not on file   Highest education level: Not on file  Occupational History   Not on file  Tobacco Use   Smoking status: Former    Years: 5    Types: Cigarettes    Quit date: 12/23/1998    Years since quitting: 24.3   Smokeless tobacco: Never  Vaping Use   Vaping Use: Never used  Substance and Sexual Activity   Alcohol use: Yes    Comment: 3 beers weekly in past 8 months; 1 pt weekly liquor   Drug use: No   Sexual activity: Not Currently  Other Topics Concern   Not on file  Social History Narrative   Not on file   Social Determinants of Health  Financial Resource Strain: Not on file  Food Insecurity: Not on file  Transportation Needs: Not on file  Physical Activity: Not on file  Stress: Not on file  Social Connections: Not on file    Allergies: Allergies  Allergen Reactions   Penicillins     Unknown reaction.     Outpatient Meds: Current Outpatient Medications  Medication Sig Dispense Refill   Accu-Chek Softclix Lancets lancets 1 each by Other route daily. Use as instructed to check blood sugar 100 each 3   amphetamine-dextroamphetamine (ADDERALL) 30 MG tablet TAKE 1 TABLET BY MOUTH 2 TIMES DAILY. 60 tablet 0   empagliflozin (JARDIANCE) 10  MG TABS tablet Take 1 tablet (10 mg total) by mouth daily before breakfast. 90 tablet 1   fluocinolone (SYNALAR) 0.01 % external solution SMARTSIG:Drop(s) Topical 1-2 Times Daily     glucose blood test strip 1 each by Other route as needed for other. Use as instructed to check blood sugar 100 each 3   levothyroxine (SYNTHROID) 88 MCG tablet TAKE 1 TABLET (88 MCG TOTAL) BY MOUTH DAILY. 90 tablet 0   lisinopril-hydrochlorothiazide (ZESTORETIC) 20-12.5 MG tablet TAKE 1 TABLET BY MOUTH DAILY. 30 tablet 3   omeprazole (PRILOSEC) 20 MG capsule Take 1 capsule (20 mg total) by mouth daily.     rosuvastatin (CRESTOR) 40 MG tablet TAKE 1 TABLET (40 MG TOTAL) BY MOUTH DAILY. 30 tablet 3   amLODipine (NORVASC) 5 MG tablet Take 1 tablet (5 mg total) by mouth daily. (Patient not taking: Reported on 04/09/2023) 30 tablet 1   doxycycline (VIBRAMYCIN) 100 MG capsule Take 100 mg by mouth 2 (two) times daily.     No current facility-administered medications for this visit.      ___________________________________________________________________ Objective   Exam:  Ht  (1.753 m)   Wt 239 lb (108.4 kg)   BMI 35.29 kg/m  Wt Readings from Last 3 Encounters:  04/09/23 239 lb (108.4 kg)  04/04/23 241 lb 9.6 oz (109.6 kg)  02/05/23 237 lb (107.5 kg)   Well-appearing CV: RRR, no JVD, no peripheral edema Resp: clear to auscultation bilaterally, normal RR and effort noted GI: soft, no tenderness, with active bowel sounds. No guarding or palpable organomegaly noted.  Labs:    Latest Ref Rng & Units 09/05/2022    4:11 PM 06/26/2021    9:19 AM 07/17/2020    1:39 PM  CBC  WBC 3.4 - 10.8 x10E3/uL 8.6  6.4  8.1   Hemoglobin 13.0 - 17.7 g/dL 16.1  09.6  04.5   Hematocrit 37.5 - 51.0 % 46.7  46.6  48.1   Platelets 150 - 450 x10E3/uL 289  271  276       Latest Ref Rng & Units 09/05/2022    4:11 PM 06/26/2021    9:19 AM 07/17/2020    1:39 PM  CMP  Glucose 70 - 99 mg/dL 409  811  914   BUN 6 - 24 mg/dL Creatinine 0.76 - 1.27 mg/dL 7.82  9.56  2.13   Sodium 134 - 144 mmol/L 137  139  140   Potassium 3.5 - 5.2 mmol/L 4.0  4.8  5.2   Chloride 96 - 106 mmol/L 98  102  99   CO2 20 - 29 mmol/L Calcium 8.7 - 10.2 mg/dL 9.7  9.6  9.9   Total Protein 6.0 - 8.5 g/dL 7.0  6.7  6.7  Total Bilirubin 0.0 - 1.2 mg/dL 1.3  0.5  0.5   Alkaline Phos 44 - 121 IU/L 67  58  81   AST 0 - 40 IU/L ALT 0 - 44 IU/L Radiologic Studies:  Colonoscopy 01-27-20 - Four 4 to 6 mm polyps in the descending colon and in the transverse colon, removed with a cold snare. Resected and retrieved. - Two 5 mm polyps in the descending colon, removed with a cold snare. Resected and retrieved. - Two 6 to 8 mm polyps in the descending colon, removed with a hot snare. Resected and retrieved. - Diverticulosis in the left colon. - Internal hemorrhoids. - The examination was otherwise normal on direct and retroflexion views.  Assessment: Prolapsed internal hemorrhoids  History of colon polyps  Plan: Colonoscopy for polyp surveillance  The benefits and risks of the planned procedure were described in detail with the patient or (when appropriate) their health care proxy.  Risks were outlined as including, but not limited to, bleeding, infection, perforation, adverse medication reaction leading to cardiac or pulmonary decompensation, pancreatitis (if ERCP).  The limitation of incomplete mucosal visualization was also discussed.  No guarantees or warranties were given.  Schedule hemorrhoidal banding appointment 2-3 weeks after colonoscopy .  Banding described in detail along with brochure, and he is amenable. Told him it typically takes 3 sessions.  Thank you for the courtesy of this consult.  Please call me with any questions or concerns.  I,Safa M Kadhim,acting as a scribe for [name], MD.,have documented all relevant documentation on the behalf of Sherrilyn Rist, MD,as  directed by  Sherrilyn Rist, MD while in the presence of Sherrilyn Rist, MD.   Marvis Repress III, MD, have reviewed all documentation for this visit. The documentation on 04/09/23 for the exam, diagnosis, procedures, and orders are all accurate and complete.   CC: Referring provider noted above

## 2023-04-09 NOTE — Patient Instructions (Signed)
_______________________________________________________  If your blood pressure at your visit was 140/90 or greater, please contact your primary care physician to follow up on this.  _______________________________________________________  If you are age 56 or older, your body mass index should be between 23-30. Your Body mass index is 35.29 kg/m. If this is out of the aforementioned range listed, please consider follow up with your Primary Care Provider.  If you are age 40 or younger, your body mass index should be between 19-25. Your Body mass index is 35.29 kg/m. If this is out of the aformentioned range listed, please consider follow up with your Primary Care Provider.   ________________________________________________________  The [facility] providers would like to encourage you to use [facility] to communicate with providers for non-urgent requests or questions.  Due to long hold times on the telephone, sending your provider a message by [facility] may be a faster and more efficient way to get a response.  Please allow 48 business hours for a response.  Please remember that this is for non-urgent requests.  _______________________________________________________  It was a pleasure to see you today!  Thank you for trusting me with your gastrointestinal care!

## 2023-04-09 NOTE — Progress Notes (Deleted)
[facility] Consult Note:  History: Russell Peters 04/09/2023  Referring provider: Ronnald Nian, MD  Reason for consult/chief complaint: Colonoscopy (Discuss colonoscopy, no concerns)   Subjective  HPI: For screening colonoscopy with me February 2021 (when he was also having incidental painless rectal bleeding): 7-8 subcentimeter tubular adenomas, diverticulosis and internal hemorrhoids.  3-year recall recommended due to polyp findings and prep that was initially fair and then improved with lavage.  More extensive prep recommended for next exam. _____________  ***   ROS:  Review of Systems   Past Medical History: Past Medical History:  Diagnosis Date   ADD (attention deficit disorder)    Allergy    Anxiety    Chronic kidney disease    kidney stone years ago   Depression    GERD (gastroesophageal reflux disease)    Hyperlipidemia    Hypertension    Sleep apnea    wears Cpap   Thyroid disease      Past Surgical History: Past Surgical History:  Procedure Laterality Date   NECK SURGERY     NO PAST SURGERIES     PUNCH BIOPSY OF SKIN N/A 01/03/2022   picker's nodule   SKIN BIOPSY Bilateral 11/20/2021   prurio nodularis and scar of the scalp   SKIN BIOPSY Left 01/03/2022   neurofibroma     Family History: Family History  Problem Relation Age of Onset   Rheum arthritis Mother    Esophageal cancer Father    Colon cancer Maternal Grandfather    Rectal cancer Neg Hx    Stomach cancer Neg Hx     Social History: Social History   Socioeconomic History   Marital status: Married    Spouse name: Not on file   Number of children: Not on file   Years of education: Not on file   Highest education level: Not on file  Occupational History   Not on file  Tobacco Use   Smoking status: Former    Years: 5    Types: Cigarettes    Quit date: 12/23/1998    Years since quitting: 24.3   Smokeless tobacco: Never  Vaping Use   Vaping Use:  Never used  Substance and Sexual Activity   Alcohol use: Yes    Comment: 3 beers weekly in past 8 months; 1 pt weekly liquor   Drug use: No   Sexual activity: Not Currently  Other Topics Concern   Not on file  Social History Narrative   Not on file   Social Determinants of Health   Financial Resource Strain: Not on file  Food Insecurity: Not on file  Transportation Needs: Not on file  Physical Activity: Not on file  Stress: Not on file  Social Connections: Not on file    Allergies: Allergies  Allergen Reactions   Penicillins     Unknown reaction.     Outpatient Meds: Current Outpatient Medications  Medication Sig Dispense Refill   Accu-Chek Softclix Lancets lancets 1 each by Other route daily. Use as instructed to check blood sugar 100 each 3   amphetamine-dextroamphetamine (ADDERALL) 30 MG tablet TAKE 1 TABLET BY MOUTH 2 TIMES DAILY. 60 tablet 0   empagliflozin (JARDIANCE) 10 MG TABS tablet Take 1 tablet (10 mg total) by mouth daily before breakfast. 90 tablet 1   fluocinolone (SYNALAR) 0.01 % external solution SMARTSIG:Drop(s) Topical 1-2 Times Daily     glucose blood test strip 1 each by Other route as needed for other. Use as  instructed to check blood sugar 100 each 3   levothyroxine (SYNTHROID) 88 MCG tablet TAKE 1 TABLET (88 MCG TOTAL) BY MOUTH DAILY. 90 tablet 0   lisinopril-hydrochlorothiazide (ZESTORETIC) 20-12.5 MG tablet TAKE 1 TABLET BY MOUTH DAILY. 30 tablet 3   omeprazole (PRILOSEC) 20 MG capsule Take 1 capsule (20 mg total) by mouth daily.     rosuvastatin (CRESTOR) 40 MG tablet TAKE 1 TABLET (40 MG TOTAL) BY MOUTH DAILY. 30 tablet 3   amLODipine (NORVASC) 5 MG tablet Take 1 tablet (5 mg total) by mouth daily. (Patient not taking: Reported on 04/09/2023) 30 tablet 1   doxycycline (VIBRAMYCIN) 100 MG capsule Take 100 mg by mouth 2 (two) times daily.     No current facility-administered medications for this visit.       ___________________________________________________________________ Objective   Exam:  Ht  (1.753 m)   Wt 239 lb (108.4 kg)   BMI 35.29 kg/m  Wt Readings from Last 3 Encounters:  04/09/23 239 lb (108.4 kg)  04/04/23 241 lb 9.6 oz (109.6 kg)  02/05/23 237 lb (107.5 kg)    General: ***  Eyes: sclera anicteric, no redness ENT: oral mucosa moist without lesions, no cervical or supraclavicular lymphadenopathy CV: ***, no JVD, no peripheral edema Resp: clear to auscultation bilaterally, normal RR and effort noted GI: soft, *** tenderness, with active bowel sounds. No guarding or palpable organomegaly noted. Skin; warm and dry, no rash or jaundice noted Neuro: awake, alert and oriented x 3. Normal gross motor function and fluent speech  Labs:  ***  Radiologic Studies:  ***  Assessment: No diagnosis found.  ***  Plan:  ***  Thank you for the courtesy of this consult.  Please call me with any questions or concerns.  [name]  CC: Referring provider noted above

## 2023-04-22 ENCOUNTER — Encounter: Payer: Self-pay | Admitting: Gastroenterology

## 2023-05-01 ENCOUNTER — Encounter: Payer: BC Managed Care – PPO | Admitting: Gastroenterology

## 2023-05-19 ENCOUNTER — Encounter: Payer: Self-pay | Admitting: Certified Registered Nurse Anesthetist

## 2023-05-20 ENCOUNTER — Other Ambulatory Visit: Payer: Self-pay | Admitting: Family Medicine

## 2023-05-20 DIAGNOSIS — E039 Hypothyroidism, unspecified: Secondary | ICD-10-CM

## 2023-05-22 ENCOUNTER — Encounter: Payer: Self-pay | Admitting: Gastroenterology

## 2023-05-22 ENCOUNTER — Ambulatory Visit (AMBULATORY_SURGERY_CENTER): Payer: BC Managed Care – PPO | Admitting: Gastroenterology

## 2023-05-22 VITALS — BP 123/81 | HR 77 | Temp 98.9°F | Resp 16 | Ht 69.0 in | Wt 239.0 lb

## 2023-05-22 DIAGNOSIS — D123 Benign neoplasm of transverse colon: Secondary | ICD-10-CM | POA: Diagnosis not present

## 2023-05-22 DIAGNOSIS — Z1211 Encounter for screening for malignant neoplasm of colon: Secondary | ICD-10-CM | POA: Diagnosis not present

## 2023-05-22 DIAGNOSIS — Z09 Encounter for follow-up examination after completed treatment for conditions other than malignant neoplasm: Secondary | ICD-10-CM

## 2023-05-22 DIAGNOSIS — Z86010 Personal history of colonic polyps: Secondary | ICD-10-CM | POA: Diagnosis not present

## 2023-05-22 MED ORDER — SODIUM CHLORIDE 0.9 % IV SOLN: 500.0000 mL | INTRAVENOUS | Status: DC

## 2023-05-22 NOTE — Patient Instructions (Signed)
Please read handouts provided. Continue present medications. Await pathology results.   YOU HAD AN ENDOSCOPIC PROCEDURE TODAY AT THE [facility]:   Refer to the procedure report that was given to you for any specific questions about what was found during the examination.  If the procedure report does not answer your questions, please call your gastroenterologist to clarify.  If you requested that your care partner not be given the details of your procedure findings, then the procedure report has been included in a sealed envelope for you to review at your convenience later.  YOU SHOULD EXPECT: Some feelings of bloating in the abdomen. Passage of more gas than usual.  Walking can help get rid of the air that was put into your GI tract during the procedure and reduce the bloating. If you had a lower endoscopy (such as a colonoscopy or flexible sigmoidoscopy) you may notice spotting of blood in your stool or on the toilet paper. If you underwent a bowel prep for your procedure, you may not have a normal bowel movement for a few days.  Please Note:  You might notice some irritation and congestion in your nose or some drainage.  This is from the oxygen used during your procedure.  There is no need for concern and it should clear up in a day or so.  SYMPTOMS TO REPORT IMMEDIATELY:  Following lower endoscopy (colonoscopy or flexible sigmoidoscopy):  Excessive amounts of blood in the stool  Significant tenderness or worsening of abdominal pains  Swelling of the abdomen that is new, acute  Fever of 100F or higher  For urgent or emergent issues, a gastroenterologist can be reached at any hour by calling [phone]. Do not use MyChart messaging for urgent concerns.    DIET:  We do recommend a small meal at first, but then you may proceed to your regular diet.  Drink plenty of fluids but you should avoid alcoholic beverages for 24 hours.  ACTIVITY:  You should plan to take it easy for  the rest of today and you should NOT DRIVE or use heavy machinery until tomorrow (because of the sedation medicines used during the test).    FOLLOW UP: Our staff will call the number listed on your records the next business day following your procedure.  We will call around 7:15- 8:00 am to check on you and address any questions or concerns that you may have regarding the information given to you following your procedure. If we do not reach you, we will leave a message.     If any biopsies were taken you will be contacted by phone or by letter within the next 1-3 weeks.  Please call us at [phone] if you have not heard about the biopsies in 3 weeks.    SIGNATURES/CONFIDENTIALITY: You and/or your care partner have signed paperwork which will be entered into your electronic medical record.  These signatures attest to the fact that that the information above on your After Visit Summary has been reviewed and is understood.  Full responsibility of the confidentiality of this discharge information lies with you and/or your care-partner. 

## 2023-05-22 NOTE — Op Note (Signed)
[facility] Patient Name: Russell Peters Procedure Date: 05/22/2023 2:52 PM MRN: [id number] Endoscopist: [name] L. [name] , MD, [id number] Age: 57 Referring MD:  Date of Birth: Jun 05, 1966 Gender: Male Account #: [id number] Procedure:                Colonoscopy Indications:              Surveillance: Personal history of adenomatous                            polyps on last colonoscopy 3 years ago                           TA x 8 in Feb 2021 Medicines:                Monitored Anesthesia Care Procedure:                Pre-Anesthesia Assessment:                           - Prior to the procedure, a History and Physical                            was performed, and patient medications and                            allergies were reviewed. The patient's tolerance of                            previous anesthesia was also reviewed. The risks                            and benefits of the procedure and the sedation                            options and risks were discussed with the patient.                            All questions were answered, and informed consent                            was obtained. Prior Anticoagulants: The patient has                            taken no anticoagulant or antiplatelet agents. ASA                            Grade Assessment: II - A patient with mild systemic                            disease. After reviewing the risks and benefits,                            the patient was deemed in satisfactory condition to  undergo the procedure.                           After obtaining informed consent, the colonoscope                            was passed under direct vision. Throughout the                            procedure, the patient's blood pressure, pulse, and                            oxygen saturations were monitored continuously. The                            Olympus CF-HQ190L ([id number]) Colonoscope was                             introduced through the anus and advanced to the the                            cecum, identified by appendiceal orifice and                            ileocecal valve. The colonoscopy was performed with                            difficulty due to a redundant colon and significant                            looping. Successful completion of the procedure was                            aided by using manual pressure and straightening                            and shortening the scope to obtain bowel loop                            reduction. The patient tolerated the procedure                            well. The quality of the bowel preparation was good                            after lavage of opaque liquid. The ileocecal valve,                            appendiceal orifice, and rectum were photographed.                            The bowel preparation used was GoLYTELY. Scope In: 2:56:46 PM Scope Out: 3:19:14 PM Scope Withdrawal Time: 0 hours 13 minutes 24 seconds  Total Procedure  Duration: 0 hours 22 minutes 28 seconds  Findings:                 The perianal and digital rectal examinations were                            normal.                           Four sessile polyps were found in the transverse                            colon and hepatic flexure. The polyps were 4 to 6                            mm in size. These polyps were removed with a cold                            snare. Resection and retrieval were complete.                           The colon (entire examined portion) was redundant.                           Repeat examination of right colon under NBI                            performed.                           A diminutive polyp was found in the transverse                            colon. The polyp was semi-sessile. The polyp was                            removed with a cold snare. Resection and retrieval                            were  complete.                           Internal hemorrhoids were found during retroflexion.                           The exam was otherwise without abnormality on                            direct and retroflexion views. Complications:            No immediate complications. Estimated Blood Loss:     Estimated blood loss was minimal. Impression:               - Four 4 to 6 mm polyps in the transverse colon and  at the hepatic flexure, removed with a cold snare.                            Resected and retrieved.                           - Redundant colon.                           - One diminutive polyp in the transverse colon,                            removed with a cold snare. Resected and retrieved.                           - Internal hemorrhoids.                           - The examination was otherwise normal on direct                            and retroflexion views. Recommendation:           - Patient has a contact number available for                            emergencies. The signs and symptoms of potential                            delayed complications were discussed with the                            patient. Return to normal activities tomorrow.                            Written discharge instructions were provided to the                            patient.                           - Resume previous diet.                           - Continue present medications.                           - Await pathology results.                           - Repeat colonoscopy is recommended for                            surveillance. The colonoscopy date will be                            determined after pathology results from today's  exam become available for review. (for next exam,                            golytely prep like this time, but start both prep                            times at least an hour earlier to allow time  for                            additional water consumption. Mathias Bogacki L. [name], MD 05/22/2023 3:25:34 PM This report has been signed electronically.

## 2023-05-22 NOTE — Progress Notes (Signed)
History and Physical:  This patient presents for endoscopic testing for: Encounter Diagnosis  Name Primary?   History of colon polyps Yes    Clinical details in 04/09/23 office note.  Hx colon polyps in 2021  Also symptomatic (prolapsing) internal HR  Patient is otherwise without complaints or active issues today.   Past Medical History: Past Medical History:  Diagnosis Date   ADD (attention deficit disorder)    Allergy    Anxiety    Chronic kidney disease    kidney stone years ago   Depression    Diabetes mellitus without complication (HCC)    GERD (gastroesophageal reflux disease)    Hyperlipidemia    Hypertension    Sleep apnea    wears Cpap   Thyroid disease      Past Surgical History: Past Surgical History:  Procedure Laterality Date   NECK SURGERY     NO PAST SURGERIES     PUNCH BIOPSY OF SKIN N/A 01/03/2022   picker's nodule   SKIN BIOPSY Bilateral 11/20/2021   prurio nodularis and scar of the scalp   SKIN BIOPSY Left 01/03/2022   neurofibroma    Allergies: Allergies  Allergen Reactions   Penicillins     Unknown reaction.     Outpatient Meds: Current Outpatient Medications  Medication Sig Dispense Refill   amphetamine-dextroamphetamine (ADDERALL) 30 MG tablet TAKE 1 TABLET BY MOUTH 2 TIMES DAILY. 60 tablet 0   clobetasol (TEMOVATE) 0.05 % external solution Apply 1 Application topically as needed.     empagliflozin (JARDIANCE) 10 MG TABS tablet Take 1 tablet (10 mg total) by mouth daily before breakfast. 90 tablet 1   levothyroxine (SYNTHROID) 88 MCG tablet TAKE 1 TABLET (88 MCG TOTAL) BY MOUTH DAILY. 90 tablet 0   lisinopril-hydrochlorothiazide (ZESTORETIC) 20-12.5 MG tablet TAKE 1 TABLET BY MOUTH DAILY. 30 tablet 3   omeprazole (PRILOSEC) 20 MG capsule Take 1 capsule (20 mg total) by mouth daily.     rosuvastatin (CRESTOR) 40 MG tablet TAKE 1 TABLET (40 MG TOTAL) BY MOUTH DAILY. 30 tablet 3   Accu-Chek Softclix Lancets lancets 1 each by Other route  daily. Use as instructed to check blood sugar 100 each 3   fluocinolone (SYNALAR) 0.01 % external solution SMARTSIG:Drop(s) Topical 1-2 Times Daily (Patient not taking: Reported on 05/22/2023)     glucose blood test strip 1 each by Other route as needed for other. Use as instructed to check blood sugar 100 each 3   Current Facility-Administered Medications  Medication Dose Route Frequency Provider Last Rate Last Admin   0.9 %  sodium chloride infusion  500 mL Intravenous Continuous Danis, Starr Lake III, MD          ___________________________________________________________________ Objective   Exam:  BP 131/85   Pulse 83   Temp 98.9 F (37.2 C) (Temporal)   Ht 5\' 9"  (1.753 m)   Wt 239 lb (108.4 kg)   SpO2 99%   BMI 35.29 kg/m   CV: regular , S1/S2 Resp: clear to auscultation bilaterally, normal RR and effort noted GI: soft, no tenderness, with active bowel sounds.   Assessment: Encounter Diagnosis  Name Primary?   History of colon polyps Yes     Plan: Colonoscopy   The benefits and risks of the planned procedure were described in detail with the patient or (when appropriate) their health care proxy.  Risks were outlined as including, but not limited to, bleeding, infection, perforation, adverse medication reaction leading to cardiac or pulmonary decompensation,  pancreatitis (if ERCP).  The limitation of incomplete mucosal visualization was also discussed.  No guarantees or warranties were given.  The patient is appropriate for an endoscopic procedure in the ambulatory setting.   - Amada Jupiter, MD

## 2023-05-22 NOTE — Progress Notes (Signed)
Called to room to assist during endoscopic procedure.  Patient ID and intended procedure confirmed with present staff. Received instructions for my participation in the procedure from the performing physician.  

## 2023-05-23 ENCOUNTER — Telehealth: Payer: Self-pay

## 2023-05-23 NOTE — Telephone Encounter (Signed)
Follow up call placed, no answer and no VM. 

## 2023-05-26 ENCOUNTER — Other Ambulatory Visit: Payer: Self-pay | Admitting: Family Medicine

## 2023-05-26 DIAGNOSIS — E039 Hypothyroidism, unspecified: Secondary | ICD-10-CM

## 2023-05-27 ENCOUNTER — Encounter: Payer: Self-pay | Admitting: Gastroenterology

## 2023-06-23 ENCOUNTER — Other Ambulatory Visit: Payer: Self-pay | Admitting: Family Medicine

## 2023-06-23 NOTE — Telephone Encounter (Signed)
Please review refill request for Dr. Susann Givens.

## 2023-06-25 ENCOUNTER — Ambulatory Visit: Payer: BC Managed Care – PPO | Admitting: Gastroenterology

## 2023-06-25 ENCOUNTER — Encounter: Payer: Self-pay | Admitting: Gastroenterology

## 2023-06-25 VITALS — BP 130/84 | HR 88 | Ht 69.0 in | Wt 231.5 lb

## 2023-06-25 DIAGNOSIS — K648 Other hemorrhoids: Secondary | ICD-10-CM

## 2023-06-25 NOTE — Patient Instructions (Signed)
_______________________________________________________  If your blood pressure at your visit was 140/90 or greater, please contact your primary care physician to follow up on this.  _______________________________________________________  If you are age 56 or older, your body mass index should be between 23-30. Your Body mass index is 34.19 kg/m. If this is out of the aforementioned range listed, please consider follow up with your Primary Care Provider.  If you are age 58 or younger, your body mass index should be between 19-25. Your Body mass index is 34.19 kg/m. If this is out of the aformentioned range listed, please consider follow up with your Primary Care Provider.   ________________________________________________________  The [place] [facility] providers would like to encourage you to use [facility] to communicate with providers for non-urgent requests or questions.  Due to long hold times on the telephone, sending your provider a message by [facility] may be a faster and more efficient way to get a response.  Please allow 48 business hours for a response.  Please remember that this is for non-urgent requests.  _______________________________________________________  [place] [facility] PROCEDURE    FOLLOW-UP CARE   The procedure you have had should have been relatively painless since the banding of the area involved does not have nerve endings and there is no pain sensation.  The rubber band cuts off the blood supply to the hemorrhoid and the band may fall off as soon as 48 hours after the banding (the band may occasionally be seen in the toilet bowl following a bowel movement). You may notice a temporary feeling of fullness in the rectum which should respond adequately to plain Tylenol or Motrin.  Following the banding, avoid strenuous exercise that evening and resume full activity the next day.  A sitz bath (soaking in a warm tub) or bidet is soothing, and can be useful for cleansing the area  after bowel movements.     To avoid constipation, take two tablespoons of natural wheat bran, natural oat bran, flax, Benefiber or any over the counter fiber supplement and increase your water intake to 7-8 glasses daily.    Unless you have been prescribed anorectal medication, do not put anything inside your rectum for two weeks: No suppositories, enemas, fingers, etc.  Occasionally, you may have more bleeding than usual after the banding procedure.  This is often from the untreated hemorrhoids rather than the treated one.  Don't be concerned if there is a tablespoon or so of blood.  If there is more blood than this, lie flat with your bottom higher than your head and apply an ice pack to the area. If the bleeding does not stop within a half an hour or if you feel faint, call our office at [phone] or go to the emergency room.  Problems are not common; however, if there is a substantial amount of bleeding, severe pain, chills, fever or difficulty passing urine (very rare) or other problems, you should call us at [phone] or report to the nearest emergency room.  Do not stay seated continuously for more than 2-3 hours for a day or two after the procedure.  Tighten your buttock muscles 10-15 times every two hours and take 10-15 deep breaths every 1-2 hours.  Do not spend more than a few minutes on the toilet if you cannot empty your bowel; instead re-visit the toilet at a later time.   It was a pleasure to see you today!  Thank you for trusting me with your gastrointestinal care!

## 2023-06-25 NOTE — Progress Notes (Signed)
[facility] Progress Note  Chief Complaint: Prolapsing internal hemorrhoids  Subjective  History: Russell Peters was last seen for surveillance colonoscopy on 05/22/2023.  Complete exam, good (GoLytely) prep after lavage, 5 subcentimeter tubular adenomas removed, internal hemorrhoids discovered.  At that time, he was reporting intermittent hemorrhoidal prolapsing with fecal incontinence and requested further evaluation for consideration of banding.  Problem has continued much the same as before and it is an aggravation.  He does not get bleeding or pain, but requests treatment of the hemorrhoids for the above-noted symptoms.  ROS: Cardiovascular:  no chest pain Respiratory: no dyspnea  The patient's Past Medical, Family and Social History were reviewed and are on file in the EMR.  Objective:  Med list reviewed  Current Outpatient Medications:    Accu-Chek Softclix Lancets lancets, 1 each by Other route daily. Use as instructed to check blood sugar, Disp: 100 each, Rfl: 3   amphetamine-dextroamphetamine (ADDERALL) 30 MG tablet, TAKE 1 TABLET BY MOUTH 2 TIMES DAILY., Disp: 60 tablet, Rfl: 0   clobetasol (TEMOVATE) 0.05 % external solution, Apply 1 Application topically as needed., Disp: , Rfl:    empagliflozin (JARDIANCE) 10 MG TABS tablet, Take 1 tablet (10 mg total) by mouth daily before breakfast., Disp: 90 tablet, Rfl: 1   fluocinolone (SYNALAR) 0.01 % external solution, , Disp: , Rfl:    glucose blood test strip, 1 each by Other route as needed for other. Use as instructed to check blood sugar, Disp: 100 each, Rfl: 3   levothyroxine (SYNTHROID) 88 MCG tablet, TAKE 1 TABLET (88 MCG TOTAL) BY MOUTH DAILY., Disp: 90 tablet, Rfl: 0   lisinopril-hydrochlorothiazide (ZESTORETIC) 20-12.5 MG tablet, TAKE 1 TABLET BY MOUTH DAILY., Disp: 30 tablet, Rfl: 3   omeprazole (PRILOSEC) 20 MG capsule, Take 1 capsule (20 mg total) by mouth daily., Disp: , Rfl:    rosuvastatin (CRESTOR) 40 MG tablet,  TAKE 1 TABLET (40 MG TOTAL) BY MOUTH DAILY., Disp: 30 tablet, Rfl: 3   Vital signs in last 24 hrs: Vitals:   06/25/23 0825  BP: 130/84  Pulse: 88   Wt Readings from Last 3 Encounters:  06/25/23 231 lb 8 oz (105 kg)  05/22/23 239 lb (108.4 kg)  04/09/23 239 lb (108.4 kg)    Physical Exam Well-appearing Abdomen soft and nontender Anoscopy: Grade 2 internal hemorrhoids, right greater than left DRE normal with normal resting and voluntary sphincter tone   After discussion of hemorrhoidal banding and its risks and benefits, he wished to proceed.  Russell Peters, CMA was present for the entire procedure. PROCEDURE NOTE: The patient presents with symptomatic grade 2  hemorrhoids, requesting rubber band ligation of his/her hemorrhoidal disease.  All risks, benefits and alternative forms of therapy were described and informed consent was obtained.    The anorectum was pre-medicated with 0.125% NTG and lubricant. The decision was made to band the RP internal hemorrhoids, and the [facility] O'Regan System was used to perform band ligation without complication.  Digital anorectal examination was then performed to assure proper positioning of the band, and to adjust the banded tissue as required.  The patient was discharged home without pain or other issues.  Dietary and behavioral recommendations were given and along with follow-up instructions.     The following adjunctive treatments were recommended:  None  The patient will return next available appointment for  follow-up and possible additional banding as required. No complications were encountered and the patient tolerated the procedure well.  Russell Peters  Russell Neither, MD   _____________________________________________ Assessment & Plan  Assessment: Encounter Diagnosis  Name Primary?   Prolapsed hemorrhoids Yes    Banding appointment #2 scheduled   Russell Peters

## 2023-07-08 ENCOUNTER — Other Ambulatory Visit: Payer: Self-pay | Admitting: Family Medicine

## 2023-07-08 DIAGNOSIS — I152 Hypertension secondary to endocrine disorders: Secondary | ICD-10-CM

## 2023-07-10 ENCOUNTER — Ambulatory Visit: Payer: BC Managed Care – PPO | Admitting: Family Medicine

## 2023-07-10 ENCOUNTER — Encounter: Payer: Self-pay | Admitting: Family Medicine

## 2023-07-10 VITALS — BP 128/88 | HR 82 | Temp 97.9°F | Resp 16 | Wt 235.8 lb

## 2023-07-10 DIAGNOSIS — E785 Hyperlipidemia, unspecified: Secondary | ICD-10-CM

## 2023-07-10 DIAGNOSIS — E66812 Obesity, class 2: Secondary | ICD-10-CM

## 2023-07-10 DIAGNOSIS — G4733 Obstructive sleep apnea (adult) (pediatric): Secondary | ICD-10-CM

## 2023-07-10 DIAGNOSIS — E669 Obesity, unspecified: Secondary | ICD-10-CM

## 2023-07-10 DIAGNOSIS — I10 Essential (primary) hypertension: Secondary | ICD-10-CM | POA: Diagnosis not present

## 2023-07-10 DIAGNOSIS — E1169 Type 2 diabetes mellitus with other specified complication: Secondary | ICD-10-CM | POA: Diagnosis not present

## 2023-07-10 LAB — POCT GLYCOSYLATED HEMOGLOBIN (HGB A1C): Hemoglobin A1C: 6.6 % — AB (ref 4.0–5.6)

## 2023-07-10 MED ORDER — SEMAGLUTIDE(0.25 OR 0.5MG/DOS) 2 MG/3ML ~~LOC~~ SOPN
0.2500 mg | PEN_INJECTOR | SUBCUTANEOUS | 1 refills | Status: DC
Start: 2023-07-10 — End: 2023-07-28

## 2023-07-10 NOTE — Patient Instructions (Signed)
Me in a month to let me know how you are doing and if you are doing fine I will increase her medicine

## 2023-07-10 NOTE — Progress Notes (Signed)
Subjective:    Patient ID: LISANDRO MEGGETT, male    DOB: [birth year], 57 y.o.   MRN: [id number]  MOO GRAVLEY is a 57 y.o. male who presents for follow-up of Type 2 diabetes mellitus.  Home blood sugar records: fasting range: 120 average Current symptoms/problems include none and have been stable. Daily foot checks: yes   Any foot concerns: no How often blood sugars checked: occasionally Exercise: Home exercise routine includes walking. Diet: regular diet He is taking Jardiance and did have 1 episode of yeast infection that he treated topically with Lamisil which worked quite nicely.  He continues on lisinopril/HCTZ as well as rosuvastatin.  He does keep himself physically active.  He got married in October and things are going quite well there as well as at work.  At the end of the encounter he then mention the fact that he did have a history of OSA but stopped using his CPAP approximately 3 years ago.  His wife now notes that he does stop breathing and he wants to start back on the CPAP. The following portions of the patient's history were reviewed and updated as appropriate: allergies, current medications, past medical history, past social history and problem list.  ROS as in subjective above.     Objective:    Physical Exam Alert and in no distress otherwise not examined. Hemoglobin A1c is 6.6 Blood pressure 128/88, pulse 82, temperature 97.9 F (36.6 C), temperature source Oral, resp. rate 16, weight 235 lb 12.8 oz (107 kg), SpO2 96%.  Lab Review    Latest Ref Rng & Units 02/05/2023   11:51 AM 09/05/2022    4:38 PM 09/05/2022    4:37 PM 09/05/2022    4:11 PM 01/14/2022   10:22 AM  Diabetic Labs  HbA1c 4.0 - 5.6 % 6.9   6.1   6.0   Microalbumin mg/L  60.9      Micro/Creat Ratio   72.5      Chol 100 - 199 mg/dL    366    HDL >44 mg/dL    65    Calc LDL 0 - 99 mg/dL    65    Triglycerides 0 - 149 mg/dL    61    Creatinine 0.34 - 1.27 mg/dL    7.42        5/95/6387     9:18 AM 07/10/2023    9:07 AM 06/25/2023    8:25 AM 05/22/2023    3:42 PM 05/22/2023    3:32 PM  BP/Weight  Systolic BP 128 140 130 123 118  Diastolic BP 88 100 84 81 85  Wt. (Lbs)  235.8 231.5    BMI  34.82 kg/m2 34.19 kg/m2        Latest Ref Rng & Units 11/06/2022   12:00 AM 06/26/2021    8:15 AM  Foot/eye exam completion dates  Eye Exam No Retinopathy No Retinopathy    Foot Form Completion   Done    Estiben  reports that he quit smoking about 24 years ago. His smoking use included cigarettes. He started smoking about 29 years ago. He has never used smokeless tobacco. He reports current alcohol use. He reports that he does not use drugs.     Assessment & Plan:    Type 2 diabetes mellitus with other specified complication, without long-term current use of insulin (HCC) - Plan: POCT glycosylated hemoglobin (Hb A1C)  Obesity, Class II, BMI 35-39.9  Hypertension, unspecified type  Hyperlipidemia associated  with type 2 diabetes mellitus (HCC) Good discussion with him concerning his diabetes, I will switch him from Jardiance to Ozempic.  Discussed possible side effects of the medication with him.  He is to call me in 1 month to let me know how he is doing.  I will slowly increase this to help not only with his diabetes but also with his weight. I did write a prescription concerning his OSA explaining that he needs to get back on his CPAP however the machine would have had to been 56 years old and he might possibly need another sleep study.  He will check with the DME company and keep me informed.

## 2023-07-14 ENCOUNTER — Telehealth: Payer: Self-pay | Admitting: Family Medicine

## 2023-07-14 NOTE — Telephone Encounter (Signed)
Records faxed in for P.A.

## 2023-07-14 NOTE — Telephone Encounter (Signed)
P.A. OZEMPIC (Key: [id number]) Rx #: [id number] Ozempic (0.25 or 0.5 MG/DOSE) 2MG /3ML pen-injectors Form [employer] [place] [employer] Form

## 2023-07-14 NOTE — Telephone Encounter (Signed)
Approved. . Authorization Expiration Date: July 13, 2024.  Sent mychart message

## 2023-07-15 ENCOUNTER — Ambulatory Visit: Payer: BC Managed Care – PPO | Admitting: Dietician

## 2023-07-22 ENCOUNTER — Ambulatory Visit: Payer: BC Managed Care – PPO | Admitting: Podiatry

## 2023-07-22 ENCOUNTER — Encounter: Payer: Self-pay | Admitting: Podiatry

## 2023-07-22 DIAGNOSIS — E559 Vitamin D deficiency, unspecified: Secondary | ICD-10-CM | POA: Diagnosis not present

## 2023-07-22 DIAGNOSIS — M21621 Bunionette of right foot: Secondary | ICD-10-CM | POA: Diagnosis not present

## 2023-07-22 NOTE — Progress Notes (Signed)
  Subjective:  Patient ID: Russell Peters, male    DOB: 20-Feb-1966,  MRN: [id number]  Chief Complaint  Patient presents with   Bunions    "I want to schedule a surgery that I should have had a while back."    57 y.o. male presents with the above complaint. History confirmed with patient.  Was unable to proceed with surgery last year, still has been painful especially with recent walking and travel.  Has been wearing a pad.  He would like to reschedule surgery.  Objective:  Physical Exam: warm, good capillary refill, no trophic changes or ulcerative lesions, normal DP and PT pulses, normal sensory exam, and right foot he has a palpable tailor's bunion on the distal lateral fifth metatarsal with hyperkeratosis plantar and lateral to this.   Radiographs: Multiple views x-ray of the right foot: There is a tailor's bunions with prominent bone and soft tissue on the lateral fifth metatarsal Assessment:   1. Tailor's bunion of right foot   2. Vitamin D deficiency      Plan:  Patient was evaluated and treated and all questions answered.  Here today to reschedule his tailor's bunionectomy with fifth metatarsal osteotomy.  We discussed the risk benefits and potential complications of the procedure including but not limited to pain, swelling, infection, scar, numbness which may be temporary or permanent, chronic pain, stiffness, nerve pain or damage, wound healing problems, bone healing problems including delayed or non-union.  He understands and wishes to proceed.  All questions were addressed.  Informed consent was signed and reviewed.  Order for vitamin D given and he will have this checked at [facility]   Surgical plan:  Procedure: -Right foot tailor's bunionectomy with fifth metatarsal osteotomy  Location: -[facility]  Anesthesia plan: -IV sedation with local  Postoperative pain plan: - Tylenol 1000 mg every 6 hours, ibuprofen 600 mg every 6 hours, gabapentin 300 mg every 8 hours x5  days, oxycodone 5 mg 1-2 tabs every 6 hours only as needed  DVT prophylaxis: -None required  WB Restrictions / DME needs: -WBAT in Short cam boot he will bring to surgery    No follow-ups on file.

## 2023-07-28 ENCOUNTER — Encounter: Payer: Self-pay | Admitting: Family Medicine

## 2023-07-28 DIAGNOSIS — E1169 Type 2 diabetes mellitus with other specified complication: Secondary | ICD-10-CM

## 2023-07-28 MED ORDER — SEMAGLUTIDE(0.25 OR 0.5MG/DOS) 2 MG/3ML ~~LOC~~ SOPN
0.2500 mg | PEN_INJECTOR | SUBCUTANEOUS | 0 refills | Status: DC
Start: 2023-07-28 — End: 2023-09-03

## 2023-07-28 MED ORDER — SEMAGLUTIDE(0.25 OR 0.5MG/DOS) 2 MG/3ML ~~LOC~~ SOPN
0.2500 mg | PEN_INJECTOR | SUBCUTANEOUS | 0 refills | Status: DC
Start: 2023-07-28 — End: 2023-07-28

## 2023-07-30 DIAGNOSIS — E559 Vitamin D deficiency, unspecified: Secondary | ICD-10-CM | POA: Diagnosis not present

## 2023-08-06 ENCOUNTER — Ambulatory Visit: Payer: BC Managed Care – PPO | Admitting: Gastroenterology

## 2023-08-06 ENCOUNTER — Encounter: Payer: Self-pay | Admitting: Gastroenterology

## 2023-08-06 VITALS — BP 136/70 | HR 70 | Ht 69.0 in | Wt 239.0 lb

## 2023-08-06 DIAGNOSIS — K648 Other hemorrhoids: Secondary | ICD-10-CM

## 2023-08-06 NOTE — Progress Notes (Signed)
PROCEDURE NOTE: The patient presents with symptomatic grade 2  hemorrhoids, requesting rubber band ligation of his/her hemorrhoidal disease.  All risks, benefits and alternative forms of therapy were described and informed consent was obtained.  DRE revealed: nml   The anorectum was pre-medicated with 0.125% NTG and lubricant. The decision was made to band the RA internal hemorrhoids, and the [facility] O'Regan System was used to perform band ligation without complication.  Digital anorectal examination was then performed to assure proper positioning of the band, and to adjust the banded tissue as required.  The patient was discharged home without pain or other issues.  Dietary and behavioral recommendations were given and along with follow-up instructions.     The following adjunctive treatments were recommended:  Use stool softener nightly for next few days because he reported having rectal pain and constipation for 2 to 3 days after the initial banding.  The patient will return several weeks for  follow-up and possible additional banding as required.  (Left sided column) No complications were encountered and the patient tolerated the procedure well.  Amada Jupiter, MD

## 2023-08-06 NOTE — Patient Instructions (Signed)
_______________________________________________________  If your blood pressure at your visit was 140/90 or greater, please contact your primary care physician to follow up on this.  _______________________________________________________  If you are age 56 or older, your body mass index should be between 23-30. Your Body mass index is 35.29 kg/m. If this is out of the aforementioned range listed, please consider follow up with your Primary Care Provider.  If you are age 4 or younger, your body mass index should be between 19-25. Your Body mass index is 35.29 kg/m. If this is out of the aformentioned range listed, please consider follow up with your Primary Care Provider.   ________________________________________________________  The [place] [facility] providers would like to encourage you to use [facility] to communicate with providers for non-urgent requests or questions.  Due to long hold times on the telephone, sending your provider a message by [facility] may be a faster and more efficient way to get a response.  Please allow 48 business hours for a response.  Please remember that this is for non-urgent requests.  _______________________________________________________  [place] [facility] PROCEDURE    FOLLOW-UP CARE   The procedure you have had should have been relatively painless since the banding of the area involved does not have nerve endings and there is no pain sensation.  The rubber band cuts off the blood supply to the hemorrhoid and the band may fall off as soon as 48 hours after the banding (the band may occasionally be seen in the toilet bowl following a bowel movement). You may notice a temporary feeling of fullness in the rectum which should respond adequately to plain Tylenol or Motrin.  Following the banding, avoid strenuous exercise that evening and resume full activity the next day.  A sitz bath (soaking in a warm tub) or bidet is soothing, and can be useful for cleansing the area  after bowel movements.     To avoid constipation, take two tablespoons of natural wheat bran, natural oat bran, flax, Benefiber or any over the counter fiber supplement and increase your water intake to 7-8 glasses daily.    Unless you have been prescribed anorectal medication, do not put anything inside your rectum for two weeks: No suppositories, enemas, fingers, etc.  Occasionally, you may have more bleeding than usual after the banding procedure.  This is often from the untreated hemorrhoids rather than the treated one.  Don't be concerned if there is a tablespoon or so of blood.  If there is more blood than this, lie flat with your bottom higher than your head and apply an ice pack to the area. If the bleeding does not stop within a half an hour or if you feel faint, call our office at [phone] or go to the emergency room.  Problems are not common; however, if there is a substantial amount of bleeding, severe pain, chills, fever or difficulty passing urine (very rare) or other problems, you should call us at [phone] or report to the nearest emergency room.  Do not stay seated continuously for more than 2-3 hours for a day or two after the procedure.  Tighten your buttock muscles 10-15 times every two hours and take 10-15 deep breaths every 1-2 hours.  Do not spend more than a few minutes on the toilet if you cannot empty your bowel; instead re-visit the toilet at a later time.    Take colace for the next few days.   It was a pleasure to see you today!  Thank  you for trusting me with your gastrointestinal care!

## 2023-08-28 ENCOUNTER — Other Ambulatory Visit: Payer: Self-pay | Admitting: Medical

## 2023-08-28 ENCOUNTER — Other Ambulatory Visit: Payer: Self-pay | Admitting: Family Medicine

## 2023-08-28 DIAGNOSIS — E039 Hypothyroidism, unspecified: Secondary | ICD-10-CM

## 2023-08-28 NOTE — Telephone Encounter (Signed)
Is this okay to refill? 

## 2023-09-02 ENCOUNTER — Telehealth: Payer: Self-pay | Admitting: Podiatry

## 2023-09-02 NOTE — Telephone Encounter (Signed)
DOS-09/26/2023  METATARSAL OSTEOTOMY [id number]  [employer] EFFECTIVE DATE- 03/24/23 DEDUCTIBLE- $2,000 WITH REMAINING $0.00 OOP- $6250.00 WITH REMAINING 3704.89 COINSURANCE- 20%  PER [employer] WEBSITE CPT CODE 75643 WAS APPROVED, AUTH #: [id number] GOOD FROM 09/26/23-12/02-24.

## 2023-09-03 ENCOUNTER — Encounter: Payer: Self-pay | Admitting: Family Medicine

## 2023-09-03 DIAGNOSIS — E1169 Type 2 diabetes mellitus with other specified complication: Secondary | ICD-10-CM

## 2023-09-03 MED ORDER — SEMAGLUTIDE(0.25 OR 0.5MG/DOS) 2 MG/3ML ~~LOC~~ SOPN: 0.5000 mg | PEN_INJECTOR | SUBCUTANEOUS | 0 refills | Status: DC

## 2023-09-09 ENCOUNTER — Encounter: Payer: Self-pay | Admitting: Gastroenterology

## 2023-09-09 ENCOUNTER — Ambulatory Visit: Payer: BC Managed Care – PPO | Admitting: Gastroenterology

## 2023-09-09 VITALS — BP 132/80 | HR 84 | Ht 69.0 in | Wt 241.0 lb

## 2023-09-09 DIAGNOSIS — K648 Other hemorrhoids: Secondary | ICD-10-CM | POA: Diagnosis not present

## 2023-09-09 NOTE — Progress Notes (Signed)
Reports no further bleeding since last banding.   PROCEDURE NOTE: The patient presents with symptomatic grade 2  hemorrhoids, requesting rubber band ligation of his/her hemorrhoidal disease.  All risks, benefits and alternative forms of therapy were described and informed consent was obtained.  DRE revealed: nml   The anorectum was pre-medicated with 0.125% NTG and lubricant. The decision was made to band the LL internal hemorrhoids, and the [facility] O'Regan System was used to perform band ligation without complication.  Digital anorectal examination was then performed to assure proper positioning of the band, and to adjust the banded tissue as required.  The patient was discharged home without pain or other issues.  Dietary and behavioral recommendations were given and along with follow-up instructions.     The following adjunctive treatments were recommended:  none  The patient will return prn  for  follow-up and possible additional banding as required. No complications were encountered and the patient tolerated the procedure well.  Amada Jupiter, MD

## 2023-09-13 ENCOUNTER — Encounter: Payer: Self-pay | Admitting: Family Medicine

## 2023-09-20 ENCOUNTER — Other Ambulatory Visit: Payer: Self-pay | Admitting: Family Medicine

## 2023-09-20 DIAGNOSIS — E1169 Type 2 diabetes mellitus with other specified complication: Secondary | ICD-10-CM

## 2023-09-26 ENCOUNTER — Other Ambulatory Visit: Payer: Self-pay | Admitting: Podiatry

## 2023-09-26 DIAGNOSIS — D2371 Other benign neoplasm of skin of right lower limb, including hip: Secondary | ICD-10-CM | POA: Diagnosis not present

## 2023-09-26 DIAGNOSIS — M21621 Bunionette of right foot: Secondary | ICD-10-CM | POA: Diagnosis not present

## 2023-09-26 DIAGNOSIS — M21611 Bunion of right foot: Secondary | ICD-10-CM | POA: Diagnosis not present

## 2023-09-26 DIAGNOSIS — L84 Corns and callosities: Secondary | ICD-10-CM | POA: Diagnosis not present

## 2023-09-26 MED ORDER — IBUPROFEN 600 MG PO TABS: 600.0000 mg | ORAL_TABLET | Freq: Four times a day (QID) | ORAL | 0 refills | Status: AC | PRN

## 2023-09-26 MED ORDER — OXYCODONE HCL 5 MG PO TABS
5.0000 mg | ORAL_TABLET | Freq: Four times a day (QID) | ORAL | 0 refills | Status: AC | PRN
Start: 2023-09-26 — End: 2023-10-03

## 2023-09-26 MED ORDER — ACETAMINOPHEN 500 MG PO TABS: 1000.0000 mg | ORAL_TABLET | Freq: Four times a day (QID) | ORAL | 0 refills | Status: AC | PRN

## 2023-09-26 NOTE — Progress Notes (Signed)
09/26/23 Tailor's bunionectomy

## 2023-10-02 ENCOUNTER — Ambulatory Visit (INDEPENDENT_AMBULATORY_CARE_PROVIDER_SITE_OTHER): Payer: BC Managed Care – PPO

## 2023-10-02 ENCOUNTER — Ambulatory Visit (INDEPENDENT_AMBULATORY_CARE_PROVIDER_SITE_OTHER): Payer: BC Managed Care – PPO | Admitting: Podiatry

## 2023-10-02 DIAGNOSIS — M21621 Bunionette of right foot: Secondary | ICD-10-CM

## 2023-10-02 NOTE — Progress Notes (Signed)
Subjective:  Patient ID: Russell Peters, male    DOB: 12-07-1966,  MRN: [id number]  Chief Complaint  Patient presents with   Routine Post Op    POV # 1 DOS 09/26/23 --- Russell Peters CORRECTION OF RIGHT FOOT    DOS: 09/26/23 Procedure: R foot Tailor's bunionectomy  57 y.o. male returns for post-op check. Overall doing well   Review of Systems: Negative except as noted in the HPI. Denies N/V/F/Ch.   Objective:  There were no vitals filed for this visit. There is no height or weight on file to calculate BMI. Constitutional Well developed. Well nourished.  Vascular Foot warm and well perfused. Capillary refill normal to all digits.  Calf is soft and supple, no posterior calf or knee pain, negative Homans' sign  Neurologic Normal speech. Oriented to person, place, and time. Epicritic sensation to light touch grossly present bilaterally.  Dermatologic Skin healing well without signs of infection. Skin edges well coapted without signs of infection.  Orthopedic: Tenderness to palpation noted about the surgical site. Moderate edema   Multiple view plain film radiographs: good correction noted with percutaneous wire fixation Assessment:   1. Tailor's bunion of right foot    Plan:  Patient was evaluated and treated and all questions answered.  S/p foot surgery right -Progressing as expected post-operatively. -XR: noted above no complication -WB Status: WBAT in CAM boot, can use knee scooter as much as possible -Sutures: remove in 2 weeks. -Medications: no refills required -Foot redressed.  Return in about 2 weeks (around 10/16/2023) for suture & pin removal, post op (no x-rays).

## 2023-10-03 ENCOUNTER — Encounter: Payer: Self-pay | Admitting: Podiatry

## 2023-10-15 ENCOUNTER — Other Ambulatory Visit: Payer: Self-pay | Admitting: Family Medicine

## 2023-10-15 DIAGNOSIS — E1169 Type 2 diabetes mellitus with other specified complication: Secondary | ICD-10-CM

## 2023-10-16 ENCOUNTER — Ambulatory Visit: Payer: BC Managed Care – PPO | Admitting: Podiatry

## 2023-10-16 ENCOUNTER — Encounter: Payer: BC Managed Care – PPO | Admitting: Podiatry

## 2023-10-16 ENCOUNTER — Encounter: Payer: Self-pay | Admitting: Podiatry

## 2023-10-16 DIAGNOSIS — M21621 Bunionette of right foot: Secondary | ICD-10-CM

## 2023-10-16 NOTE — Progress Notes (Signed)
Subjective:  Patient ID: Russell Peters, male    DOB: May 08, 1966,  MRN: [id number]  Chief Complaint  Patient presents with   Routine Post Op    POV # 2 DOS 09/26/23 --- Russell Peters CORRECTION OF RIGHT FOOT   "Its been okay. I only take a Tylenol at night"    DOS: 09/26/23 Procedure: R foot Tailor's bunionectomy  57 y.o. male returns for post-op check. Overall doing well   Review of Systems: Negative except as noted in the HPI. Denies N/V/F/Ch.   Objective:  There were no vitals filed for this visit. There is no height or weight on file to calculate BMI. Constitutional Well developed. Well nourished.  Vascular Foot warm and well perfused. Capillary refill normal to all digits.  Calf is soft and supple, no posterior calf or knee pain, negative Homans' sign  Neurologic Normal speech. Oriented to person, place, and time. Epicritic sensation to light touch grossly present bilaterally.  Dermatologic His incisions are well-healed  Orthopedic: Minimal pain to palpation   Multiple view plain film radiographs: good correction noted with percutaneous wire fixation Assessment:   1. Tailor's bunion of right foot    Plan:  Patient was evaluated and treated and all questions answered.  S/p foot surgery right -Sutures and Kirschner wire removed uneventfully.  May resume bathing.  Should continue to weight-bear only in the cam boot.  Extended periods of time in the foot I would not recommend so I do not think that he should return to full activity at work yet.  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  Otherwise I will see him back in 3 weeks and we will transition likely to a surgical shoe after new x-rays  No follow-ups on file.

## 2023-10-25 ENCOUNTER — Other Ambulatory Visit: Payer: Self-pay | Admitting: Family Medicine

## 2023-11-06 ENCOUNTER — Ambulatory Visit (INDEPENDENT_AMBULATORY_CARE_PROVIDER_SITE_OTHER): Payer: BC Managed Care – PPO | Admitting: Podiatry

## 2023-11-06 ENCOUNTER — Encounter: Payer: Self-pay | Admitting: Podiatry

## 2023-11-06 ENCOUNTER — Ambulatory Visit (INDEPENDENT_AMBULATORY_CARE_PROVIDER_SITE_OTHER): Payer: BC Managed Care – PPO

## 2023-11-06 ENCOUNTER — Encounter: Payer: BC Managed Care – PPO | Admitting: Podiatry

## 2023-11-06 DIAGNOSIS — Z9889 Other specified postprocedural states: Secondary | ICD-10-CM

## 2023-11-06 DIAGNOSIS — M21621 Bunionette of right foot: Secondary | ICD-10-CM

## 2023-11-09 NOTE — Progress Notes (Signed)
  Subjective:  Patient ID: Russell Peters, male    DOB: 1966/12/09,  MRN: [id number]  Chief Complaint  Patient presents with   Routine Post Op    POV #2 right foot patient states he is doing well incision site looks well.    DOS: 09/26/23 Procedure: R foot Tailor's bunionectomy  57 y.o. male returns for post-op check. Overall doing well   Review of Systems: Negative except as noted in the HPI. Denies N/V/F/Ch.   Objective:  There were no vitals filed for this visit. There is no height or weight on file to calculate BMI. Constitutional Well developed. Well nourished.  Vascular Foot warm and well perfused. Capillary refill normal to all digits.  Calf is soft and supple, no posterior calf or knee pain, negative Homans' sign  Neurologic Normal speech. Oriented to person, place, and time. Epicritic sensation to light touch grossly present bilaterally.  Dermatologic His incisions are well-healed  Orthopedic: Minimal pain to palpation   Multiple view plain film radiographs: Correction is maintained there is good early bone callus formation Assessment:   1. Status post surgery   2. Tailor's bunion of right foot    Plan:  Patient was evaluated and treated and all questions answered.  S/p foot surgery right -Doing well showing good stability and early bone callus formation he may gradually transition back to a stiff supportive shoe such as a running shoe or sneaker like his new balance that he has today.  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  I will see him back in 5 weeks for new radiographs  Return in about 5 weeks (around 12/11/2023) for post op (new x-rays).

## 2023-11-10 DIAGNOSIS — H2513 Age-related nuclear cataract, bilateral: Secondary | ICD-10-CM | POA: Diagnosis not present

## 2023-11-10 DIAGNOSIS — R7309 Other abnormal glucose: Secondary | ICD-10-CM | POA: Diagnosis not present

## 2023-11-10 DIAGNOSIS — H40013 Open angle with borderline findings, low risk, bilateral: Secondary | ICD-10-CM | POA: Diagnosis not present

## 2023-11-11 ENCOUNTER — Encounter: Payer: Self-pay | Admitting: Family Medicine

## 2023-11-24 ENCOUNTER — Other Ambulatory Visit: Payer: Self-pay | Admitting: Family Medicine

## 2023-11-24 DIAGNOSIS — I152 Hypertension secondary to endocrine disorders: Secondary | ICD-10-CM

## 2023-11-26 DIAGNOSIS — G4733 Obstructive sleep apnea (adult) (pediatric): Secondary | ICD-10-CM | POA: Diagnosis not present

## 2023-12-11 ENCOUNTER — Ambulatory Visit: Payer: BC Managed Care – PPO | Admitting: Podiatry

## 2023-12-11 ENCOUNTER — Encounter: Payer: Self-pay | Admitting: Podiatry

## 2023-12-11 ENCOUNTER — Ambulatory Visit (INDEPENDENT_AMBULATORY_CARE_PROVIDER_SITE_OTHER): Payer: BC Managed Care – PPO

## 2023-12-11 DIAGNOSIS — M9689 Other intraoperative and postprocedural complications and disorders of the musculoskeletal system: Secondary | ICD-10-CM

## 2023-12-11 DIAGNOSIS — Z9889 Other specified postprocedural states: Secondary | ICD-10-CM | POA: Diagnosis not present

## 2023-12-11 DIAGNOSIS — E559 Vitamin D deficiency, unspecified: Secondary | ICD-10-CM

## 2023-12-14 NOTE — Progress Notes (Signed)
  Subjective:  Patient ID: Russell Peters, male    DOB: [birth year],  MRN: [id number]  Chief Complaint  Patient presents with   Routine Post Op    Tailor's bunion of right foot    DOS: 09/26/23 Procedure: R foot Tailor's bunionectomy  57 y.o. male returns for post-op check. Overall doing well   Review of Systems: Negative except as noted in the HPI. Denies N/V/F/Ch.   Objective:  There were no vitals filed for this visit. There is no height or weight on file to calculate BMI. Constitutional Well developed. Well nourished.  Vascular Foot warm and well perfused. Capillary refill normal to all digits.  Calf is soft and supple, no posterior calf or knee pain, negative Homans' sign  Neurologic Normal speech. Oriented to person, place, and time. Epicritic sensation to light touch grossly present bilaterally.  Dermatologic His incisions are well-healed  Orthopedic: He has no pain to palpation.  Good stability at osteotomy site   Multiple view plain film radiographs: Correction is maintained, does not show any increase in consolidation across osteotomy site currently Assessment:   1. Status post surgery   2. Vitamin D deficiency   3. 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  S/p foot surgery right -Doing okay he does not have any appreciable increase in callus formation around the osteotomy currently.  I recommended evaluating lab work and metabolic panel vitamin D and sed rate were ordered.  He has been in regular shoes without pain or swelling.  Continue this as tolerated.  I will see him back in 4 months to reevaluate or sooner if there are issues with his foot or his lab work  Return in about 4 months (around 04/10/2024) for surgery followup w/ new xrays.

## 2023-12-18 ENCOUNTER — Other Ambulatory Visit: Payer: Self-pay | Admitting: Family Medicine

## 2023-12-18 DIAGNOSIS — E1169 Type 2 diabetes mellitus with other specified complication: Secondary | ICD-10-CM

## 2023-12-27 DIAGNOSIS — G4733 Obstructive sleep apnea (adult) (pediatric): Secondary | ICD-10-CM | POA: Diagnosis not present

## 2023-12-29 DIAGNOSIS — E559 Vitamin D deficiency, unspecified: Secondary | ICD-10-CM | POA: Diagnosis not present

## 2023-12-29 DIAGNOSIS — M9689 Other intraoperative and postprocedural complications and disorders of the musculoskeletal system: Secondary | ICD-10-CM | POA: Diagnosis not present

## 2023-12-30 ENCOUNTER — Other Ambulatory Visit: Payer: Self-pay | Admitting: Family Medicine

## 2023-12-30 LAB — COMPREHENSIVE METABOLIC PANEL
ALT: 29 [IU]/L (ref 0–44)
AST: 21 [IU]/L (ref 0–40)
Albumin: 4.5 g/dL (ref 3.8–4.9)
Alkaline Phosphatase: 68 [IU]/L (ref 44–121)
BUN/Creatinine Ratio: 11 (ref 9–20)
BUN: 13 mg/dL (ref 6–24)
Bilirubin Total: 1 mg/dL (ref 0.0–1.2)
CO2: 25 mmol/L (ref 20–29)
Calcium: 9.7 mg/dL (ref 8.7–10.2)
Chloride: 104 mmol/L (ref 96–106)
Creatinine, Ser: 1.17 mg/dL (ref 0.76–1.27)
Globulin, Total: 1.9 g/dL (ref 1.5–4.5)
Glucose: 105 mg/dL — ABNORMAL HIGH (ref 70–99)
Potassium: 4.3 mmol/L (ref 3.5–5.2)
Sodium: 140 mmol/L (ref 134–144)
Total Protein: 6.4 g/dL (ref 6.0–8.5)
eGFR: 73 mL/min/{1.73_m2} (ref >59)

## 2023-12-30 LAB — VITAMIN D 25 HYDROXY (VIT D DEFICIENCY, FRACTURES): Vit D, 25-Hydroxy: 18.5 ng/mL — ABNORMAL LOW (ref 30.0–100.0)

## 2023-12-30 LAB — SEDIMENTATION RATE: Sed Rate: 2 mm/h (ref 0–30)

## 2023-12-30 MED ORDER — VITAMIN D (ERGOCALCIFEROL) 1.25 MG (50000 UNIT) PO CAPS: 50000.0000 [IU] | ORAL_CAPSULE | ORAL | 0 refills | Status: DC

## 2024-01-19 DIAGNOSIS — L821 Other seborrheic keratosis: Secondary | ICD-10-CM | POA: Diagnosis not present

## 2024-01-19 DIAGNOSIS — L814 Other melanin hyperpigmentation: Secondary | ICD-10-CM | POA: Diagnosis not present

## 2024-01-19 DIAGNOSIS — D225 Melanocytic nevi of trunk: Secondary | ICD-10-CM | POA: Diagnosis not present

## 2024-01-19 DIAGNOSIS — L02821 Furuncle of head [any part, except face]: Secondary | ICD-10-CM | POA: Diagnosis not present

## 2024-01-27 DIAGNOSIS — G4733 Obstructive sleep apnea (adult) (pediatric): Secondary | ICD-10-CM | POA: Diagnosis not present

## 2024-02-10 ENCOUNTER — Other Ambulatory Visit: Payer: Self-pay | Admitting: Family Medicine

## 2024-02-17 ENCOUNTER — Encounter: Payer: Self-pay | Admitting: Internal Medicine

## 2024-02-18 ENCOUNTER — Other Ambulatory Visit: Payer: Self-pay | Admitting: Family Medicine

## 2024-02-18 DIAGNOSIS — E1169 Type 2 diabetes mellitus with other specified complication: Secondary | ICD-10-CM

## 2024-02-24 ENCOUNTER — Other Ambulatory Visit: Payer: Self-pay | Admitting: Family Medicine

## 2024-02-24 DIAGNOSIS — E1159 Type 2 diabetes mellitus with other circulatory complications: Secondary | ICD-10-CM

## 2024-02-24 DIAGNOSIS — G4733 Obstructive sleep apnea (adult) (pediatric): Secondary | ICD-10-CM | POA: Diagnosis not present

## 2024-02-26 ENCOUNTER — Ambulatory Visit: Payer: BC Managed Care – PPO | Admitting: Family Medicine

## 2024-02-26 ENCOUNTER — Encounter: Payer: Self-pay | Admitting: Family Medicine

## 2024-02-26 VITALS — BP 124/80 | HR 91 | Ht 69.5 in | Wt 238.4 lb

## 2024-02-26 DIAGNOSIS — K219 Gastro-esophageal reflux disease without esophagitis: Secondary | ICD-10-CM | POA: Diagnosis not present

## 2024-02-26 DIAGNOSIS — E039 Hypothyroidism, unspecified: Secondary | ICD-10-CM

## 2024-02-26 DIAGNOSIS — E559 Vitamin D deficiency, unspecified: Secondary | ICD-10-CM | POA: Insufficient documentation

## 2024-02-26 DIAGNOSIS — E785 Hyperlipidemia, unspecified: Secondary | ICD-10-CM | POA: Diagnosis not present

## 2024-02-26 DIAGNOSIS — E1169 Type 2 diabetes mellitus with other specified complication: Secondary | ICD-10-CM

## 2024-02-26 DIAGNOSIS — Z860101 Personal history of adenomatous and serrated colon polyps: Secondary | ICD-10-CM

## 2024-02-26 DIAGNOSIS — E1159 Type 2 diabetes mellitus with other circulatory complications: Secondary | ICD-10-CM

## 2024-02-26 DIAGNOSIS — E1165 Type 2 diabetes mellitus with hyperglycemia: Secondary | ICD-10-CM

## 2024-02-26 DIAGNOSIS — G4733 Obstructive sleep apnea (adult) (pediatric): Secondary | ICD-10-CM

## 2024-02-26 DIAGNOSIS — Z0000 Encounter for general adult medical examination without abnormal findings: Secondary | ICD-10-CM

## 2024-02-26 DIAGNOSIS — N522 Drug-induced erectile dysfunction: Secondary | ICD-10-CM

## 2024-02-26 DIAGNOSIS — F325 Major depressive disorder, single episode, in full remission: Secondary | ICD-10-CM | POA: Diagnosis not present

## 2024-02-26 DIAGNOSIS — E1121 Type 2 diabetes mellitus with diabetic nephropathy: Secondary | ICD-10-CM | POA: Insufficient documentation

## 2024-02-26 DIAGNOSIS — F900 Attention-deficit hyperactivity disorder, predominantly inattentive type: Secondary | ICD-10-CM

## 2024-02-26 DIAGNOSIS — E66812 Obesity, class 2: Secondary | ICD-10-CM

## 2024-02-26 DIAGNOSIS — I152 Hypertension secondary to endocrine disorders: Secondary | ICD-10-CM

## 2024-02-26 LAB — LIPID PANEL
Chol/HDL Ratio: 2.5 ratio (ref 0.0–5.0)
Cholesterol, Total: 140 mg/dL (ref 100–199)
HDL: 57 mg/dL (ref >39)
LDL Chol Calc (NIH): 68 mg/dL (ref 0–99)
Triglycerides: 77 mg/dL (ref 0–149)
VLDL Cholesterol Cal: 15 mg/dL (ref 5–40)

## 2024-02-26 LAB — CBC WITH DIFFERENTIAL/PLATELET
Basophils Absolute: 0 10*3/uL (ref 0.0–0.2)
Basos: 1 %
EOS (ABSOLUTE): 0.2 10*3/uL (ref 0.0–0.4)
Eos: 2 %
Hematocrit: 51.3 % — ABNORMAL HIGH (ref 37.5–51.0)
Hemoglobin: 17.7 g/dL (ref 13.0–17.7)
Immature Grans (Abs): 0 10*3/uL (ref 0.0–0.1)
Immature Granulocytes: 0 %
Lymphocytes Absolute: 2.2 10*3/uL (ref 0.7–3.1)
Lymphs: 29 %
MCH: 31.6 pg (ref 26.6–33.0)
MCHC: 34.5 g/dL (ref 31.5–35.7)
MCV: 92 fL (ref 79–97)
Monocytes Absolute: 0.8 10*3/uL (ref 0.1–0.9)
Monocytes: 10 %
Neutrophils Absolute: 4.5 10*3/uL (ref 1.4–7.0)
Neutrophils: 58 %
Platelets: 317 10*3/uL (ref 150–450)
RBC: 5.6 x10E6/uL (ref 4.14–5.80)
RDW: 11.9 % (ref 11.6–15.4)
WBC: 7.7 10*3/uL (ref 3.4–10.8)

## 2024-02-26 LAB — POCT GLYCOSYLATED HEMOGLOBIN (HGB A1C): Hemoglobin A1C: 5.6 % (ref 4.0–5.6)

## 2024-02-26 LAB — POCT UA - MICROALBUMIN
Albumin/Creatinine Ratio, Urine, POC: 100.3
Creatinine, POC: 299 mg/dL
Microalbumin Ur, POC: 300 mg/L

## 2024-02-26 LAB — TSH: TSH: 2.86 u[IU]/mL (ref 0.450–4.500)

## 2024-02-26 MED ORDER — LEVOTHYROXINE SODIUM 88 MCG PO TABS: 88.0000 ug | ORAL_TABLET | Freq: Every day | ORAL | 1 refills | Status: DC

## 2024-02-26 MED ORDER — ROSUVASTATIN CALCIUM 40 MG PO TABS: 40.0000 mg | ORAL_TABLET | Freq: Every day | ORAL | 2 refills | Status: DC

## 2024-02-26 MED ORDER — TIRZEPATIDE 2.5 MG/0.5ML ~~LOC~~ SOAJ: 2.5000 mg | SUBCUTANEOUS | Status: DC

## 2024-02-26 MED ORDER — AMPHETAMINE-DEXTROAMPHETAMINE 30 MG PO TABS: 1.0000 | ORAL_TABLET | Freq: Two times a day (BID) | ORAL | 0 refills | Status: DC

## 2024-02-26 MED ORDER — LISINOPRIL-HYDROCHLOROTHIAZIDE 20-12.5 MG PO TABS: 1.0000 | ORAL_TABLET | Freq: Every day | ORAL | 2 refills | Status: DC

## 2024-02-26 NOTE — Progress Notes (Addendum)
 Complete physical exam  Patient: Russell Peters   DOB: Jan 29, 1966   58 y.o. Male  MRN: [id number]  Subjective:    Chief Complaint  Patient presents with   Annual Exam    CPE, fasting labs, discuss Ozempic, causing nausea feels bloated and full,     Russell Peters is a 58 y.o. male who presents today for a complete physical exam. He reports consuming a general diet. Home exercise routine includes walking 0.5 hrs per day. He generally feels fairly well. He reports sleeping well.  He is using his CPAP and getting good results with that.  He did have trouble with Ozempic causing nausea and bloating.  He continues on his ADD med and is getting roughly 5 hours of benefit out of it.  He sometimes uses a second dose.  He is set up to follow up on colonic polyp.  He also had a hemorrhoidectomy.  His reflux seems to be under good control using something on a daily basis.  Continues on his thyroid medication.  Continues on lisinopril/HCTZ as well as Crestor.  He was recently evaluated for vitamin D deficiency and indeed is slightly low.  He is now taking multivitamin with extra vitamin D.  He does have additional problems to discuss today.  Family and social history as well as health maintenance and immunizations was reviewed.   Most recent fall risk assessment:    02/26/2024    8:27 AM  Fall Risk   Falls in the past year? 0  Number falls in past yr: 0  Injury with Fall? 0  Risk for fall due to : No Fall Risks  Follow up Falls evaluation completed     Most recent depression screenings:    02/26/2024    8:27 AM 07/10/2023    9:09 AM  PHQ 2/9 Scores  PHQ - 2 Score 0 0  PHQ- 9 Score  2        Patient Care Team: Ronnald Nian, MD as PCP - General (Family Medicine)   Outpatient Medications Prior to Visit  Medication Sig   Accu-Chek Softclix Lancets lancets 1 each by Other route daily. Use as instructed to check blood sugar   clobetasol (TEMOVATE) 0.05 % external solution Apply 1  Application topically as needed.   fluocinolone (SYNALAR) 0.01 % external solution    glucose blood test strip 1 each by Other route as needed for other. Use as instructed to check blood sugar   omeprazole (PRILOSEC) 20 MG capsule Take 1 capsule (20 mg total) by mouth daily.   OZEMPIC, 1 MG/DOSE, 4 MG/3ML SOPN INJECT 1 MG AS DIRECTED ONCE A WEEK.   Vitamin D, Ergocalciferol, (DRISDOL) 1.25 MG (50000 UNIT) CAPS capsule Take 1 capsule (50,000 Units total) by mouth every 7 (seven) days.   [DISCONTINUED] amphetamine-dextroamphetamine (ADDERALL) 30 MG tablet TAKE 1 TABLET BY MOUTH 2 TIMES DAILY.   [DISCONTINUED] levothyroxine (SYNTHROID) 88 MCG tablet TAKE 1 TABLET (88 MCG TOTAL) BY MOUTH DAILY.   [DISCONTINUED] lisinopril-hydrochlorothiazide (ZESTORETIC) 20-12.5 MG tablet TAKE 1 TABLET BY MOUTH DAILY.   [DISCONTINUED] rosuvastatin (CRESTOR) 40 MG tablet TAKE 1 TABLET (40 MG TOTAL) BY MOUTH DAILY.   No facility-administered medications prior to visit.    Review of Systems  All other systems reviewed and are negative.         Objective:       Physical Exam  Alert and in no distress. Tympanic membranes and canals are normal. Pharyngeal area is normal.  Neck is supple without adenopathy or thyromegaly. Cardiac exam shows a regular sinus rhythm without murmurs or gallops. Lungs are clear to auscultation. Hemoglobin A1c is 5.6     Assessment & Plan:    Routine general medical examination at a health care facility  Attention deficit hyperactivity disorder (ADHD), predominantly inattentive type - Plan: amphetamine-dextroamphetamine (ADDERALL) 30 MG tablet  Depression, major, in remission (HCC)  Drug-induced erectile dysfunction  Gastroesophageal reflux disease without esophagitis - Plan: CBC with Differential/Platelet  Hx of adenomatous colonic polyps  Hyperlipidemia associated with type 2 diabetes mellitus (HCC) - Plan: Lipid panel, rosuvastatin (CRESTOR) 40 MG tablet  Type 2  diabetes mellitus with hyperglycemia, without long-term current use of insulin (HCC) - Plan: POCT glycosylated hemoglobin (Hb A1C), POCT UA - Microalbumin, tirzepatide (MOUNJARO) 2.5 MG/0.5ML Pen  Obesity, Class II, BMI 35-39.9  OSA (obstructive sleep apnea)  Hypothyroidism, unspecified type - Plan: levothyroxine (SYNTHROID) 88 MCG tablet, TSH  Hypertension associated with type 2 diabetes mellitus (HCC) - Plan: lisinopril-hydrochlorothiazide (ZESTORETIC) 20-12.5 MG tablet  Diabetic nephropathy associated with type 2 diabetes mellitus (HCC)  Vitamin D deficiency  Immunization History  Administered Date(s) Administered   Influenza,inj,Quad PF,6+ Mos 10/16/2021, 09/05/2022   Influenza-Unspecified 02/25/2018, 10/18/2019   Moderna Covid-19 Vaccine Bivalent Booster 65yrs & up 10/16/2021   Moderna Sars-Covid-2 Vaccination 07/07/2020, 08/04/2020, 01/11/2021   PNEUMOCOCCAL CONJUGATE-20 06/26/2021   Pfizer(Comirnaty)Fall Seasonal Vaccine 12 years and older 02/05/2023   Tdap 02/04/2017   Zoster Recombinant(Shingrix) 09/05/2022, 02/05/2023    Health Maintenance  Topic Date Due   HIV Screening  Never done   FOOT EXAM  06/26/2022   INFLUENZA VACCINE  07/24/2023   COVID-19 Vaccine (6 - 2024-25 season) 08/24/2023   OPHTHALMOLOGY EXAM  11/07/2023   HEMOGLOBIN A1C  08/28/2024   Diabetic kidney evaluation - eGFR measurement  12/28/2024   Diabetic kidney evaluation - Urine ACR  02/25/2025   Colonoscopy  05/21/2026   DTaP/Tdap/Td (2 - Td or Tdap) 02/04/2027   Pneumococcal Vaccine 71-72 Years old  Completed   Hepatitis C Screening  Completed   Zoster Vaccines- Shingrix  Completed   HPV VACCINES  Aged Out   Fecal DNA (Cologuard)  Discontinued     Problem List Items Addressed This Visit     Attention deficit disorder   Relevant Medications   amphetamine-dextroamphetamine (ADDERALL) 30 MG tablet (Start on 03/09/2024)   Depression, major, in remission (HCC)   Diabetic nephropathy associated  with type 2 diabetes mellitus (HCC)   Relevant Medications   rosuvastatin (CRESTOR) 40 MG tablet   lisinopril-hydrochlorothiazide (ZESTORETIC) 20-12.5 MG tablet   tirzepatide (MOUNJARO) 2.5 MG/0.5ML Pen   Drug-induced erectile dysfunction   Gastroesophageal reflux disease   Relevant Orders   CBC with Differential/Platelet   Hx of adenomatous colonic polyps   Hyperlipidemia associated with type 2 diabetes mellitus (HCC)   Relevant Medications   rosuvastatin (CRESTOR) 40 MG tablet   lisinopril-hydrochlorothiazide (ZESTORETIC) 20-12.5 MG tablet   tirzepatide (MOUNJARO) 2.5 MG/0.5ML Pen   Other Relevant Orders   Lipid panel   Hypertension associated with type 2 diabetes mellitus (HCC)   Relevant Medications   rosuvastatin (CRESTOR) 40 MG tablet   lisinopril-hydrochlorothiazide (ZESTORETIC) 20-12.5 MG tablet   tirzepatide (MOUNJARO) 2.5 MG/0.5ML Pen   Hypothyroid   Relevant Medications   levothyroxine (SYNTHROID) 88 MCG tablet   Other Relevant Orders   TSH   Obesity, Class II, BMI 35-39.9   OSA (obstructive sleep apnea)   Type 2 diabetes mellitus with hyperglycemia (HCC)  Relevant Medications   rosuvastatin (CRESTOR) 40 MG tablet   lisinopril-hydrochlorothiazide (ZESTORETIC) 20-12.5 MG tablet   tirzepatide (MOUNJARO) 2.5 MG/0.5ML Pen   Other Relevant Orders   POCT glycosylated hemoglobin (Hb A1C) (Completed)   POCT UA - Microalbumin (Completed)   Vitamin D deficiency   Other Visit Diagnoses       Routine general medical examination at a health care facility    -  Primary     I will switch him to [facility]  Samples was given.  He will keep me informed as to whether he can tolerate this medication.  He is to leave me a message on MyChart concerning this.  If not I will place him back on Jardiance which did work in the past for his diabetes.  Continue on his other medications.  I did discuss the fact that he now has evidence of nephropathy. Return in about 4 months (around  06/27/2024).     Sharlot Gowda, MD

## 2024-02-26 NOTE — Patient Instructions (Signed)
 Send me a message on MyChart on how you are doing on the [facility].  We will set you up to be seen back here in 4 months

## 2024-02-27 ENCOUNTER — Encounter: Payer: Self-pay | Admitting: Family Medicine

## 2024-03-23 ENCOUNTER — Encounter: Payer: Self-pay | Admitting: Family Medicine

## 2024-03-23 DIAGNOSIS — E1165 Type 2 diabetes mellitus with hyperglycemia: Secondary | ICD-10-CM

## 2024-03-23 MED ORDER — TIRZEPATIDE 2.5 MG/0.5ML ~~LOC~~ SOAJ: 2.5000 mg | SUBCUTANEOUS | 1 refills | Status: DC

## 2024-03-24 ENCOUNTER — Other Ambulatory Visit (HOSPITAL_COMMUNITY): Payer: Self-pay

## 2024-03-24 ENCOUNTER — Telehealth: Payer: Self-pay

## 2024-03-24 NOTE — Telephone Encounter (Signed)
 Pharmacy Patient Advocate Encounter   Received notification from CoverMyMeds that prior authorization for [facility] 2.5MG /0.5ML auto-injectors is required/requested.   Insurance verification completed.   The patient is insured through [facility] .   Per test claim: PA required; PA submitted to above mentioned insurance via CoverMyMeds Key/confirmation #/EOC (Key: [id number])   Status is pending

## 2024-03-25 ENCOUNTER — Other Ambulatory Visit (HOSPITAL_COMMUNITY): Payer: Self-pay

## 2024-03-25 NOTE — Telephone Encounter (Signed)
 Pharmacy Patient Advocate Encounter  Received notification from [facility] that Prior Authorization for [facility] 2.5MG /0.5ML auto-injectors has been APPROVED through 4.2.26. Ran test claim, Copay is $35.00. This test claim was processed through [facility]- copay amounts may vary at other pharmacies due to pharmacy/plan contracts, or as the patient moves through the different stages of their insurance plan.   PA #/Case ID/Reference #: (Key: [id number])

## 2024-04-07 ENCOUNTER — Encounter: Payer: Self-pay | Admitting: Family Medicine

## 2024-04-12 ENCOUNTER — Ambulatory Visit (INDEPENDENT_AMBULATORY_CARE_PROVIDER_SITE_OTHER)

## 2024-04-12 ENCOUNTER — Ambulatory Visit: Payer: BC Managed Care – PPO | Admitting: Podiatry

## 2024-04-12 ENCOUNTER — Encounter: Payer: Self-pay | Admitting: Podiatry

## 2024-04-12 DIAGNOSIS — Z9889 Other specified postprocedural states: Secondary | ICD-10-CM

## 2024-04-12 DIAGNOSIS — M9689 Other intraoperative and postprocedural complications and disorders of the musculoskeletal system: Secondary | ICD-10-CM | POA: Diagnosis not present

## 2024-04-12 DIAGNOSIS — M21621 Bunionette of right foot: Secondary | ICD-10-CM | POA: Diagnosis not present

## 2024-04-13 NOTE — Progress Notes (Signed)
  Subjective:  Patient ID: Russell Peters, male    DOB: [birth year],  MRN: [id number]  Chief Complaint  Patient presents with   Routine Post Op    DOS: 09/26/23 Procedure: R foot Tailor's bunionectomy "Its been feeling good, not having any pain"    DOS: 09/26/23 Procedure: R foot Tailor's bunionectomy  58 y.o. male returns for post-op check.  Pleased with his outcome  Review of Systems: Negative except as noted in the HPI. Denies N/V/F/Ch.   Objective:  There were no vitals filed for this visit. There is no height or weight on file to calculate BMI. Constitutional Well developed. Well nourished.  Vascular Foot warm and well perfused. Capillary refill normal to all digits.  Calf is soft and supple, no posterior calf or knee pain, negative Homans' sign  Neurologic Normal speech. Oriented to person, place, and time. Epicritic sensation to light touch grossly present bilaterally.  Dermatologic His incisions are well-healed  Orthopedic: He has no pain to palpation.  Good stability at osteotomy site.  No edema.  No recurrence of tailor's bunion deformity   Multiple view plain film radiographs: Correction is maintained, does not show any increase in consolidation across osteotomy site currently and site is still visible without loss of correction Assessment:   1. Tailor's bunion of right foot   2. Nonunion of osteotomy site    Plan:  Patient was evaluated and treated and all questions answered.  S/p foot surgery right - He has been taking the vitamin D  supplement but I do not appreciate any increased consolidation across the osteotomy site today.  He has had no change in alignment loss of correction and is asymptomatic at his nonunion site.  At this point would not recommend surgical revision as he is doing quite well full activity in regular shoes.  I have no restrictions for him.  Return as needed if he develops symptoms or other issues.  No follow-ups on file.

## 2024-04-28 ENCOUNTER — Encounter: Payer: Self-pay | Admitting: Family Medicine

## 2024-04-29 ENCOUNTER — Telehealth: Payer: Self-pay | Admitting: Family Medicine

## 2024-04-29 MED ORDER — TIRZEPATIDE 5 MG/0.5ML ~~LOC~~ SOAJ: 5.0000 mg | SUBCUTANEOUS | 0 refills | Status: DC

## 2024-04-29 NOTE — Telephone Encounter (Signed)
 Pt's ins will not allow another month of the Mounjaro 2.5 mg, please send in refill of the 5 mg to [facility]

## 2024-05-17 ENCOUNTER — Encounter: Payer: Self-pay | Admitting: Family Medicine

## 2024-05-18 ENCOUNTER — Ambulatory Visit: Admitting: Medical

## 2024-05-18 ENCOUNTER — Ambulatory Visit
Admission: RE | Admit: 2024-05-18 | Discharge: 2024-05-18 | Disposition: A | Discharge status: Home / Self Care | Source: Ambulatory Visit | Attending: Medical | Admitting: Medical

## 2024-05-18 VITALS — BP 118/82 | HR 86 | Temp 97.7°F | Resp 18 | Wt 235.2 lb

## 2024-05-18 DIAGNOSIS — R0609 Other forms of dyspnea: Secondary | ICD-10-CM

## 2024-05-18 DIAGNOSIS — I152 Hypertension secondary to endocrine disorders: Secondary | ICD-10-CM

## 2024-05-18 DIAGNOSIS — R0602 Shortness of breath: Secondary | ICD-10-CM

## 2024-05-18 DIAGNOSIS — E1159 Type 2 diabetes mellitus with other circulatory complications: Secondary | ICD-10-CM | POA: Diagnosis not present

## 2024-05-18 DIAGNOSIS — E039 Hypothyroidism, unspecified: Secondary | ICD-10-CM

## 2024-05-18 DIAGNOSIS — E1165 Type 2 diabetes mellitus with hyperglycemia: Secondary | ICD-10-CM

## 2024-05-18 NOTE — Patient Instructions (Signed)
 Please go to [facility] for your chest xray.   Their hours are 8am - 4:30 pm Monday - Friday.  Take your insurance card with you.  [facility] [phone]   [address] [place], [place] [address]

## 2024-05-18 NOTE — Progress Notes (Signed)
 Subjective:  Russell Peters is a 58 y.o. male who presents for Chief Complaint  Patient presents with   Shortness of Breath    SOB since the weekend. Saturday and Sunday was doing some gardening and couldn't catch his breath. Today- got up and wasn't doing much and couldn't catch breath. Been going on a couple of  weeks     Here for SOB.  Started several weeks ago, but a little worse this weekend.  No pain, no chest pain.  Sometimes feels it with exertion.  Today feels like he can't get enough breath.   Would feel tired even a few hours later.  Feels like when he had anxiety in the past.  There have been a few times feeling dizzy with standing.  Had some nauseated over the weekend 2 days ago, but gets this with the mounjaro.  He walks a few miles daily for exercise on her exercise intensity distance.  2 weeks ago went from 2.5mg  to 5mg  mounjaro.  He did not tolerate Ozempic  in the past due to nausea and bloating.  No recent swelling in legs.  Nonsmoker.  Drinks a few beers daily, most days.  He also wonders if this is related to anxiety as he is 63 year old brother wall is living with them right now and she can be difficult.  Pertinent family history: Dad had MI age 31yo, paternal grandfather and paternal uncle had heart disease.  Grandfather had lung cancer.  No other aggravating or relieving factors.    No other c/o.  Past Medical History:  Diagnosis Date   ADD (attention deficit disorder)    Allergy    Anxiety    Chronic kidney disease    kidney stone years ago   Depression    Diabetes mellitus without complication (HCC)    GERD (gastroesophageal reflux disease)    Hyperlipidemia    Hypertension    Sleep apnea    wears Cpap   Thyroid  disease    Current Outpatient Medications on File Prior to Visit  Medication Sig Dispense Refill   amphetamine -dextroamphetamine  (ADDERALL) 30 MG tablet Take 1 tablet by mouth 2 (two) times daily. 60 tablet 0   clobetasol (TEMOVATE)  0.05 % external solution Apply 1 Application topically as needed.     fluocinolone (SYNALAR) 0.01 % external solution      levothyroxine  (SYNTHROID ) 88 MCG tablet Take 1 tablet (88 mcg total) by mouth daily. 90 tablet 1   lisinopril -hydrochlorothiazide  (ZESTORETIC ) 20-12.5 MG tablet Take 1 tablet by mouth daily. 30 tablet 2   omeprazole  (PRILOSEC) 20 MG capsule Take 1 capsule (20 mg total) by mouth daily.     rosuvastatin  (CRESTOR ) 40 MG tablet Take 1 tablet (40 mg total) by mouth daily. 30 tablet 2   tirzepatide (MOUNJARO) 5 MG/0.5ML Pen Inject 5 mg into the skin once a week. 6 mL 0   Vitamin D , Ergocalciferol , (DRISDOL ) 1.25 MG (50000 UNIT) CAPS capsule Take 1 capsule (50,000 Units total) by mouth every 7 (seven) days. 12 capsule 0   Accu-Chek Softclix Lancets lancets 1 each by Other route daily. Use as instructed to check blood sugar 100 each 3   glucose blood test strip 1 each by Other route as needed for other. Use as instructed to check blood sugar 100 each 3   No current facility-administered medications on file prior to visit.    The following portions of the patient's history were reviewed and updated as appropriate: allergies, current medications, past family history, past  medical history, past social history, past surgical history and problem list.  ROS Otherwise as in subjective above     Objective: BP 118/82   Pulse 86   Temp 97.7 F (36.5 C)   Resp 18   Wt 235 lb 3.2 oz (106.7 kg)   SpO2 95%   BMI 34.24 kg/m   BP Readings from Last 3 Encounters:  05/18/24 118/82  02/26/24 124/80  09/09/23 132/80   Wt Readings from Last 3 Encounters:  05/18/24 235 lb 3.2 oz (106.7 kg)  02/26/24 238 lb 6.4 oz (108.1 kg)  09/09/23 241 lb (109.3 kg)    General appearance: alert, no distress, well developed, well nourished Neck: supple, no lymphadenopathy, no thyromegaly, no masses, no JVD or bruits Heart: RRR, normal S1, S2, no murmurs Lungs: CTA bilaterally, no wheezes,  rhonchi, or rales Abdomen: +bs, soft, non tender, non distended, no masses, no hepatomegaly, no splenomegaly Pulses: 2+ radial pulses, 2+ pedal pulses, normal cap refill Ext: no edema Neuro: cn 2-12 intact, nonfocal exam    Assessment: Encounter Diagnoses  Name Primary?   SOB (shortness of breath) Yes   DOE (dyspnea on exertion)    Hypertension associated with type 2 diabetes mellitus (HCC)    Hypothyroidism, unspecified type    Type 2 diabetes mellitus with hyperglycemia, without long-term current use of insulin (HCC)      Plan: We discussed his recent symptoms and lab differential.  Differential could include cardiac, lung issue, thyroid  issue, deconditioning, medication adverse effect, hypotension or other.  EKG reviewed.  No significant change in EKG.  No significant change on orthostatics.  Labs today and we will send for chest x-ray  We discussed possible referral to cardiology and general for further evaluation   Eliot "[name]" was seen today for shortness of breath.  Diagnoses and all orders for this visit:  SOB (shortness of breath) -     CBC -     Basic metabolic panel with GFR -     Brain natriuretic peptide -     TSH -     EKG 12-Lead -     DG Chest 2 View; Future -     Orthostatic vital signs  DOE (dyspnea on exertion) -     CBC -     Basic metabolic panel with GFR -     Brain natriuretic peptide -     TSH -     EKG 12-Lead -     DG Chest 2 View; Future -     Orthostatic vital signs  Hypertension associated with type 2 diabetes mellitus (HCC)  Hypothyroidism, unspecified type -     TSH  Type 2 diabetes mellitus with hyperglycemia, without long-term current use of insulin (HCC)    Follow up: pending labs, chest xray

## 2024-05-19 ENCOUNTER — Other Ambulatory Visit: Payer: Self-pay | Admitting: Medical

## 2024-05-19 ENCOUNTER — Ambulatory Visit: Payer: Self-pay | Admitting: Medical

## 2024-05-19 DIAGNOSIS — R0609 Other forms of dyspnea: Secondary | ICD-10-CM

## 2024-05-19 DIAGNOSIS — R0602 Shortness of breath: Secondary | ICD-10-CM

## 2024-05-19 DIAGNOSIS — Z8249 Family history of ischemic heart disease and other diseases of the circulatory system: Secondary | ICD-10-CM

## 2024-05-19 LAB — BASIC METABOLIC PANEL WITH GFR
BUN/Creatinine Ratio: 11 (ref 9–20)
BUN: 16 mg/dL (ref 6–24)
CO2: 20 mmol/L (ref 20–29)
Calcium: 10.2 mg/dL (ref 8.7–10.2)
Chloride: 99 mmol/L (ref 96–106)
Creatinine, Ser: 1.44 mg/dL — ABNORMAL HIGH (ref 0.76–1.27)
Glucose: 95 mg/dL (ref 70–99)
Potassium: 5.1 mmol/L (ref 3.5–5.2)
Sodium: 137 mmol/L (ref 134–144)
eGFR: 57 mL/min/{1.73_m2} — ABNORMAL LOW (ref >59)

## 2024-05-19 LAB — CBC
Hematocrit: 50.9 % (ref 37.5–51.0)
Hemoglobin: 17.5 g/dL (ref 13.0–17.7)
MCH: 33 pg (ref 26.6–33.0)
MCHC: 34.4 g/dL (ref 31.5–35.7)
MCV: 96 fL (ref 79–97)
Platelets: 295 10*3/uL (ref 150–450)
RBC: 5.31 x10E6/uL (ref 4.14–5.80)
RDW: 12.3 % (ref 11.6–15.4)
WBC: 8.5 10*3/uL (ref 3.4–10.8)

## 2024-05-19 LAB — BRAIN NATRIURETIC PEPTIDE: BNP: 3.4 pg/mL (ref 0.0–100.0)

## 2024-05-19 LAB — TSH: TSH: 2.64 u[IU]/mL (ref 0.450–4.500)

## 2024-05-19 NOTE — Progress Notes (Signed)
 Results sent through MyChart

## 2024-05-25 ENCOUNTER — Ambulatory Visit: Attending: Cardiology | Admitting: Cardiology

## 2024-05-25 ENCOUNTER — Encounter: Payer: Self-pay | Admitting: Cardiology

## 2024-05-25 VITALS — BP 122/88 | HR 76 | Ht 69.0 in | Wt 239.5 lb

## 2024-05-25 DIAGNOSIS — R072 Precordial pain: Secondary | ICD-10-CM

## 2024-05-25 DIAGNOSIS — R0609 Other forms of dyspnea: Secondary | ICD-10-CM

## 2024-05-25 DIAGNOSIS — E559 Vitamin D deficiency, unspecified: Secondary | ICD-10-CM

## 2024-05-25 DIAGNOSIS — E1159 Type 2 diabetes mellitus with other circulatory complications: Secondary | ICD-10-CM

## 2024-05-25 DIAGNOSIS — G4733 Obstructive sleep apnea (adult) (pediatric): Secondary | ICD-10-CM | POA: Diagnosis not present

## 2024-05-25 DIAGNOSIS — R0602 Shortness of breath: Secondary | ICD-10-CM

## 2024-05-25 DIAGNOSIS — E1169 Type 2 diabetes mellitus with other specified complication: Secondary | ICD-10-CM

## 2024-05-25 DIAGNOSIS — I152 Hypertension secondary to endocrine disorders: Secondary | ICD-10-CM

## 2024-05-25 DIAGNOSIS — E785 Hyperlipidemia, unspecified: Secondary | ICD-10-CM

## 2024-05-25 MED ORDER — METOPROLOL TARTRATE 100 MG PO TABS
100.0000 mg | ORAL_TABLET | Freq: Once | ORAL | 0 refills | Status: DC
Start: 2024-05-25 — End: 2024-08-09

## 2024-05-25 NOTE — Patient Instructions (Addendum)
 Medication Instructions:   No changes *If you need a refill on your cardiac medications before your next appointment, please call your pharmacy*   Lab Work: Not needed    Testing/Procedures: 1) Will be schedule by Radiology schedulers Your physician has requested that you have coronary  CTA. Coronary computed tomography (CT)angiogram  is a special type of CT scan that uses a computer to produce multi-dimensional views of major blood vessels throughout the heart.  CT angiography, a contrast material is injected through an IV to help visualize the blood vessels  a painless test that uses an x-ray machine to take clear, detailed pictures of your heart arteries .  Please follow instruction sheet as given.   2) Your physician has requested that you have an echocardiogram. Echocardiography is a painless test that uses sound waves to create images of your heart. It provides your doctor with information about the size and shape of your heart and how well your heart's chambers and valves are working. This procedure takes approximately one hour. There are no restrictions for this procedure. Please do NOT wear cologne, perfume, aftershave, or lotions (deodorant is allowed). Please arrive 15 minutes prior to your appointment time.  Please note: We ask at that you not bring children with you during ultrasound (echo/ vascular) testing. Due to room size and safety concerns, children are not allowed in the ultrasound rooms during exams. Our front office staff cannot provide observation of children in our lobby area while testing is being conducted. An adult accompanying a patient to their appointment will only be allowed in the ultrasound room at the discretion of the ultrasound technician under special circumstances. We apologize for any inconvenience.  Follow-Up: At [facility], you and your health needs are our priority.  As part of our continuing mission to provide you with exceptional heart care, we  have created designated Provider Care Teams.  These Care Teams include your primary Cardiologist (physician) and Advanced Practice Providers (APPs -  Physician Assistants and Nurse Practitioners) who all work together to provide you with the care you need, when you need it.     Your next appointment:   2 month(s)  The format for your next appointment:   In Person  Provider:   Randene Bustard, MD   Other Instructions     Your cardiac CT will be scheduled at one of the below locations:     [facility] [address]  [place], [place] [address]   If scheduled at the [facility] and [facility] at [address], please enter the parking lot using the [address] entrance and use the FREE valet service at the patient drop-off area. Enter the buidling and check-in with registration on the main floor.   Please follow these instructions carefully (unless otherwise directed):  An IV will be required for this test and Nitroglycerin will be given.  Hold all erectile dysfunction medications at least 3 days (72 hrs) prior to test. (Ie viagra, cialis , sildenafil, tadalafil , etc)   On the Night Before the Test: Be sure to Drink plenty of water. Do not consume any caffeinated/decaffeinated beverages or chocolate 12 hours prior to your test. Do not take any antihistamines 12 hours prior to your test.   On the Day of the Test: Drink plenty of water until 1 hour prior to the test. Do not eat any food 1 hour prior to test. You may take your regular medications prior to the test.  Take metoprolol  TARTRATE  (Lopressor) 100 MG two hours prior to test. If you take Lisinopril  -Hydrochlorothiazide , please HOLD on the morning of the test.        After the Test: Drink plenty of water. After receiving IV contrast, you may experience a mild flushed feeling. This is normal. On occasion, you may experience a mild rash up to 24 hours after the test. This is not  dangerous. If this occurs, you can take Benadryl 25 mg, Zyrtec, Claritin, or Allegra and increase your fluid intake. (Patients taking Tikosyn should avoid Benadryl, and may take Zyrtec, Claritin, or Allegra) If you experience trouble breathing, this can be serious. If it is severe call 911 IMMEDIATELY. If it is mild, please call our office.  We will call to schedule your test 2-4 weeks out understanding that some insurance companies will need an authorization prior to the service being performed.   For more information and frequently asked questions, please visit our website : [email or web address]  For non-scheduling related questions, please contact the cardiac imaging nurse navigator should you have any questions/concerns: Cardiac Imaging Nurse Navigators Direct Office Dial: [phone]   For scheduling needs, including cancellations and rescheduling, please call [place] or [place], [phone].

## 2024-05-25 NOTE — Progress Notes (Unsigned)
 Cardiology Office Note:  .   Date:  05/27/2024  ID:  Russell Peters, DOB 01/01/1966, MRN [id number] PCP: Watson Hacking, MD  [facility] Providers Cardiologist:  Randene Bustard, MD     No chief complaint on file.   Patient Profile: .     Russell Peters is an obese 58 y.o. male with a PMH notable for DM-2, HTN, HLD who presents here for evaluation of exertional dyspnea and chest discomfort at the request of Claudene Crystal, PA-C.      Russell Peters was referred by Nelda Balsam following a visit on 05/18/2024.  Subjective  Discussed the use of AI scribe software for clinical note transcription with the patient, who gave verbal consent to proceed.  History of Present Illness Russell Peters "[name]" is a 58 year old male with hypertension who presents with shortness of breath.  He has been experiencing shortness of breath for approximately two and a half weeks, initially noted while gardening. The sensation is described as being unable to 'get enough air' despite not engaging in strenuous activity, and it persisted for three to four hours while resting that evening. Since then, he has had intermittent episodes, particularly in the mornings while getting ready for work, but he can walk a couple of miles in the evenings without issues. A similar episode occurred about a month ago during a hot day at a graveside service, where he felt winded and tired while walking uphill in a suit.  No chest tightness, pressure, or pain associated with these episodes. No orthopnea or paroxysmal nocturnal dyspnea. He reports mild peripheral edema in the evenings, noticeable when removing his socks, but it is not painful. Occasional dizziness occurs when standing up too quickly.  He is currently taking lisinopril  and ACTs for hypertension and has been on these medications for a long time. He recently started Mounjaro, up to 5 mg.  His family history is significant for heart disease; his father  had a myocardial infarction at 44 and underwent coronary artery bypass grafting and valve repair. His paternal uncle and grandfather also died young from heart-related issues, compounded by lifestyle factors such as heavy drinking and smoking.  He works for a funeral home and engages in regular evening walks with his wife.     Objective   Family History - Father had heart problems - Father died of cancer - Father had a heart attack at 34 - Father had a bypass and valve repair - Uncle died young of heart disease - Grandfather died young of heart disease  Current Meds  Medication Sig   Accu-Chek Softclix Lancets lancets 1 each by Other route daily. Use as instructed to check blood sugar   amphetamine -dextroamphetamine  (ADDERALL) 30 MG tablet Take 1 tablet by mouth 2 (two) times daily.   clobetasol (TEMOVATE) 0.05 % external solution Apply 1 Application topically as needed.   fluocinolone (SYNALAR) 0.01 % external solution    glucose blood test strip 1 each by Other route as needed for other. Use as instructed to check blood sugar   levothyroxine  (SYNTHROID ) 88 MCG tablet Take 1 tablet (88 mcg total) by mouth daily.   lisinopril -hydrochlorothiazide  (ZESTORETIC ) 20-12.5 MG tablet Take 1 tablet by mouth daily.   metoprolol tartrate (LOPRESSOR) 100 MG tablet Take 1 tablet (100 mg total) by mouth once for 1 dose. TAKE TWO HOURS PRIOR TO  SCHEDULE CARDIAC TEST   omeprazole  (PRILOSEC) 20 MG capsule Take 1 capsule (20 mg total) by  mouth daily.   rosuvastatin  (CRESTOR ) 40 MG tablet Take 1 tablet (40 mg total) by mouth daily.   tirzepatide (MOUNJARO) 5 MG/0.5ML Pen Inject 5 mg into the skin once a week.   Vitamin D , Ergocalciferol , (DRISDOL ) 1.25 MG (50000 UNIT) CAPS capsule Take 1 capsule (50,000 Units total) by mouth every 7 (seven) days.    Studies Reviewed: Aaron Aas         Lab Results  Component Value Date   CHOL 140 02/26/2024   HDL 57 02/26/2024   LDLCALC 68 02/26/2024   TRIG 77  02/26/2024   CHOLHDL 2.5 02/26/2024   Lab Results  Component Value Date   NA 137 05/18/2024   K 5.1 05/18/2024   CREATININE 1.44 (H) 05/18/2024   EGFR 57 (L) 05/18/2024   GLUCOSE 95 05/18/2024   Lab Results  Component Value Date   HGBA1C 5.6 02/26/2024  Bnp 3.4  Risk Assessment/Calculations:          Physical Exam:   VS:  BP 122/88 (BP Location: Left Arm, Patient Position: Sitting, Cuff Size: Normal)   Pulse 76   Ht 5\' 9"  (1.753 m)   Wt 239 lb 8 oz (108.6 kg)   SpO2 96%   BMI 35.37 kg/m    Wt Readings from Last 3 Encounters:  05/25/24 239 lb 8 oz (108.6 kg)  05/18/24 235 lb 3.2 oz (106.7 kg)  02/26/24 238 lb 6.4 oz (108.1 kg)    GEN: Well nourished, well groomed in no acute distress; healthy appearing - moderately obese NECK: No JVD; No carotid bruits CARDIAC: Normal S1, S2; RRR, no murmurs, rubs, gallops RESPIRATORY:  Clear to auscultation without rales, wheezing or rhonchi ; nonlabored, good air movement. ABDOMEN: Soft, non-tender, non-distended EXTREMITIES:  No edema; No deformity     ASSESSMENT AND PLAN: .    Problem List Items Addressed This Visit       Cardiology Problems   Hyperlipidemia associated with type 2 diabetes mellitus (HCC) (Chronic)   Lipids were checked in March with an LDL 68.  Pretty well-controlled for risk factors on current dose of rosuvastatin  40 mg daily.  Further risk stratification with Coronary CTA      Relevant Orders   ECHOCARDIOGRAM COMPLETE   Hypertension associated with type 2 diabetes mellitus (HCC) (Chronic)   Long-standing hypertension managed with lisinopril -hydrochlorothiazide  (12-12.5 mg) . Positional dizziness possibly related to antihypertensive therapy and Mounjaro use. - Advise on maintaining adequate hydration. - BP well controlled today - but with ~ orthostatic Sx, may need to d/c hydrochlorothiazide  component  Borderline diabetes A1c of 5.6 indicates good glycemic control. Mounjaro may contribute to  dehydration and dizziness. - Advise on maintaining adequate hydration.      Relevant Orders   ECHOCARDIOGRAM COMPLETE     Other   Dyspnea on exertion   Intermittent dyspnea with dizziness. Normal BNP and EKG.  Echocardiogram and CT scan planned to assess for structural heart disease and coronary artery disease. - Order echocardiogram to assess heart function. - Order CT scan of coronary arteries to evaluate for coronary artery disease.      Relevant Orders   CT CORONARY MORPH W/CTA COR W/SCORE W/CA W/CM &/OR WO/CM   ECHOCARDIOGRAM COMPLETE   OSA (obstructive sleep apnea) (Chronic)   Relevant Orders   ECHOCARDIOGRAM COMPLETE   Precordial pain - Primary   Mild left-sided chest discomfort with dyspnea noted with exertion.  CT scan of coronary arteries planned to evaluate coronary anatomy and potential blockages. - Order CT  scan of coronary arteries to evaluate for coronary artery disease.      Relevant Orders   CT CORONARY MORPH W/CTA COR W/SCORE W/CA W/CM &/OR WO/CM   ECHOCARDIOGRAM COMPLETE   Vitamin D  deficiency         Follow-Up: Return in about 2 months (around 07/25/2024) for Routine Follow-up after testing ~ 1-2 months, To discuss test results.    Signed, Arleen Lacer, MD, MS Randene Bustard, M.D., M.S. Interventional Chartered certified accountant  Pager # [phone]

## 2024-05-27 DIAGNOSIS — R0609 Other forms of dyspnea: Secondary | ICD-10-CM | POA: Insufficient documentation

## 2024-05-27 DIAGNOSIS — R072 Precordial pain: Secondary | ICD-10-CM | POA: Insufficient documentation

## 2024-05-27 NOTE — Assessment & Plan Note (Signed)
 Long-standing hypertension managed with lisinopril -hydrochlorothiazide  (12-12.5 mg) . Positional dizziness possibly related to antihypertensive therapy and Mounjaro use. - Advise on maintaining adequate hydration. - BP well controlled today - but with ~ orthostatic Sx, may need to d/c hydrochlorothiazide  component  Borderline diabetes A1c of 5.6 indicates good glycemic control. Mounjaro may contribute to dehydration and dizziness. - Advise on maintaining adequate hydration.

## 2024-05-27 NOTE — Assessment & Plan Note (Signed)
 Lipids were checked in March with an LDL 68.  Pretty well-controlled for risk factors on current dose of rosuvastatin  40 mg daily.  Further risk stratification with Coronary CTA

## 2024-05-27 NOTE — Assessment & Plan Note (Signed)
 Intermittent dyspnea with dizziness. Normal BNP and EKG.  Echocardiogram and CT scan planned to assess for structural heart disease and coronary artery disease. - Order echocardiogram to assess heart function. - Order CT scan of coronary arteries to evaluate for coronary artery disease.

## 2024-05-27 NOTE — Assessment & Plan Note (Signed)
 Mild left-sided chest discomfort with dyspnea noted with exertion.  CT scan of coronary arteries planned to evaluate coronary anatomy and potential blockages. - Order CT scan of coronary arteries to evaluate for coronary artery disease.

## 2024-06-02 ENCOUNTER — Encounter (HOSPITAL_COMMUNITY): Payer: Self-pay

## 2024-06-04 ENCOUNTER — Ambulatory Visit (HOSPITAL_COMMUNITY)
Admission: RE | Admit: 2024-06-04 | Discharge: 2024-06-04 | Disposition: A | Discharge status: Home / Self Care | Source: Ambulatory Visit | Attending: Cardiology | Admitting: Cardiology

## 2024-06-04 DIAGNOSIS — R072 Precordial pain: Secondary | ICD-10-CM

## 2024-06-04 DIAGNOSIS — R0609 Other forms of dyspnea: Secondary | ICD-10-CM | POA: Diagnosis not present

## 2024-06-04 DIAGNOSIS — I2510 Atherosclerotic heart disease of native coronary artery without angina pectoris: Secondary | ICD-10-CM | POA: Insufficient documentation

## 2024-06-04 MED ORDER — IOHEXOL 350 MG/ML SOLN: 100.0000 mL | Freq: Once | INTRAVENOUS | Status: AC | PRN

## 2024-06-04 MED ORDER — DILTIAZEM HCL 25 MG/5ML IV SOLN
10.0000 mg | INTRAVENOUS | Status: DC | PRN
Start: 2024-06-04 — End: 2024-06-05

## 2024-06-04 MED ORDER — NITROGLYCERIN 0.4 MG SL SUBL
SUBLINGUAL_TABLET | SUBLINGUAL | Status: AC
  Filled 2024-06-04: qty 28

## 2024-06-04 MED ORDER — NITROGLYCERIN 0.4 MG SL SUBL
0.8000 mg | SUBLINGUAL_TABLET | Freq: Once | SUBLINGUAL | Status: AC
  Administered 2024-06-04: 0.8 mg via SUBLINGUAL

## 2024-06-04 MED ORDER — METOPROLOL TARTRATE 5 MG/5ML IV SOLN: 10.0000 mg | Freq: Once | INTRAVENOUS | Status: DC | PRN

## 2024-06-05 ENCOUNTER — Ambulatory Visit: Payer: Self-pay | Admitting: Cardiology

## 2024-06-16 HISTORY — PX: OTHER SURGICAL HISTORY: SHX169

## 2024-06-17 ENCOUNTER — Other Ambulatory Visit: Payer: Self-pay | Admitting: Family Medicine

## 2024-06-17 DIAGNOSIS — E1169 Type 2 diabetes mellitus with other specified complication: Secondary | ICD-10-CM

## 2024-06-17 DIAGNOSIS — F900 Attention-deficit hyperactivity disorder, predominantly inattentive type: Secondary | ICD-10-CM

## 2024-06-17 DIAGNOSIS — E1159 Type 2 diabetes mellitus with other circulatory complications: Secondary | ICD-10-CM

## 2024-06-29 ENCOUNTER — Ambulatory Visit: Admitting: Family Medicine

## 2024-06-29 ENCOUNTER — Encounter: Payer: Self-pay | Admitting: Family Medicine

## 2024-06-29 VITALS — BP 130/80 | HR 68 | Wt 233.4 lb

## 2024-06-29 DIAGNOSIS — E66812 Obesity, class 2: Secondary | ICD-10-CM | POA: Diagnosis not present

## 2024-06-29 DIAGNOSIS — E1165 Type 2 diabetes mellitus with hyperglycemia: Secondary | ICD-10-CM | POA: Diagnosis not present

## 2024-06-29 DIAGNOSIS — E1169 Type 2 diabetes mellitus with other specified complication: Secondary | ICD-10-CM | POA: Diagnosis not present

## 2024-06-29 DIAGNOSIS — I152 Hypertension secondary to endocrine disorders: Secondary | ICD-10-CM

## 2024-06-29 DIAGNOSIS — E785 Hyperlipidemia, unspecified: Secondary | ICD-10-CM

## 2024-06-29 DIAGNOSIS — G4733 Obstructive sleep apnea (adult) (pediatric): Secondary | ICD-10-CM

## 2024-06-29 DIAGNOSIS — E1159 Type 2 diabetes mellitus with other circulatory complications: Secondary | ICD-10-CM

## 2024-06-29 LAB — POCT GLYCOSYLATED HEMOGLOBIN (HGB A1C): Hemoglobin A1C: 5.8 % — AB (ref 4.0–5.6)

## 2024-06-29 MED ORDER — TIRZEPATIDE 7.5 MG/0.5ML ~~LOC~~ SOAJ: 7.5000 mg | SUBCUTANEOUS | 1 refills | Status: DC

## 2024-06-29 NOTE — Patient Instructions (Signed)
 Hold on just leave a message on MyChart in a month or 2 if you have not lost any

## 2024-06-29 NOTE — Progress Notes (Signed)
   Subjective:    Patient ID: Russell Peters, male    DOB: 10-Nov-1966, 58 y.o.   MRN: [id number]  HPI He is here for a recheck.  He is now taking Mounjaro  5 mg.  He did have difficulty with Ozempic  causing constipation as well as reflux type symptoms.  He is doing much better on Mounjaro  and has noticed a decrease in food noise.  He has also had some cardiac issues and is seeing cardiology.  He is scheduled for an echocardiogram on July 16.  He continues on lisinopril /HCTZ as well as rosuvastatin .   Review of Systems     Objective:    Physical Exam Alert and in no distress otherwise not examined.  Hemoglobin A1c is 5.8 his weight was reviewed.       Assessment & Plan:  Type 2 diabetes mellitus with hyperglycemia, without long-term current use of insulin (HCC) - Plan: POCT glycosylated hemoglobin (Hb A1C), tirzepatide  (MOUNJARO ) 7.5 MG/0.5ML Pen  Obesity, Class II, BMI 35-39.9  Hypertension associated with type 2 diabetes mellitus (HCC)  Hyperlipidemia associated with type 2 diabetes mellitus (HCC) I will increase his Mounjaro  to see if we can help with his weight loss as his diabetes seems to be under pretty good control.

## 2024-07-07 ENCOUNTER — Ambulatory Visit (HOSPITAL_COMMUNITY)
Admission: RE | Admit: 2024-07-07 | Discharge: 2024-07-07 | Disposition: A | Discharge status: Home / Self Care | Source: Ambulatory Visit | Attending: Cardiovascular Disease | Admitting: Cardiovascular Disease

## 2024-07-07 DIAGNOSIS — G4733 Obstructive sleep apnea (adult) (pediatric): Secondary | ICD-10-CM | POA: Insufficient documentation

## 2024-07-07 DIAGNOSIS — E1169 Type 2 diabetes mellitus with other specified complication: Secondary | ICD-10-CM | POA: Insufficient documentation

## 2024-07-07 DIAGNOSIS — R0609 Other forms of dyspnea: Secondary | ICD-10-CM | POA: Insufficient documentation

## 2024-07-07 DIAGNOSIS — I77810 Thoracic aortic ectasia: Secondary | ICD-10-CM

## 2024-07-07 DIAGNOSIS — E1159 Type 2 diabetes mellitus with other circulatory complications: Secondary | ICD-10-CM | POA: Insufficient documentation

## 2024-07-07 DIAGNOSIS — R072 Precordial pain: Secondary | ICD-10-CM | POA: Diagnosis not present

## 2024-07-07 DIAGNOSIS — E785 Hyperlipidemia, unspecified: Secondary | ICD-10-CM | POA: Diagnosis not present

## 2024-07-07 DIAGNOSIS — I152 Hypertension secondary to endocrine disorders: Secondary | ICD-10-CM | POA: Insufficient documentation

## 2024-07-07 HISTORY — PX: TRANSTHORACIC ECHOCARDIOGRAM: SHX275

## 2024-07-07 LAB — ECHOCARDIOGRAM COMPLETE
Area-P 1/2: 3.34 cm2
S' Lateral: 3.5 cm

## 2024-07-15 ENCOUNTER — Other Ambulatory Visit (HOSPITAL_COMMUNITY): Payer: Self-pay

## 2024-08-09 ENCOUNTER — Ambulatory Visit: Attending: Cardiology | Admitting: Cardiology

## 2024-08-09 VITALS — BP 126/72 | HR 92 | Ht 69.0 in | Wt 229.0 lb

## 2024-08-09 DIAGNOSIS — I152 Hypertension secondary to endocrine disorders: Secondary | ICD-10-CM

## 2024-08-09 DIAGNOSIS — E1159 Type 2 diabetes mellitus with other circulatory complications: Secondary | ICD-10-CM | POA: Diagnosis not present

## 2024-08-09 DIAGNOSIS — E785 Hyperlipidemia, unspecified: Secondary | ICD-10-CM

## 2024-08-09 DIAGNOSIS — G4733 Obstructive sleep apnea (adult) (pediatric): Secondary | ICD-10-CM

## 2024-08-09 DIAGNOSIS — R072 Precordial pain: Secondary | ICD-10-CM | POA: Diagnosis not present

## 2024-08-09 DIAGNOSIS — R0609 Other forms of dyspnea: Secondary | ICD-10-CM

## 2024-08-09 DIAGNOSIS — R931 Abnormal findings on diagnostic imaging of heart and coronary circulation: Secondary | ICD-10-CM

## 2024-08-09 DIAGNOSIS — E1169 Type 2 diabetes mellitus with other specified complication: Secondary | ICD-10-CM

## 2024-08-09 DIAGNOSIS — E66812 Obesity, class 2: Secondary | ICD-10-CM

## 2024-08-09 DIAGNOSIS — I77810 Thoracic aortic ectasia: Secondary | ICD-10-CM

## 2024-08-09 NOTE — Patient Instructions (Signed)

## 2024-08-09 NOTE — Progress Notes (Addendum)
 Cardiology Office Note:  .   Date:  08/14/2024  ID:  Russell Peters, Russell Peters [birth year], MRN [id number] PCP: Joyce Norleen BROCKS, MD   [facility] Providers Cardiologist:  Alm Clay, MD     Chief Complaint  Patient presents with   Follow-up    To discuss test results.  Overall feeling better.    Patient Profile: .     Russell Peters is a obese  58 y.o. male (distant former smoker) with a PMH notable for DM-2, HTN, HLD who presents here for 2 month f/u to discuss test results at the request of Joyce Norleen BROCKS, MD.     Russell Peters was seen for initial consultation on 05/25/2024 for evaluation of dyspnea and chest discomfort at the request of Dr. Joyce.  He noticed worsening exertional dyspnea over the last 2 and half weeks-noted with gardening or routine activities.  He described not being to get enough air despite not being doing anything strenuous.  Episodes would last several hours even while resting.  No chest pain or pressure but did note a tightness sensation when he was having difficulty breathing.SABRA  He was concerned because his father had a heart attack at 53 and ended up having bypass and valve surgery.  He also had a paternal uncle and father who had heart attacks at early ages both dying at young age.  He was evaluated with an Echocardiogram and Coronary CTA, and now presents to discuss test results.  Subjective  Discussed the use of AI scribe software for clinical note transcription with the patient, who gave verbal consent to proceed.  History of Present Illness Russell Peters is a 58 year old male with diabetes, hypertension, and hyperlipidemia who presents with shortness of breath on exertion.  He has a history of diabetes, hypertension, and hyperlipidemia. Current medications include Crestor  40 mg for cholesterol and lisinopril  HCTZ 20/12.5 mg for blood pressure. Recent lab results from March show well-controlled cholesterol levels and an A1c of 5.8,  indicating prediabetes.  He denies current smoking but has a history of smoking in the past. He experiences episodes of anxiety, described as feeling like he 'can't catch his breath,' particularly during stressful situations such as driving long distances. These episodes resolve on their own and are not associated with exertion.  He is not having the notable episodes that he was having prior to evaluation in May.  His family history includes his wife having atrial fibrillation, but he denies any symptoms suggestive of AFib himself. He mentions that his mother-in-law lives with him and requires significant care, contributing to his stress levels.   Cardiovascular ROS: positive for - dyspnea on exertion, rapid heart rate, shortness of breath, and these are brief episodes as noted above.  Usually associated with stress and anxiety and sense of feeling his heart going fast when it is not. negative for - chest pain, edema, irregular heartbeat, orthopnea, paroxysmal nocturnal dyspnea, or syncope or near syncope or TIA or amaurosis fugax, claudication  ROS:  Review of Systems - Negative except significant episodes of anxiety and stress; on Adderall for ADD    Objective   Medications - Crestor  40 mg - Lisinopril  HCTZ 20/12.5 - Mounjaro  7.5 mg weekly - Synthroid  88 mcg daily - Prilosec 20 mg daily - Adderall 30 mg twice daily  Social History - Tobacco: Former smoker (a long time ago) - Alcohol: Consumes alcohol once or twice a month. - Partner Status: Married - Living Situation: Lives  with spouse and mother-in-law.   Studies Reviewed: .        Results Results LABS Cholesterol panel/A1c:: Total cholesterol 140, HDL 57, LDL 67, triglycerides 77; Hgb A1c: 5.8 (02/2024) Hgb 17.5, PLT 295.  Cr 1.44, K+ 5.1, TSH 2.64, BNP 3.4 (05/18/2024)  RADIOLOGY Coronary CT Angiogram: Aorta 4.2 cm, Coronary Calcium  Score 104, mild nonobstructive disease (1-24%) in RCA and RPL V.  LAD and LCx as well as  LM had no disease.  (06/16/2024)  DIAGNOSTIC Echocardiogram: Ejection fraction 55-60%, no wall motion abnormalities, normal relaxation, normal left atrium size, normal right ventricle, normal aortic valve, Mildly Dilated Ascending Aorta-4.1 cm (07/07/2024)    Risk Assessment/Calculations:         Physical Exam:   VS:  BP 126/72   Pulse 92   Ht 5' 9 (1.753 m)   Wt 229 lb (103.9 kg)   SpO2 96%   BMI 33.82 kg/m    Wt Readings from Last 3 Encounters:  08/09/24 229 lb (103.9 kg)  06/29/24 233 lb 6.4 oz (105.9 kg)  05/25/24 239 lb 8 oz (108.6 kg)    Physical Exam GEN: Relatively healthy appearing.  Well nourished, well groomed in no acute distress; mildly obese NECK: No JVD; No carotid bruits CARDIAC: Normal S1, S2; RRR, no murmurs, rubs, gallops RESPIRATORY:  Clear to auscultation without rales, wheezing or rhonchi ; nonlabored, good air movement. ABDOMEN: Soft, non-tender, non-distended EXTREMITIES:  No edema; No deformity      ASSESSMENT AND PLAN: .    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 Mild nonobstructive disease with <24% narrowing in RCA.  Exertional dyspnea and chest tightness not likely related to CAD. Presence of CAD does warrant risk factor modification with blood pressure and lipid management.  Effective cholesterol management. - Continue current cholesterol management with rosuvastatin  40 mg. - Monitor cholesterol levels regularly.      Dilation of thoracic aorta (HCC) (Chronic)   Aortic dilation at 4.2 cm on CT and 4.1 cm on echocardiogram, above normal but not surgical threshold. - Per recommendation, would re-evaluate aortic size in one year with dedicated CTA aorta.  Can be evaluated by PCP.SABRA      Hyperlipidemia associated with type 2 diabetes mellitus (HCC) (Chronic)   Well-controlled with rosuvastatin  40 mg. Lipid levels within target range. -  Continue rosuvastatin  40 mg. - Monitor lipid levels regularly.  Prediabetes A1c of 5.8%. Managed with lifestyle modifications and monitoring but we recently started on Mounjaro  7.5 mg weekly;  - Continue Mounjaro  7.5 mg weekly. - Continue monitoring blood glucose levels.  - Maintain lifestyle modifications to manage blood sugar.      Hypertension associated with type 2 diabetes mellitus (HCC) - Primary (Chronic)   Blood pressure well-controlled on lisinopril  HCTZ 20/12.5 mg. - Continue current antihypertensive regimen. - Monitor blood pressure regularly.        Other   Dyspnea on exertion   Intermittent shortness of breath => pretty much normal cardiac evaluation with echocardiogram and Coronary CTA being unremarkable. Likely not cardiac in origin; possible anxiety-related episodes.      Obesity, Class II, BMI 35-39.9 (Chronic)   Discussed importance of increasing activity level with dietary modification.  He is now on Mounjaro .      OSA (obstructive sleep apnea) (Chronic)   Per PCP.  CPAP if indicated.      Precordial pain  With essentially low risk findings on Coronary CTA, not likely to be cardiac related.  Suspect this is probably just related to his exertional dyspnea with a sense of difficulty catching his breath.  Probably stress related.            Follow-Up: Return in about 1 year (around 08/09/2025) for Routine follow up with me -> to ensure CTA aorta ordered.SABRA Fus, Alm MICAEL Clay, MD, MS Alm Clay, M.D., M.S. Interventional Chartered certified accountant  Pager # [phone]

## 2024-08-14 ENCOUNTER — Encounter: Payer: Self-pay | Admitting: Cardiology

## 2024-08-14 DIAGNOSIS — R931 Abnormal findings on diagnostic imaging of heart and coronary circulation: Secondary | ICD-10-CM | POA: Insufficient documentation

## 2024-08-14 DIAGNOSIS — I77810 Thoracic aortic ectasia: Secondary | ICD-10-CM | POA: Insufficient documentation

## 2024-08-14 NOTE — Assessment & Plan Note (Addendum)
 With essentially low risk findings on Coronary CTA, not likely to be cardiac related.  Suspect this is probably just related to his exertional dyspnea with a sense of difficulty catching his breath.  Probably stress related.

## 2024-08-14 NOTE — Assessment & Plan Note (Signed)
 Per PCP.  CPAP if indicated.

## 2024-08-14 NOTE — Assessment & Plan Note (Signed)
 Discussed importance of increasing activity level with dietary modification.  He is now on Mounjaro .

## 2024-08-14 NOTE — Assessment & Plan Note (Signed)
 Blood pressure well-controlled on lisinopril  HCTZ 20/12.5 mg. - Continue current antihypertensive regimen. - Monitor blood pressure regularly.

## 2024-08-14 NOTE — Assessment & Plan Note (Signed)
 Aortic dilation at 4.2 cm on CT and 4.1 cm on echocardiogram, above normal but not surgical threshold. - Per recommendation, would re-evaluate aortic size in one year with dedicated CTA aorta.  Can be evaluated by PCP.SABRA

## 2024-08-14 NOTE — Assessment & Plan Note (Addendum)
 Well-controlled with rosuvastatin  40 mg. Lipid levels within target range. - Continue rosuvastatin  40 mg. - Monitor lipid levels regularly.  Prediabetes A1c of 5.8%. Managed with lifestyle modifications and monitoring but we recently started on Mounjaro  7.5 mg weekly;  - Continue Mounjaro  7.5 mg weekly. - Continue monitoring blood glucose levels.  - Maintain lifestyle modifications to manage blood sugar.

## 2024-08-14 NOTE — Assessment & Plan Note (Signed)
 Coronary calcium  score of 104. Mild nonobstructive disease with <24% narrowing in RCA.  Exertional dyspnea and chest tightness not likely related to CAD. Presence of CAD does warrant risk factor modification with blood pressure and lipid management.  Effective cholesterol management. - Continue current cholesterol management with rosuvastatin  40 mg. - Monitor cholesterol levels regularly.

## 2024-08-14 NOTE — Assessment & Plan Note (Addendum)
 Intermittent shortness of breath => pretty much normal cardiac evaluation with echocardiogram and Coronary CTA being unremarkable. Likely not cardiac in origin; possible anxiety-related episodes.

## 2024-08-27 ENCOUNTER — Other Ambulatory Visit: Payer: Self-pay | Admitting: Family Medicine

## 2024-08-27 DIAGNOSIS — F900 Attention-deficit hyperactivity disorder, predominantly inattentive type: Secondary | ICD-10-CM

## 2024-09-03 ENCOUNTER — Other Ambulatory Visit: Payer: Self-pay | Admitting: Family Medicine

## 2024-09-03 DIAGNOSIS — E039 Hypothyroidism, unspecified: Secondary | ICD-10-CM

## 2024-09-06 ENCOUNTER — Ambulatory Visit: Admitting: Cardiovascular Disease

## 2024-09-09 ENCOUNTER — Other Ambulatory Visit: Payer: Self-pay | Admitting: Family Medicine

## 2024-09-09 DIAGNOSIS — E039 Hypothyroidism, unspecified: Secondary | ICD-10-CM

## 2024-09-17 ENCOUNTER — Other Ambulatory Visit: Payer: Self-pay | Admitting: Family Medicine

## 2024-09-17 DIAGNOSIS — I152 Hypertension secondary to endocrine disorders: Secondary | ICD-10-CM

## 2024-09-17 DIAGNOSIS — E1169 Type 2 diabetes mellitus with other specified complication: Secondary | ICD-10-CM

## 2024-10-14 ENCOUNTER — Other Ambulatory Visit: Payer: Self-pay | Admitting: Family Medicine

## 2024-10-14 DIAGNOSIS — F900 Attention-deficit hyperactivity disorder, predominantly inattentive type: Secondary | ICD-10-CM

## 2024-10-14 MED ORDER — AMPHETAMINE-DEXTROAMPHETAMINE 30 MG PO TABS: 30.0000 mg | ORAL_TABLET | Freq: Every day | ORAL | 0 refills | Status: DC

## 2024-10-14 MED ORDER — AMPHETAMINE-DEXTROAMPHETAMINE 30 MG PO TABS: 1.0000 | ORAL_TABLET | Freq: Two times a day (BID) | ORAL | 0 refills | Status: AC

## 2024-11-10 ENCOUNTER — Encounter: Payer: Self-pay | Admitting: Family Medicine

## 2024-11-10 ENCOUNTER — Telehealth: Payer: Self-pay | Admitting: Family Medicine

## 2024-11-10 ENCOUNTER — Ambulatory Visit: Payer: Self-pay | Admitting: Family Medicine

## 2024-11-10 VITALS — BP 126/78 | HR 86 | Ht 69.0 in | Wt 235.8 lb

## 2024-11-10 DIAGNOSIS — E1169 Type 2 diabetes mellitus with other specified complication: Secondary | ICD-10-CM | POA: Diagnosis not present

## 2024-11-10 DIAGNOSIS — E1165 Type 2 diabetes mellitus with hyperglycemia: Secondary | ICD-10-CM | POA: Diagnosis not present

## 2024-11-10 DIAGNOSIS — E785 Hyperlipidemia, unspecified: Secondary | ICD-10-CM

## 2024-11-10 DIAGNOSIS — E66812 Obesity, class 2: Secondary | ICD-10-CM | POA: Diagnosis not present

## 2024-11-10 DIAGNOSIS — E1159 Type 2 diabetes mellitus with other circulatory complications: Secondary | ICD-10-CM | POA: Diagnosis not present

## 2024-11-10 DIAGNOSIS — I152 Hypertension secondary to endocrine disorders: Secondary | ICD-10-CM

## 2024-11-10 DIAGNOSIS — Z23 Encounter for immunization: Secondary | ICD-10-CM | POA: Diagnosis not present

## 2024-11-10 DIAGNOSIS — E1121 Type 2 diabetes mellitus with diabetic nephropathy: Secondary | ICD-10-CM

## 2024-11-10 DIAGNOSIS — G4733 Obstructive sleep apnea (adult) (pediatric): Secondary | ICD-10-CM

## 2024-11-10 LAB — POCT GLYCOSYLATED HEMOGLOBIN (HGB A1C): Hemoglobin A1C: 5.6 % (ref 4.0–5.6)

## 2024-11-10 MED ORDER — TIRZEPATIDE 10 MG/0.5ML ~~LOC~~ SOAJ
10.0000 mg | SUBCUTANEOUS | 1 refills | Status: AC
Start: 2024-11-10 — End: ?

## 2024-11-10 NOTE — Progress Notes (Signed)
   Subjective:    Patient ID: Russell Peters, male    DOB: Jul 26, 1966, 58 y.o.   MRN: [id number]  Discussed the use of AI scribe software for clinical note transcription with the patient, who gave verbal consent to proceed.  History of Present Illness   Russell Peters is a 58 year old male with diabetes and hypertension who presents for a routine follow-up.  He is currently using Mounjaro  for diabetes management, which he finds has fewer side effects compared to Ozempic . Russell Peters He is still focusing on weight loss. He does have underlying OSA and is doing well His current medication regimen is effective without any side effects. He is taking Adderall, which is working well, and he is not experiencing any adverse effects. He continues on his blood pressure medication, lisinopril /HCTZ, and Crestor  for cholesterol management. His thyroid  medication is also not causing any issues.  There is a noted history of slight changes in kidney function, which is being monitored.           Review of Systems     Objective:    Physical Exam  Alert and in no distress.  Hemoglobin A1c is 5.6    .       Assessment & Plan:      Type 2 diabetes mellitus Well-controlled with A1c of 5.6. Mounjaro  effective with fewer side effects than Ozempic . - Continue Mounjaro  for diabetes management.  Obesity, class 2 Mounjaro  aiding in weight loss. Focus on weight loss as diabetes is well-managed. - Continue Mounjaro  but increase it to 10 mg for weight loss. - Report weight loss progress in 2-3 months via MyChart.  General Health Maintenance Immunizations were due.     Hypertension associated with type 2 diabetes mellitus (HCC)  Hyperlipidemia associated with type 2 diabetes mellitus (HCC)  OSA (obstructive sleep apnea)  Diabetic nephropathy associated with type 2 diabetes mellitus (HCC)  Need for influenza vaccination - Plan: Flu vaccine trivalent PF, 6mos and  older(Flulaval,Afluria,Fluarix,Fluzone)  Need for COVID-19 vaccine - Plan: Pfizer Comirnaty Covid -19 Vaccine 44yrs and older

## 2024-11-10 NOTE — Telephone Encounter (Signed)
 Russell Peters said to make sure his Monjoura went up to next dose and send to [facility].

## 2024-11-22 DIAGNOSIS — H40013 Open angle with borderline findings, low risk, bilateral: Secondary | ICD-10-CM | POA: Diagnosis not present

## 2024-11-22 DIAGNOSIS — E119 Type 2 diabetes mellitus without complications: Secondary | ICD-10-CM | POA: Diagnosis not present

## 2024-11-22 DIAGNOSIS — H2513 Age-related nuclear cataract, bilateral: Secondary | ICD-10-CM | POA: Diagnosis not present

## 2024-11-22 LAB — OPHTHALMOLOGY REPORT-SCANNED

## 2024-12-11 ENCOUNTER — Other Ambulatory Visit: Payer: Self-pay | Admitting: Family Medicine

## 2024-12-11 DIAGNOSIS — F900 Attention-deficit hyperactivity disorder, predominantly inattentive type: Secondary | ICD-10-CM

## 2024-12-11 DIAGNOSIS — E039 Hypothyroidism, unspecified: Secondary | ICD-10-CM

## 2024-12-25 ENCOUNTER — Other Ambulatory Visit: Payer: Self-pay | Admitting: Family Medicine

## 2024-12-25 DIAGNOSIS — E1159 Type 2 diabetes mellitus with other circulatory complications: Secondary | ICD-10-CM

## 2024-12-25 DIAGNOSIS — E1169 Type 2 diabetes mellitus with other specified complication: Secondary | ICD-10-CM

## 2024-12-28 ENCOUNTER — Ambulatory Visit: Admitting: Gastroenterology

## 2024-12-28 ENCOUNTER — Other Ambulatory Visit

## 2024-12-28 ENCOUNTER — Ambulatory Visit: Payer: Self-pay | Admitting: Gastroenterology

## 2024-12-28 ENCOUNTER — Encounter: Payer: Self-pay | Admitting: Gastroenterology

## 2024-12-28 VITALS — BP 154/100 | HR 90 | Ht 69.0 in | Wt 240.3 lb

## 2024-12-28 DIAGNOSIS — K625 Hemorrhage of anus and rectum: Secondary | ICD-10-CM | POA: Diagnosis not present

## 2024-12-28 DIAGNOSIS — K648 Other hemorrhoids: Secondary | ICD-10-CM

## 2024-12-28 LAB — CBC WITH DIFFERENTIAL/PLATELET
Basophils Absolute: 0 K/uL (ref 0.0–0.1)
Basophils Relative: 0.2 % (ref 0.0–3.0)
Eosinophils Absolute: 0.2 K/uL (ref 0.0–0.7)
Eosinophils Relative: 2.6 % (ref 0.0–5.0)
HCT: 43.4 % (ref 39.0–52.0)
Hemoglobin: 14.8 g/dL (ref 13.0–17.0)
Lymphocytes Relative: 25.6 % (ref 12.0–46.0)
Lymphs Abs: 1.7 K/uL (ref 0.7–4.0)
MCHC: 34.2 g/dL (ref 30.0–36.0)
MCV: 93.8 fl (ref 78.0–100.0)
Monocytes Absolute: 0.6 K/uL (ref 0.1–1.0)
Monocytes Relative: 9.6 % (ref 3.0–12.0)
Neutro Abs: 4.1 K/uL (ref 1.4–7.7)
Neutrophils Relative %: 62 % (ref 43.0–77.0)
Platelets: 231 K/uL (ref 150.0–400.0)
RBC: 4.63 Mil/uL (ref 4.22–5.81)
RDW: 13.3 % (ref 11.5–15.5)
WBC: 6.7 K/uL (ref 4.0–10.5)

## 2024-12-28 LAB — BASIC METABOLIC PANEL WITH GFR
BUN: 13 mg/dL (ref 6–23)
CO2: 28 meq/L (ref 19–32)
Calcium: 9.3 mg/dL (ref 8.4–10.5)
Chloride: 104 meq/L (ref 96–112)
Creatinine, Ser: 1.06 mg/dL (ref 0.40–1.50)
GFR: 78.1 mL/min
Glucose, Bld: 112 mg/dL — ABNORMAL HIGH (ref 70–99)
Potassium: 4.6 meq/L (ref 3.5–5.1)
Sodium: 136 meq/L (ref 135–145)

## 2024-12-28 MED ORDER — HYDROCORTISONE ACETATE 25 MG RE SUPP: 25.0000 mg | Freq: Every evening | RECTAL | 0 refills | Status: AC

## 2024-12-28 NOTE — Patient Instructions (Signed)
 We have sent the following medications to your pharmacy for you to pick up at your convenience: Anusol  Suppositories 25 mg  Use 1 at night for 12 days  Call the office if you have new or worsening symptoms  Please go to the lab in the basement of our building to have lab work done as you leave today. Hit B for basement when you get on the elevator.  When the doors open the lab is on your left.  We will call you with the results. Thank you.  You have been scheduled for a Hemorrhoid Banding procedure with Dr. Legrand on Wednesday January 28th at 10:00 am. If you have any question comments or concerns please do not hesitate to give us  a call.  _______________________________________________________  If your blood pressure at your visit was 140/90 or greater, please contact your primary care physician to follow up on this.  _______________________________________________________  If you are age 76 or older, your body mass index should be between 23-30. Your Body mass index is 35.49 kg/m. If this is out of the aforementioned range listed, please consider follow up with your Primary Care Provider.  If you are age 50 or younger, your body mass index should be between 19-25. Your Body mass index is 35.49 kg/m. If this is out of the aformentioned range listed, please consider follow up with your Primary Care Provider.   ________________________________________________________  The [facility] providers would like to encourage you to use MYCHART to communicate with providers for non-urgent requests or questions.  Due to long hold times on the telephone, sending your provider a message by [facility] may be a faster and more efficient way to get a response.  Please allow 48 business hours for a response.  Please remember that this is for non-urgent requests.  _______________________________________________________  [facility] is using a team-based approach to care.  Your team is made up of  your doctor and two to three APPS. Our APPS (Nurse Practitioners and Physician Assistants) work with your physician to ensure care continuity for you. They are fully qualified to address your health concerns and develop a treatment plan. They communicate directly with your gastroenterologist to care for you. Seeing the Advanced Practice Practitioners on your physician's team can help you by facilitating care more promptly, often allowing for earlier appointments, access to diagnostic testing, procedures, and other specialty referrals.   Due to recent changes in healthcare laws, you may see the results of your imaging and laboratory studies on MyChart before your provider has had a chance to review them.  We understand that in some cases there may be results that are confusing or concerning to you. Not all laboratory results come back in the same time frame and the provider may be waiting for multiple results in order to interpret others.  Please give us  48 hours in order for your provider to thoroughly review all the results before contacting the office for clarification of your results.

## 2024-12-28 NOTE — Progress Notes (Signed)
 "  Russell Peters [id number] [birth year]   Chief Complaint: Rectal bleeding  Referring Provider: Joyce Norleen BROCKS, MD Primary GI MD: Dr. Legrand  HPI: Russell Peters is a 59 y.o. male with past medical history of ADD, anxiety, depression, CKD, diabetes, GERD, HLD, HTN, sleep apnea who presents today for a complaint of hemorrhoids.    Patient last seen in office 07/22/2023 by Dr. Legrand for hemorrhoid banding.  Had 2 subsequent banding procedure visits August and September 2024.   Discussed the use of AI scribe software for clinical note transcription with the patient, who gave verbal consent to proceed.  History of Present Illness Russell Peters Russell Peters is a 59 year old male with internal hemorrhoids status post banding who presents with acute rectal bleeding.  Rectal bleeding - Acute onset of rectal bleeding began six days prior to visit - Large volume of blood present in the toilet with every bowel movement since onset - No reduction in bleeding since onset - Blood present throughout the toilet, not just in the stool - No bleeding occurs outside of bowel movements - No prior episodes of rectal bleeding of this severity with hemorrhoids  Hemorrhoidal disease - History of internal hemorrhoids with three banding procedures in 2024 - No rectal bleeding since last banding until current episode - No use of suppositories for hemorrhoids at home  Gastrointestinal symptoms - Bowel movements are daily and unchanged in consistency - No diarrhea, constipation, straining, or hard stools - Frequency of bowel movements decreased to once daily since starting Mounjaro  10 mg one month ago (previously multiple times per day) - Occasional bloating associated with Mounjaro  use  Associated symptoms - No abdominal pain - No rectal pain - Mild intermittent lightheadedness and dizziness, unclear whether due to blood loss - No syncopal events - No new chest pain or shortness of  breath  Hypertension and anxiety - History of hypertension, taking antihypertensive medications as prescribed - Mild anxiety today   Previous GI Procedures/Imaging   Colonoscopy 05/22/2023 - Four 4 to 6 mm polyps in the transverse colon and at the hepatic flexure, removed with a cold snare. Resected and retrieved.  - Redundant colon.  - One diminutive polyp in the transverse colon, removed with a cold snare. Resected and retrieved.  - Internal hemorrhoids.  - The examination was otherwise normal on direct and retroflexion views. - Recall 3 years Path: 1. Surgical [P], colon, transverse and hepatic flexure, polyp (4)  - TUBULAR ADENOMA, FRAGMENTS.  2. Surgical [P], colon, transverse, polyp (1) - TUBULAR ADENOMA, FRAGMENTS.   Past Medical History:  Diagnosis Date   ADD (attention deficit disorder)    Allergy    Anxiety    Chronic kidney disease    kidney stone years ago   Depression    Diabetes mellitus without complication (HCC)    GERD (gastroesophageal reflux disease)    Hyperlipidemia    Hypertension    Sleep apnea    wears Cpap   Thyroid  disease     Past Surgical History:  Procedure Laterality Date   CORONARY CT ANGIOGRAPHY  06/16/2024   Aorta 4.2 cm, Coronary Calcium  Score 104, mild nonobstructive disease (1-24%) in RCA and RPL V.  LAD and LCx as well as LM had no disease   NECK SURGERY     NO PAST SURGERIES     PUNCH BIOPSY OF SKIN N/A 01/03/2022   picker's nodule   SKIN BIOPSY Bilateral 11/20/2021   prurio nodularis and scar of the  scalp   SKIN BIOPSY Left 01/03/2022   neurofibroma   TRANSTHORACIC ECHOCARDIOGRAM  07/07/2024   Ejection fraction 55-60%, no wall motion abnormalities, normal relaxation, normal left atrium size, normal right ventricle, normal aortic valve, Mildly Dilated Ascending Aorta-4.1 cm    Current Outpatient Medications  Medication Sig Dispense Refill   Accu-Chek Softclix Lancets lancets 1 each by Other route daily. Use as instructed to  check blood sugar 100 each 3   amphetamine -dextroamphetamine  (ADDERALL) 30 MG tablet Take 1 tablet by mouth 2 (two) times daily. 30 tablet 0   clobetasol (TEMOVATE) 0.05 % external solution Apply 1 Application topically as needed.     fluocinolone (SYNALAR) 0.01 % external solution      glucose blood test strip 1 each by Other route as needed for other. Use as instructed to check blood sugar 100 each 3   levothyroxine  (SYNTHROID ) 88 MCG tablet TAKE 1 TABLET BY MOUTH DAILY. 90 tablet 0   lisinopril -hydrochlorothiazide  (ZESTORETIC ) 20-12.5 MG tablet TAKE 1 TABLET BY MOUTH DAILY. 30 tablet 2   omeprazole  (PRILOSEC) 20 MG capsule Take 1 capsule (20 mg total) by mouth daily.     rosuvastatin  (CRESTOR ) 40 MG tablet TAKE 1 TABLET BY MOUTH DAILY. 30 tablet 2   tirzepatide  (MOUNJARO ) 10 MG/0.5ML Pen Inject 10 mg into the skin once a week. 6 mL 1   No current facility-administered medications for this visit.    Allergies as of 12/28/2024 - Review Complete 12/28/2024  Allergen Reaction Noted   Penicillins  10/20/2012    Family History  Problem Relation Age of Onset   Rheum arthritis Mother    Esophageal cancer Father    Colon cancer Maternal Grandfather    Rectal cancer Neg Hx    Stomach cancer Neg Hx    Colon polyps Neg Hx     Social History[1]   Review of Systems:    Constitutional: No weight loss, fever, chills Cardiovascular: No chest pain Respiratory: No SOB  Gastrointestinal: See HPI and otherwise negative Neurological: Mild intermittent dizziness, no syncope   Physical Exam:  Vital signs: BP (!) 152/100   Pulse 90   Ht 5' 9 (1.753 m)   Wt 240 lb 4.8 oz (109 kg)   BMI 35.49 kg/m   Wt Readings from Last 3 Encounters:  12/28/24 240 lb 4.8 oz (109 kg)  11/10/24 235 lb 12.8 oz (107 kg)  08/09/24 229 lb (103.9 kg)     Constitutional: Pleasant, well-appearing male ED, alert and cooperative Head:  Normocephalic and atraumatic.  Respiratory: Respirations even and unlabored.  Lungs clear to auscultation bilaterally.  No wheezes, crackles, or rhonchi.  Cardiovascular:  Regular rate and rhythm. No murmurs. No peripheral edema. Gastrointestinal:  Soft, nondistended, nontender. No rebound or guarding. Normal bowel sounds. No appreciable masses or hepatomegaly. Rectal: No external hemorrhoids or obvious anal fissures. Actively bleeding internal hemorrhoids on anoscopy, slightly prolapsed but reduce spontaneously. Neurologic:  Alert and oriented x4;  grossly normal neurologically.  Skin:   Dry and intact without significant lesions or rashes. Psychiatric: Oriented to person, place and time. Demonstrates good judgement and reason without abnormal affect or behaviors.   Assessment/Plan:   Assessment & Plan Hemorrhoids with active bleeding Patient with history of internal hemorrhoids s/p banding in 2024.  Has not had any rectal bleeding until last week.  Has been having significant bleeding with bowel movements for about 6 days.  Has actively bleeding internal hemorrhoids on anoscopy today. Did recently start Mounjaro  and though has not  had straining or constipation, has had decreased frequency of bowel movements, possibly contributing.  We will treat with suppositories, check a CBC today to ensure no anemia, and schedule repeat hemorrhoid banding.  - Labs today: CBC - Prescribed Anusol  suppositories, use nightly for 12 days - Scheduled hemorrhoid banding  - Instructed to seek emergency care if symptoms worsen.  Hypertension Chronic hypertension with elevated blood pressure.  Patient taking medications as prescribed.  Feels anxious today and attributes high blood pressure to this.  Advised to continue monitoring.  Denies headaches, chest pain, shortness of breath.     Camie Furbish, PA-C [facility] 12/28/2024, 9:58 AM  Patient Care Team: Joyce Norleen BROCKS, MD as PCP - General (Family Medicine) Anner Alm ORN, MD as PCP - Cardiology (Cardiology)        [1]  Social History Tobacco Use   Smoking status: Former    Current packs/day: 0.00    Types: Cigarettes    Start date: 12/23/1993    Quit date: 12/23/1998    Years since quitting: 26.0   Smokeless tobacco: Never  Vaping Use   Vaping status: Never Used  Substance Use Topics   Alcohol use: Yes    Comment: 3 beers weekly in past 8 months; 1 pt weekly liquor   Drug use: No   "

## 2024-12-31 NOTE — Progress Notes (Signed)
 ____________________________________________________________  Attending physician addendum:  Thank you for sending this case to me. I have reviewed the entire note and agree with the plan.  Scheduled for banding with me in office 01/19/25  Victory Brand, MD  ____________________________________________________________

## 2025-01-19 ENCOUNTER — Ambulatory Visit: Admitting: Gastroenterology

## 2025-01-19 ENCOUNTER — Encounter: Payer: Self-pay | Admitting: Gastroenterology

## 2025-01-19 VITALS — BP 128/84 | HR 84 | Ht 69.0 in | Wt 232.2 lb

## 2025-01-19 DIAGNOSIS — K641 Second degree hemorrhoids: Secondary | ICD-10-CM

## 2025-01-19 DIAGNOSIS — K648 Other hemorrhoids: Secondary | ICD-10-CM

## 2025-01-19 NOTE — Patient Instructions (Signed)

## 2025-01-19 NOTE — Progress Notes (Signed)
 Return to bleeding as outlined in 12/28/2024 APP office note  Anoscopy today reveals swollen right sided internal hemorrhoid plexus extending from about 10:00 to 1 o'clock position  PROCEDURE NOTE: The patient presents with symptomatic grade 2  hemorrhoids, requesting rubber band ligation of his/her hemorrhoidal disease.  All risks, benefits and alternative forms of therapy were described and informed consent was obtained.    The anorectum was pre-medicated with 0.125% NTG and lubricant. The decision was made to band the RP internal hemorrhoids, and the [facility] ORegan System was used to perform band ligation without complication.  Digital anorectal examination was then performed to assure proper positioning of the band, and to adjust the banded tissue as required.  The patient was discharged home without pain or other issues.  Dietary and behavioral recommendations were given and along with follow-up instructions.     The following adjunctive treatments were recommended:  None (reports moving bowels regularly without straining)  The patient will return 3 to 4 weeks for  follow-up and possible additional banding as required. No complications were encountered and the patient tolerated the procedure well.  GL[place]WO[place] Victory Brand, MD

## 2025-02-22 ENCOUNTER — Encounter: Admitting: Gastroenterology

## 2025-03-01 ENCOUNTER — Encounter: Payer: Self-pay | Admitting: Family Medicine
